# Patient Record
Sex: Male | Born: 1965
Health system: Southern US, Community
[De-identification: ages and names within clinical notes are randomized; demographics above are authoritative.]

## PROBLEM LIST (undated history)

## (undated) DIAGNOSIS — F32A Depression, unspecified: Secondary | ICD-10-CM

## (undated) DIAGNOSIS — M549 Dorsalgia, unspecified: Secondary | ICD-10-CM

## (undated) DIAGNOSIS — N401 Enlarged prostate with lower urinary tract symptoms: Secondary | ICD-10-CM

## (undated) DIAGNOSIS — M545 Low back pain, unspecified: Secondary | ICD-10-CM

## (undated) DIAGNOSIS — M1990 Unspecified osteoarthritis, unspecified site: Secondary | ICD-10-CM

## (undated) DIAGNOSIS — G8929 Other chronic pain: Secondary | ICD-10-CM

## (undated) DIAGNOSIS — K649 Unspecified hemorrhoids: Secondary | ICD-10-CM

## (undated) DIAGNOSIS — F1120 Opioid dependence, uncomplicated: Secondary | ICD-10-CM

## (undated) DIAGNOSIS — F329 Major depressive disorder, single episode, unspecified: Secondary | ICD-10-CM

## (undated) DIAGNOSIS — R7303 Prediabetes: Secondary | ICD-10-CM

## (undated) DIAGNOSIS — D369 Benign neoplasm, unspecified site: Secondary | ICD-10-CM

## (undated) DIAGNOSIS — F419 Anxiety disorder, unspecified: Secondary | ICD-10-CM

## (undated) DIAGNOSIS — I10 Essential (primary) hypertension: Secondary | ICD-10-CM

## (undated) DIAGNOSIS — G5601 Carpal tunnel syndrome, right upper limb: Secondary | ICD-10-CM

## (undated) DIAGNOSIS — K635 Polyp of colon: Secondary | ICD-10-CM

## (undated) HISTORY — DX: Essential (primary) hypertension: I10

## (undated) HISTORY — DX: Benign prostatic hyperplasia with lower urinary tract symptoms: N40.1

## (undated) HISTORY — DX: Low back pain, unspecified: M54.50

## (undated) HISTORY — DX: Major depressive disorder, single episode, unspecified: F32.9

## (undated) HISTORY — DX: Benign neoplasm, unspecified site: D36.9

## (undated) HISTORY — DX: Polyp of colon: K63.5

## (undated) HISTORY — PX: COLONOSCOPY: SHX174

## (undated) HISTORY — DX: Unspecified osteoarthritis, unspecified site: M19.90

## (undated) HISTORY — DX: Unspecified hemorrhoids: K64.9

## (undated) HISTORY — DX: Carpal tunnel syndrome, right upper limb: G56.01

---
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
## 1998-11-23 ENCOUNTER — Emergency Department (HOSPITAL_COMMUNITY): Admission: EM | Admit: 1998-11-23 | Discharge: 1998-11-23 | Payer: Self-pay | Admitting: Emergency Medicine

## 2002-07-14 ENCOUNTER — Emergency Department (HOSPITAL_COMMUNITY): Admission: EM | Admit: 2002-07-14 | Discharge: 2002-07-14 | Payer: Self-pay | Admitting: Emergency Medicine

## 2003-02-18 ENCOUNTER — Emergency Department (HOSPITAL_COMMUNITY): Admission: EM | Admit: 2003-02-18 | Discharge: 2003-02-18 | Payer: Self-pay | Admitting: Emergency Medicine

## 2003-07-11 ENCOUNTER — Emergency Department (HOSPITAL_COMMUNITY): Admission: EM | Admit: 2003-07-11 | Discharge: 2003-07-11 | Payer: Self-pay | Admitting: Emergency Medicine

## 2006-10-22 ENCOUNTER — Encounter
Admission: RE | Admit: 2006-10-22 | Discharge: 2006-10-22 | Payer: Self-pay | Admitting: Physical Medicine and Rehabilitation

## 2007-01-15 HISTORY — PX: CERVICAL SPINE SURGERY: SHX589

## 2007-11-09 ENCOUNTER — Encounter: Admission: RE | Admit: 2007-11-09 | Discharge: 2007-11-09 | Payer: Self-pay | Admitting: Neurological Surgery

## 2008-01-12 ENCOUNTER — Ambulatory Visit (HOSPITAL_COMMUNITY): Admission: RE | Admit: 2008-01-12 | Discharge: 2008-01-13 | Payer: Self-pay | Admitting: Neurological Surgery

## 2008-02-15 ENCOUNTER — Encounter: Admission: RE | Admit: 2008-02-15 | Discharge: 2008-02-15 | Payer: Self-pay | Admitting: Neurological Surgery

## 2008-05-28 ENCOUNTER — Emergency Department (HOSPITAL_COMMUNITY): Admission: EM | Admit: 2008-05-28 | Discharge: 2008-05-28 | Payer: Self-pay | Admitting: Emergency Medicine

## 2010-01-14 HISTORY — PX: FOOT SURGERY: SHX648

## 2010-02-04 ENCOUNTER — Emergency Department (HOSPITAL_COMMUNITY)
Admission: EM | Admit: 2010-02-04 | Discharge: 2010-02-04 | Payer: Self-pay | Source: Home / Self Care | Admitting: Emergency Medicine

## 2010-04-24 LAB — URINALYSIS, ROUTINE W REFLEX MICROSCOPIC
Nitrite: NEGATIVE
Protein, ur: NEGATIVE mg/dL
Urobilinogen, UA: 0.2 mg/dL (ref 0.0–1.0)

## 2010-04-24 LAB — URINE MICROSCOPIC-ADD ON

## 2010-05-29 NOTE — Op Note (Signed)
NAME:  Benjamin Solomon, HOGLUND NO.:  [id number]   MEDICAL RECORD NO.:  [id number]          PATIENT TYPE:  OIB   LOCATION:  3534                         FACILITY:  [facility]   PHYSICIAN:  Tia Alert, MD     DATE OF BIRTH:  May 30, 1965   DATE OF PROCEDURE:  01/12/2008  DATE OF DISCHARGE:                               OPERATIVE REPORT   PREOPERATIVE DIAGNOSIS:  Cervical degenerative disk disease C4-5 with  spondylosis and neural foraminal stenosis with neck and left arm pain.   POSTOPERATIVE DIAGNOSIS:  Cervical degenerative disk disease C4-5 with  spondylosis and neural foraminal stenosis with neck and left arm pain.   PROCEDURES:  1. Decompressive anterior cervical diskectomy C4-5.  2. Anterior cervical arthrodesis C4-5 utilizing an 8-mm PEEK interbody      cage packed with local autograft and Trinity stem cell product.  3. Anterior cervical plating C4-5 utilizing a 24-mm helix plate.   SURGEON:  Tia Alert, MD   ASSISTANT:  Reinaldo Meeker, MD   ANESTHESIA:  General endotracheal.   COMPLICATIONS:  None apparent.   INDICATIONS FOR THE PROCEDURE:  [name] is a 45 year old  gentleman who has a long history of neck pain with radiation into the  left shoulder.  He had MRIs, which showed significant degenerative disk  disease with Modic endplate changes at C4-5 with spondylosis and neural  foraminal stenosis.  He tried medical management for a quite sometime  without significant relief.  I recommended an anterior cervical  diskectomy, fusion, and plating at C4-5.  He understood the risks,  benefits, and expected outcome and wished to proceed.   DESCRIPTION OF THE PROCEDURE:  The patient was taken to the operating  room, and after induction of adequate generalized endotracheal  anesthesia, he was placed in supine position on the operating room  table.  His right anterior cervical region was prepped with DuraPrep and  then draped in usual sterile fashion.  A  5 mL of local anesthesia was  injected, and a transverse incision was made to the right of midline and  carried down to the platysma.  The platysma was opened and undermined  with Metzenbaum scissors.  I then dissected a plane medial to the  sternocleidomastoid muscle and internal carotid artery and lateral to  the trachea and esophagus to expose C4-5.  Intraoperative fluoroscopy  confirmed my level.  Then, I took down the longus colli muscles and  placed the Shadow-Line retractors under this.  The annulus was incised.  The anterior osteophytes were removed, and then the disk space was  prepared with Epstein curettes and then high-speed drill, drilling the  endplates, and widening the disk space from about 2 mm to about 8 mm.  I  drilled down to the level of the posterior osteophytes and posterior  longitudinal ligament.  I was able to undercut the osteophytes and  opened the posterior longitudinal ligament with a nerve hook and then  removed it in a circumferential fashion while undercutting the bodies of  C4 and C5.  There was a large osteophyte coming off of the inferior  endplate of C4 more appeared central to the left.  This was drilled to  thin this out to an eggshell and then removed while undercutting the  body of C4 until the dura was fully decompressed and that we could see  the cord pulsatile through the dura.  We then performed bilateral  foraminotomies.  We then irrigated with saline solution.  We palpated  with a nerve hook into the foramina and into the midline  circumferentially to assure adequate decompression both visually and  with tactile feel with a nerve hook.  We then felt what like I had a  good decompression.  We used an 8-mm PEEK interbody cage, packed with  local autograft and Trinity stem cell product and tapped this into  position at C4-5.  We then used a 24-mm helix plate and placed two 13-mm  variable angle screws in the bodies of C4 and C5.  These locked in  the  plate by locking mechanism within the plate.  We then irrigated with  saline solution, dried all bleeding points with bipolar cautery.  Once  meticulous hemostasis was achieved, closed the platysma with 3-0 Vicryl,  closed the subcuticular tissue with 3-0 Vicryl, and closed the skin with  benzoin and Steri-Strips.  The drapes were removed.  A sterile dressing  was applied.  The patient was awakened from general anesthesia and  transferred to the recovery room in stable condition.  At the end of the  procedure, all sponge, needle, and instrument counts were correct.      Tia Alert, MD  Electronically Signed     DSJ/MEDQ  D:  01/12/2008  T:  01/12/2008  Job:  [id number]

## 2010-08-16 ENCOUNTER — Encounter: Payer: Self-pay | Admitting: *Deleted

## 2010-08-16 ENCOUNTER — Emergency Department (HOSPITAL_COMMUNITY)
Admission: EM | Admit: 2010-08-16 | Discharge: 2010-08-16 | Disposition: A | Discharge status: Home / Self Care | Payer: BC Managed Care – PPO | Attending: Emergency Medicine | Admitting: Emergency Medicine

## 2010-08-16 DIAGNOSIS — I10 Essential (primary) hypertension: Secondary | ICD-10-CM | POA: Insufficient documentation

## 2010-08-16 DIAGNOSIS — M545 Low back pain, unspecified: Secondary | ICD-10-CM | POA: Insufficient documentation

## 2010-08-16 DIAGNOSIS — F17200 Nicotine dependence, unspecified, uncomplicated: Secondary | ICD-10-CM | POA: Insufficient documentation

## 2010-08-16 HISTORY — DX: Essential (primary) hypertension: I10

## 2010-08-16 LAB — URINALYSIS, ROUTINE W REFLEX MICROSCOPIC
Bilirubin Urine: NEGATIVE
Glucose, UA: NEGATIVE mg/dL
Protein, ur: NEGATIVE mg/dL
pH: 6 (ref 5.0–8.0)

## 2010-08-16 LAB — URINE MICROSCOPIC-ADD ON

## 2010-08-16 MED ORDER — PREDNISONE 20 MG PO TABS
60.0000 mg | ORAL_TABLET | Freq: Every day | ORAL | Status: DC
  Administered 2010-08-16: 60 mg via ORAL
  Filled 2010-08-16: qty 3

## 2010-08-16 MED ORDER — MORPHINE SULFATE 10 MG/ML IJ SOLN
4.0000 mg | Freq: Once | INTRAMUSCULAR | Status: AC
  Administered 2010-08-16: 4 mg via INTRAMUSCULAR
  Filled 2010-08-16: qty 1

## 2010-08-16 MED ORDER — CYCLOBENZAPRINE HCL 10 MG PO TABS: 10.0000 mg | ORAL_TABLET | Freq: Two times a day (BID) | ORAL | Status: AC | PRN

## 2010-08-16 MED ORDER — OXYCODONE-ACETAMINOPHEN 5-325 MG PO TABS: 2.0000 | ORAL_TABLET | ORAL | Status: DC | PRN

## 2010-08-16 MED ORDER — PREDNISONE 10 MG PO TABS: 20.0000 mg | ORAL_TABLET | Freq: Every day | ORAL | Status: AC

## 2010-08-16 MED ORDER — HYDROCODONE-ACETAMINOPHEN 5-500 MG PO TABS: 2.0000 | ORAL_TABLET | Freq: Four times a day (QID) | ORAL | Status: AC | PRN

## 2010-08-16 MED ORDER — CYCLOBENZAPRINE HCL 10 MG PO TABS
10.0000 mg | ORAL_TABLET | Freq: Once | ORAL | Status: AC
  Administered 2010-08-16: 10 mg via ORAL
  Filled 2010-08-16: qty 1

## 2010-08-16 NOTE — ED Provider Notes (Signed)
History   Chart scribed for Hilario Quarry, MD by Enos Fling; the patient was seen in room APA19/APA19; this patient's care was started at 11:15 AM.    CSN: [id number] Arrival date & time: 08/16/2010 10:34 AM  Chief Complaint  Patient presents with  . Back Pain   HPI Pt c/o back pain since last night, describes pain as sharp in the middle of his low back, onset while at rest. Denies any injury. Wife states pt c/o pain on his left low back last night but states it is in the middle and left low back today. Pain is constant and non-radiating. No numbness, tingling, weakness, bowel or bladder incontinence, or difficulty walking. Pain is better with bending over. Pt has not tried any pain meds. No dysuria or urinary frequency. H/o back pain several years ago d/t injury, but this is more severe.   Past Medical History  Diagnosis Date  . Hypertension     Past Surgical History  Procedure Date  . Cervical spine surgery     Family History  Problem Relation Age of Onset  . Hypertension Mother   . Hypertension Father     History  Substance Use Topics  . Smoking status: Current Everyday Smoker -- 1.0 packs/day    Types: Cigarettes  . Smokeless tobacco: Not on file  . Alcohol Use: Yes     occasional beer, liquor and wine      Review of Systems 10 Systems reviewed and are negative for acute change except as noted in the HPI.  Physical Exam  BP 151/83  Pulse 85  Temp(Src) 98.4 F (36.9 C) (Oral)  Resp 18  Ht 5\' 8"  (1.727 m)  Wt 155 lb (70.308 kg)  BMI 23.57 kg/m2  SpO2 100%  Physical Exam  Nursing note and vitals reviewed. Constitutional: He is oriented to person, place, and time. He appears well-developed and well-nourished.  HENT:  Head: Normocephalic and atraumatic.  Right Ear: External ear normal.  Left Ear: External ear normal.  Nose: Nose normal.  Mouth/Throat: Oropharynx is clear and moist.  Eyes: Conjunctivae and EOM are normal. Pupils are equal, round, and  reactive to light.  Neck: Normal range of motion. Neck supple.  Cardiovascular: Normal rate, regular rhythm, normal heart sounds and intact distal pulses.   Pulmonary/Chest: Effort normal and breath sounds normal.  Abdominal: Soft. Bowel sounds are normal.  Musculoskeletal: Normal range of motion.       Left lower lumbar tenderness with spasm  Neurological: He is alert and oriented to person, place, and time. He has normal reflexes.       Normal strength and sensation throughout extremities bilaterally; no pronator drift  Skin: Skin is warm and dry.  Psychiatric: He has a normal mood and affect. His behavior is normal. Thought content normal.    ED Course  Procedures  MDM musculoskeletal back pain, no injury; d/c with flexeril, prednisone, and percocet and outpatient f/u  I personally performed the services described in this documentation, which was scribed in my presence. The recorded information has been reviewed and considered. Hilario Quarry, MD      Hilario Quarry, MD 08/16/10 [phone]

## 2010-08-16 NOTE — ED Notes (Signed)
Pt c/o low back pain since last night. States that the pain was in his left side last night. Denies nausea, vomiting, diarrhea or urinary symptoms. Denies injury.

## 2010-10-19 LAB — BASIC METABOLIC PANEL
Glucose, Bld: 117 mg/dL — ABNORMAL HIGH (ref 70–99)
Potassium: 4 mEq/L (ref 3.5–5.1)

## 2010-10-19 LAB — DIFFERENTIAL
Eosinophils Absolute: 0.2 10*3/uL (ref 0.0–0.7)
Eosinophils Relative: 2 % (ref 0–5)
Monocytes Absolute: 0.8 10*3/uL (ref 0.1–1.0)
Monocytes Relative: 7 % (ref 3–12)

## 2010-10-19 LAB — CBC
Hemoglobin: 13.3 g/dL (ref 13.0–17.0)
MCV: 89.1 fL (ref 78.0–100.0)
Platelets: 187 10*3/uL (ref 150–400)
RBC: 4.47 MIL/uL (ref 4.22–5.81)

## 2010-10-19 LAB — PROTIME-INR
INR: 1.1 (ref 0.00–1.49)
Prothrombin Time: 14.5 seconds (ref 11.6–15.2)

## 2011-03-14 ENCOUNTER — Encounter (HOSPITAL_BASED_OUTPATIENT_CLINIC_OR_DEPARTMENT_OTHER): Payer: Self-pay

## 2011-03-14 ENCOUNTER — Emergency Department (INDEPENDENT_AMBULATORY_CARE_PROVIDER_SITE_OTHER): Payer: BC Managed Care – PPO

## 2011-03-14 ENCOUNTER — Emergency Department (HOSPITAL_BASED_OUTPATIENT_CLINIC_OR_DEPARTMENT_OTHER)
Admission: EM | Admit: 2011-03-14 | Discharge: 2011-03-14 | Disposition: A | Discharge status: Home / Self Care | Payer: BC Managed Care – PPO | Attending: Emergency Medicine | Admitting: Emergency Medicine

## 2011-03-14 ENCOUNTER — Other Ambulatory Visit: Payer: Self-pay

## 2011-03-14 DIAGNOSIS — R059 Cough, unspecified: Secondary | ICD-10-CM | POA: Insufficient documentation

## 2011-03-14 DIAGNOSIS — I10 Essential (primary) hypertension: Secondary | ICD-10-CM | POA: Insufficient documentation

## 2011-03-14 DIAGNOSIS — R079 Chest pain, unspecified: Secondary | ICD-10-CM

## 2011-03-14 DIAGNOSIS — R05 Cough: Secondary | ICD-10-CM | POA: Insufficient documentation

## 2011-03-14 DIAGNOSIS — F17200 Nicotine dependence, unspecified, uncomplicated: Secondary | ICD-10-CM

## 2011-03-14 LAB — CBC
HCT: 41.3 % (ref 39.0–52.0)
Hemoglobin: 14.8 g/dL (ref 13.0–17.0)
MCH: 31.4 pg (ref 26.0–34.0)
MCHC: 35.8 g/dL (ref 30.0–36.0)
RBC: 4.72 MIL/uL (ref 4.22–5.81)

## 2011-03-14 LAB — DIFFERENTIAL
Basophils Relative: 0 % (ref 0–1)
Lymphs Abs: 2.7 10*3/uL (ref 0.7–4.0)
Monocytes Absolute: 0.9 10*3/uL (ref 0.1–1.0)
Monocytes Relative: 9 % (ref 3–12)
Neutro Abs: 5.5 10*3/uL (ref 1.7–7.7)

## 2011-03-14 LAB — BASIC METABOLIC PANEL
BUN: 17 mg/dL (ref 6–23)
CO2: 27 mEq/L (ref 19–32)
Chloride: 103 mEq/L (ref 96–112)
Creatinine, Ser: 1 mg/dL (ref 0.50–1.35)
GFR calc Af Amer: >90 mL/min (ref >90)
Glucose, Bld: 98 mg/dL (ref 70–99)

## 2011-03-14 LAB — CARDIAC PANEL(CRET KIN+CKTOT+MB+TROPI): Relative Index: 0.7 (ref 0.0–2.5)

## 2011-03-14 NOTE — ED Provider Notes (Signed)
Medical screening examination/treatment/procedure(s) were performed by non-physician practitioner and as supervising physician I was immediately available for consultation/collaboration.   Kaytlin Burklow A Keyonna Comunale, MD 03/14/11 2358 

## 2011-03-14 NOTE — ED Provider Notes (Signed)
History     CSN: [id number]  Arrival date & time 03/14/11  1527   First MD Initiated Contact with Patient 03/14/11 1556      Chief Complaint  Patient presents with  . Hypertension    (Consider location/radiation/quality/duration/timing/severity/associated sxs/prior treatment) Patient is a 46 y.o. male presenting with hypertension. The history is provided by the patient. No language interpreter was used.  Hypertension This is a chronic problem. The current episode started more than 1 year ago. The problem occurs constantly. The problem has been gradually worsening. Associated symptoms include chest pain and coughing. Pertinent negatives include no diaphoresis. The symptoms are aggravated by nothing. He has tried nothing for the symptoms. The treatment provided no relief.   patient reports he went to [facility] today and was seen for pain in his chest patient reports his EKG was normal however the doctor there told him to come here for further back evaluation of his blood pressure. The notes from [facility] state patient was sent here for high blood pressure and further evaluation of chest pain patient reports he began having pain in his chest on Sunday 224 the pain is constant. He reports there is pain in the left side of his chest when he touches and when he pushes on the left side of his chest. Patient reports the pain is actually improved since it began on on Sunday. He attributes relief to ibuprofen. Patient is concerned about his blood pressure. He denies radiation of pain denies sweating denies nausea area and  Past Medical History  Diagnosis Date  . Hypertension     Past Surgical History  Procedure Date  . Cervical spine surgery     Family History  Problem Relation Age of Onset  . Hypertension Mother   . Hypertension Father     History  Substance Use Topics  . Smoking status: Current Everyday Smoker -- 1.0 packs/day    Types: Cigarettes  . Smokeless tobacco: Not on file    . Alcohol Use: Yes     occasional beer, liquor and wine      Review of Systems  Constitutional: Negative for diaphoresis.  Respiratory: Positive for cough.   Cardiovascular: Positive for chest pain.  All other systems reviewed and are negative.    Allergies  Review of patient's allergies indicates no known allergies.  Home Medications   Current Outpatient Rx  Name Route Sig Dispense Refill  . ASPIRIN 325 MG PO TABS Oral Take 325 mg by mouth daily. Patient used this medication for chest pain.    . IBUPROFEN 200 MG PO TABS Oral Take 400 mg by mouth every 6 (six) hours as needed. Patient used this medication for a headache.    Marland Kitchen LISINOPRIL-HYDROCHLOROTHIAZIDE 10-12.5 MG PO TABS Oral Take 1 tablet by mouth daily.     Marland Kitchen SILDENAFIL CITRATE 100 MG PO TABS Oral Take 100 mg by mouth daily.        BP 150/96  Pulse 112  Temp(Src) 98.1 F (36.7 C) (Oral)  Resp 18  Ht 5\' 8"  (1.727 m)  Wt 160 lb (72.576 kg)  BMI 24.33 kg/m2  SpO2 99%  Physical Exam  Nursing note and vitals reviewed. Constitutional: He is oriented to person, place, and time. He appears well-developed and well-nourished.  HENT:  Head: Normocephalic and atraumatic.  Right Ear: External ear normal.  Left Ear: External ear normal.  Nose: Nose normal.  Mouth/Throat: Oropharynx is clear and moist.  Eyes: Conjunctivae and EOM are normal. Pupils  are equal, round, and reactive to light.  Neck: Normal range of motion. Neck supple.  Cardiovascular: Normal rate and normal heart sounds.   Pulmonary/Chest: Effort normal. He exhibits tenderness.       Left chest wall is tender to palpation  Abdominal: Soft.  Musculoskeletal: Normal range of motion.  Neurological: He is alert and oriented to person, place, and time.  Skin: Skin is warm.  Psychiatric: He has a normal mood and affect.    ED Course  Procedures (including critical care time)  Labs Reviewed - No data to display No results found.   No diagnosis  found.    MDM   Date: 03/14/2011  Rate: 100  Rhythm: normal sinus rhythm  QRS Axis: normal  Intervals: normal  ST/T Wave abnormalities: normal  Conduction Disutrbances:none  Narrative Interpretation:   Old EKG Reviewed: unchanged EKG from [facility] of reviewed shows normal sinus normal EKG, chest x-ray as read by radiologist as normal I discussed the patient with Dr. Golda Acre I obtained  troponin which is less than 0.30. CK is 491 CK-MB is 3.2 patient had decrease in his blood pressure at rest. Counseled patient on findings and followup I think his pain is musculoskeletal. There does not seem to be a cardiac history or evidence of cardiac etiology to patient's current pain he is to followup with his physician for recheck of blood pressure       Langston Masker, [place] 03/14/11 2047  Langston Masker, [place] 03/14/11 2048  Langston Masker, [place] 03/14/11 2048

## 2011-03-14 NOTE — ED Notes (Signed)
Pt c/o Elevated BP.  Was seen at [facility] and sent to ED for BP.  Pt initially seen at [facility] due to CP and nausea.  Those SX no longer present.

## 2011-03-14 NOTE — ED Notes (Signed)
Pt. Walked to restroom and is in no distress with no shortness of breath noted.

## 2011-03-14 NOTE — ED Notes (Signed)
Pt. Has been to [facility] this day with normal EKG done.  Pt. Was told to follow up with a hospital visit due to his B/P being high.

## 2011-03-14 NOTE — Discharge Instructions (Signed)
Hypertension Information As your heart beats, it forces blood through your arteries. This force is your blood pressure. If the pressure is too high, it is called hypertension (HTN) or high blood pressure. HTN is dangerous because you may have it and not know it. High blood pressure may mean that your heart has to work harder to pump blood. Your arteries may be narrow or stiff. The extra work puts you at risk for heart disease, stroke, and other problems.  Blood pressure consists of two numbers, a higher number over a lower, 110/72, for example. It is stated as "110 over 72." The ideal is below 120 for the top number (systolic) and under 80 for the bottom (diastolic).  You should pay close attention to your blood pressure if you have certain conditions such as:  Heart failure.   Prior heart attack.   Diabetes   Chronic kidney disease.   Prior stroke.   Multiple risk factors for heart disease.  To see if you have HTN, your blood pressure should be measured while you are seated with your arm held at the level of the heart. It should be measured at least twice. A one-time elevated blood pressure reading (especially in the Emergency Department) does not mean that you need treatment. There may be conditions in which the blood pressure is different between your right and left arms. It is important to see your caregiver soon for a recheck. Most people have essential hypertension which means that there is not a specific cause. This type of high blood pressure may be lowered by changing lifestyle factors such as:  Stress.   Smoking.   Lack of exercise.   Excessive weight.   Drug/tobacco/alcohol use.   Eating less salt.  Most people do not have symptoms from high blood pressure until it has caused damage to the body. Effective treatment can often prevent, delay or reduce that damage. TREATMENT  Treatment for high blood pressure, when a cause has been identified, is directed at the cause. There  are a large number of medications to treat HTN. These fall into several categories, and your caregiver will help you select the medicines that are best for you. Medications may have side effects. You should review side effects with your caregiver. If your blood pressure stays high after you have made lifestyle changes or started on medicines,   Your medication(s) may need to be changed.   Other problems may need to be addressed.   Be certain you understand your prescriptions, and know how and when to take your medicine.   Be sure to follow up with your caregiver within the time frame advised (usually within two weeks) to have your blood pressure rechecked and to review your medications.   If you are taking more than one medicine to lower your blood pressure, make sure you know how and at what times they should be taken. Taking two medicines at the same time can result in blood pressure that is too low.  Document Released: 03/05/2005 Document Revised: 09/12/2010 Document Reviewed: 03/12/2007 [employer] Patient Information 2012 [employer]. 

## 2011-12-10 ENCOUNTER — Encounter (INDEPENDENT_AMBULATORY_CARE_PROVIDER_SITE_OTHER): Payer: Self-pay | Admitting: Surgery

## 2011-12-16 ENCOUNTER — Ambulatory Visit (INDEPENDENT_AMBULATORY_CARE_PROVIDER_SITE_OTHER): Payer: Self-pay | Admitting: Surgery

## 2011-12-17 ENCOUNTER — Encounter (INDEPENDENT_AMBULATORY_CARE_PROVIDER_SITE_OTHER): Payer: Self-pay | Admitting: Surgery

## 2011-12-17 ENCOUNTER — Ambulatory Visit (INDEPENDENT_AMBULATORY_CARE_PROVIDER_SITE_OTHER): Payer: BC Managed Care – PPO | Admitting: Surgery

## 2011-12-17 VITALS — BP 162/90 | HR 84 | Temp 98.2°F | Resp 16 | Ht 68.0 in | Wt 155.2 lb

## 2011-12-17 DIAGNOSIS — K644 Residual hemorrhoidal skin tags: Secondary | ICD-10-CM

## 2011-12-17 NOTE — Progress Notes (Signed)
Patient ID: Benjamin Solomon, male   DOB: Mar 22, 1965, 46 y.o.   MRN: [id number]  Chief Complaint  Patient presents with  . New Evaluation    eval hems    HPI Benjamin Solomon is a 46 y.o. male.   HPI He is referred by Dr. Elnoria Howard but has actually been seen in our office before for anal problems. He has had a previous Perianal abscess drained. He had problems with external hemorrhoids at that time. He has been having worsening bleeding soaking through this close from an external hemorrhoid. This has improved on its on. The steroid creams did not work. He has pain with bowel movements. He denies incontinence Past Medical History  Diagnosis Date  . Hypertension     Past Surgical History  Procedure Date  . Cervical spine surgery   . Colonoscopy     Family History  Problem Relation Age of Onset  . Hypertension Mother   . Hypertension Father     Social History History  Substance Use Topics  . Smoking status: Current Every Day Smoker -- 1.0 packs/day    Types: Cigarettes  . Smokeless tobacco: Never Used  . Alcohol Use: Yes     Comment: occasional beer, liquor and wine    No Known Allergies  Current Outpatient Prescriptions  Medication Sig Dispense Refill  . lisinopril-hydrochlorothiazide (PRINZIDE,ZESTORETIC) 10-12.5 MG per tablet Take 1 tablet by mouth daily.         Review of Systems Review of Systems  All other systems reviewed and are negative.    Blood pressure 162/90, pulse 84, temperature 98.2 F (36.8 C), temperature source Temporal, resp. rate 16, height 5\' 8"  (1.727 m), weight 155 lb 3.2 oz (70.398 kg).  Physical Exam Physical Exam  Constitutional: He is oriented to person, place, and time. He appears well-developed and well-nourished. No distress.  HENT:  Mouth/Throat: Oropharynx is clear and moist.  Neck: Normal range of motion. Neck supple. No tracheal deviation present. No thyromegaly present.  Cardiovascular: Normal rate, regular rhythm and normal  heart sounds.   No murmur heard. Pulmonary/Chest: Effort normal and breath sounds normal. No respiratory distress. He has no wheezes.  Genitourinary:       There is a large excoriated external hemorrhoid at the anterior midline. He also has an area of significant tenderness on the left lateral area which may represent a developing fistula  Neurological: He is alert and oriented to person, place, and time.  Psychiatric: His behavior is normal. Judgment normal.    Data Reviewed I have reviewed the notes from Dr. Elnoria Howard  Assessment    Bleeding external hemorrhoid    Plan    Exam under anesthesia and external hemorrhoidectomy is recommended. He may also need a fistulotomy depending on the operative findings. I discussed this with him in detail. I discussed the risks of surgery which includes but is not limited to bleeding, infection, recurrence, incontinence, et Karie Soda. He understands and wishes to proceed. Surgery will be scheduled       Morrison Mcbryar A 12/17/2011, 11:02 AM

## 2011-12-19 ENCOUNTER — Ambulatory Visit (HOSPITAL_BASED_OUTPATIENT_CLINIC_OR_DEPARTMENT_OTHER)
Admission: RE | Admit: 2011-12-19 | Discharge: 2011-12-19 | Disposition: A | Discharge status: Home / Self Care | Payer: BC Managed Care – PPO | Source: Ambulatory Visit | Attending: Surgery | Admitting: Surgery

## 2011-12-19 LAB — BASIC METABOLIC PANEL
BUN: 14 mg/dL (ref 6–23)
CO2: 30 mEq/L (ref 19–32)
Calcium: 9.5 mg/dL (ref 8.4–10.5)
Chloride: 100 mEq/L (ref 96–112)
Creatinine, Ser: 1.32 mg/dL (ref 0.50–1.35)
Glucose, Bld: 104 mg/dL — ABNORMAL HIGH (ref 70–99)

## 2011-12-19 NOTE — Progress Notes (Signed)
To come in for bmet-ekg done 3/13

## 2011-12-19 NOTE — H&P (Signed)
  Patient ID: Benjamin Solomon, male DOB: 1965/08/22, 46 y.o. MRN: [id number]  Chief Complaint   Patient presents with   .  New Evaluation     eval hems    HPI  Benjamin Solomon is a 46 y.o. male.  HPI  He is referred by Dr. Elnoria Howard but has actually been seen in our office before for anal problems. He has had a previous Perianal abscess drained. He had problems with external hemorrhoids at that time. He has been having worsening bleeding soaking through this close from an external hemorrhoid. This has improved on its on. The steroid creams did not work. He has pain with bowel movements. He denies incontinence  Past Medical History   Diagnosis  Date   .  Hypertension     Past Surgical History   Procedure  Date   .  Cervical spine surgery    .  Colonoscopy     Family History   Problem  Relation  Age of Onset   .  Hypertension  Mother    .  Hypertension  Father     Social History  History   Substance Use Topics   .  Smoking status:  Current Every Day Smoker -- 1.0 packs/day     Types:  Cigarettes   .  Smokeless tobacco:  Never Used   .  Alcohol Use:  Yes      Comment: occasional beer, liquor and wine    No Known Allergies  Current Outpatient Prescriptions   Medication  Sig  Dispense  Refill   .  lisinopril-hydrochlorothiazide (PRINZIDE,ZESTORETIC) 10-12.5 MG per tablet  Take 1 tablet by mouth daily.      Review of Systems  Review of Systems  All other systems reviewed and are negative.   Blood pressure 162/90, pulse 84, temperature 98.2 F (36.8 C), temperature source Temporal, resp. rate 16, height 5\' 8"  (1.727 m), weight 155 lb 3.2 oz (70.398 kg).  Physical Exam  Physical Exam  Constitutional: He is oriented to person, place, and time. He appears well-developed and well-nourished. No distress.  HENT:  Mouth/Throat: Oropharynx is clear and moist.  Neck: Normal range of motion. Neck supple. No tracheal deviation present. No thyromegaly present.  Cardiovascular: Normal  rate, regular rhythm and normal heart sounds.  No murmur heard.  Pulmonary/Chest: Effort normal and breath sounds normal. No respiratory distress. He has no wheezes.  Genitourinary:  There is a large excoriated external hemorrhoid at the anterior midline. He also has an area of significant tenderness on the left lateral area which may represent a developing fistula  Neurological: He is alert and oriented to person, place, and time.  Psychiatric: His behavior is normal. Judgment normal.   Data Reviewed  I have reviewed the notes from Dr. Elnoria Howard  Assessment   Bleeding external hemorrhoid   Plan   Exam under anesthesia and external hemorrhoidectomy is recommended. He may also need a fistulotomy depending on the operative findings. I discussed this with him in detail. I discussed the risks of surgery which includes but is not limited to bleeding, infection, recurrence, incontinence, et Karie Soda. He understands and wishes to proceed. Surgery will be scheduled

## 2011-12-20 ENCOUNTER — Encounter (HOSPITAL_BASED_OUTPATIENT_CLINIC_OR_DEPARTMENT_OTHER): Payer: Self-pay | Admitting: Anesthesiology

## 2011-12-20 ENCOUNTER — Encounter (HOSPITAL_BASED_OUTPATIENT_CLINIC_OR_DEPARTMENT_OTHER)
Admission: RE | Disposition: A | Discharge status: Home / Self Care | Payer: Self-pay | Source: Ambulatory Visit | Attending: Surgery

## 2011-12-20 ENCOUNTER — Ambulatory Visit (HOSPITAL_BASED_OUTPATIENT_CLINIC_OR_DEPARTMENT_OTHER)
Admission: RE | Admit: 2011-12-20 | Discharge: 2011-12-20 | Disposition: A | Discharge status: Home / Self Care | Payer: BC Managed Care – PPO | Source: Ambulatory Visit | Attending: Surgery | Admitting: Surgery

## 2011-12-20 ENCOUNTER — Encounter (HOSPITAL_BASED_OUTPATIENT_CLINIC_OR_DEPARTMENT_OTHER): Payer: Self-pay | Admitting: *Deleted

## 2011-12-20 ENCOUNTER — Ambulatory Visit (HOSPITAL_BASED_OUTPATIENT_CLINIC_OR_DEPARTMENT_OTHER): Payer: BC Managed Care – PPO | Admitting: Anesthesiology

## 2011-12-20 DIAGNOSIS — F17200 Nicotine dependence, unspecified, uncomplicated: Secondary | ICD-10-CM | POA: Insufficient documentation

## 2011-12-20 DIAGNOSIS — I10 Essential (primary) hypertension: Secondary | ICD-10-CM | POA: Insufficient documentation

## 2011-12-20 DIAGNOSIS — K644 Residual hemorrhoidal skin tags: Secondary | ICD-10-CM

## 2011-12-20 HISTORY — PX: EVALUATION UNDER ANESTHESIA WITH HEMORRHOIDECTOMY: SHX5624

## 2011-12-20 SURGERY — EXAM UNDER ANESTHESIA WITH HEMORRHOIDECTOMY
Anesthesia: General | Site: Rectum | Wound class: Clean Contaminated

## 2011-12-20 MED ORDER — LIDOCAINE 5 % EX OINT: TOPICAL_OINTMENT | CUTANEOUS | Status: DC | PRN

## 2011-12-20 MED ORDER — BUPIVACAINE LIPOSOME 1.3 % IJ SUSP
INTRAMUSCULAR | Status: DC | PRN
  Administered 2011-12-20: 20 mL

## 2011-12-20 MED ORDER — ONDANSETRON HCL 4 MG/2ML IJ SOLN: 4.0000 mg | Freq: Four times a day (QID) | INTRAMUSCULAR | Status: DC | PRN

## 2011-12-20 MED ORDER — KETOROLAC TROMETHAMINE 30 MG/ML IJ SOLN
INTRAMUSCULAR | Status: DC | PRN
  Administered 2011-12-20: 30 mg via INTRAVENOUS

## 2011-12-20 MED ORDER — FENTANYL CITRATE 0.05 MG/ML IJ SOLN: 50.0000 ug | INTRAMUSCULAR | Status: DC | PRN

## 2011-12-20 MED ORDER — MORPHINE SULFATE 4 MG/ML IJ SOLN: 4.0000 mg | INTRAMUSCULAR | Status: DC | PRN

## 2011-12-20 MED ORDER — SODIUM CHLORIDE 0.9 % IV SOLN: 250.0000 mL | INTRAVENOUS | Status: DC | PRN

## 2011-12-20 MED ORDER — SODIUM CHLORIDE 0.9 % IJ SOLN: 3.0000 mL | Freq: Two times a day (BID) | INTRAMUSCULAR | Status: DC

## 2011-12-20 MED ORDER — LIDOCAINE HCL (CARDIAC) 20 MG/ML IV SOLN
INTRAVENOUS | Status: DC | PRN
  Administered 2011-12-20: 50 mg via INTRAVENOUS

## 2011-12-20 MED ORDER — FENTANYL CITRATE 0.05 MG/ML IJ SOLN
25.0000 ug | INTRAMUSCULAR | Status: DC | PRN
  Administered 2011-12-20: 50 ug via INTRAVENOUS
  Administered 2011-12-20: 25 ug via INTRAVENOUS
  Administered 2011-12-20: 50 ug via INTRAVENOUS

## 2011-12-20 MED ORDER — ACETAMINOPHEN 325 MG PO TABS: 650.0000 mg | ORAL_TABLET | ORAL | Status: DC | PRN

## 2011-12-20 MED ORDER — OXYCODONE HCL 5 MG PO TABS: 5.0000 mg | ORAL_TABLET | ORAL | Status: DC | PRN

## 2011-12-20 MED ORDER — ONDANSETRON HCL 4 MG/2ML IJ SOLN
INTRAMUSCULAR | Status: DC | PRN
  Administered 2011-12-20: 4 mg via INTRAVENOUS

## 2011-12-20 MED ORDER — PROPOFOL 10 MG/ML IV BOLUS
INTRAVENOUS | Status: DC | PRN
  Administered 2011-12-20: 275 mg via INTRAVENOUS

## 2011-12-20 MED ORDER — LACTATED RINGERS IV SOLN
INTRAVENOUS | Status: DC
  Administered 2011-12-20 (×2): via INTRAVENOUS

## 2011-12-20 MED ORDER — HYDROCODONE-ACETAMINOPHEN 5-325 MG PO TABS: 1.0000 | ORAL_TABLET | ORAL | Status: AC | PRN

## 2011-12-20 MED ORDER — OXYCODONE HCL 5 MG/5ML PO SOLN: 5.0000 mg | Freq: Once | ORAL | Status: AC | PRN

## 2011-12-20 MED ORDER — CEFAZOLIN SODIUM-DEXTROSE 2-3 GM-% IV SOLR
2.0000 g | INTRAVENOUS | Status: AC
  Administered 2011-12-20: 2 g via INTRAVENOUS

## 2011-12-20 MED ORDER — SODIUM CHLORIDE 0.9 % IJ SOLN: 3.0000 mL | INTRAMUSCULAR | Status: DC | PRN

## 2011-12-20 MED ORDER — FENTANYL CITRATE 0.05 MG/ML IJ SOLN
INTRAMUSCULAR | Status: DC | PRN
  Administered 2011-12-20: 100 ug via INTRAVENOUS

## 2011-12-20 MED ORDER — DEXAMETHASONE SODIUM PHOSPHATE 4 MG/ML IJ SOLN
INTRAMUSCULAR | Status: DC | PRN
  Administered 2011-12-20: 10 mg via INTRAVENOUS

## 2011-12-20 MED ORDER — MIDAZOLAM HCL 5 MG/5ML IJ SOLN
INTRAMUSCULAR | Status: DC | PRN
  Administered 2011-12-20: 2 mg via INTRAVENOUS

## 2011-12-20 MED ORDER — MIDAZOLAM HCL 2 MG/2ML IJ SOLN: 1.0000 mg | INTRAMUSCULAR | Status: DC | PRN

## 2011-12-20 MED ORDER — ACETAMINOPHEN 650 MG RE SUPP: 650.0000 mg | RECTAL | Status: DC | PRN

## 2011-12-20 MED ORDER — OXYCODONE HCL 5 MG PO TABS
5.0000 mg | ORAL_TABLET | Freq: Once | ORAL | Status: AC | PRN
Start: 2011-12-20 — End: 2011-12-20
  Administered 2011-12-20: 5 mg via ORAL

## 2011-12-20 SURGICAL SUPPLY — 41 items
BLADE SURG 15 STRL LF DISP TIS (BLADE) ×1 IMPLANT
BLADE SURG 15 STRL SS (BLADE) ×2
CANISTER SUCTION 1200CC (MISCELLANEOUS) ×2 IMPLANT
CLOTH BEACON ORANGE TIMEOUT ST (SAFETY) ×2 IMPLANT
COVER MAYO STAND STRL (DRAPES) IMPLANT
DECANTER SPIKE VIAL GLASS SM (MISCELLANEOUS) ×1 IMPLANT
DRAPE UTILITY XL STRL (DRAPES) ×2 IMPLANT
DRSG PAD ABDOMINAL 8X10 ST (GAUZE/BANDAGES/DRESSINGS) ×2 IMPLANT
ELECT REM PT RETURN 9FT ADLT (ELECTROSURGICAL) ×2
ELECTRODE REM PT RTRN 9FT ADLT (ELECTROSURGICAL) ×1 IMPLANT
GAUZE SPONGE 4X4 12PLY STRL LF (GAUZE/BANDAGES/DRESSINGS) ×2 IMPLANT
GLOVE SKINSENSE NS SZ7.0 (GLOVE) ×1
GLOVE SKINSENSE STRL SZ7.0 (GLOVE) IMPLANT
GLOVE SURG SIGNA 7.5 PF LTX (GLOVE) ×2 IMPLANT
GOWN PREVENTION PLUS XLARGE (GOWN DISPOSABLE) ×3 IMPLANT
NDL HYPO 25X1 1.5 SAFETY (NEEDLE) ×1 IMPLANT
NDL SAFETY ECLIPSE 18X1.5 (NEEDLE) IMPLANT
NEEDLE HYPO 18GX1.5 SHARP (NEEDLE) ×2
NEEDLE HYPO 22GX1.5 SAFETY (NEEDLE) ×1 IMPLANT
NEEDLE HYPO 25X1 1.5 SAFETY (NEEDLE) ×2 IMPLANT
PACK BASIN DAY SURGERY FS (CUSTOM PROCEDURE TRAY) ×2 IMPLANT
PACK LITHOTOMY IV (CUSTOM PROCEDURE TRAY) ×2 IMPLANT
PENCIL BUTTON HOLSTER BLD 10FT (ELECTRODE) ×1 IMPLANT
SHEARS HARMONIC 9CM CVD (BLADE) ×2 IMPLANT
SHEET MEDIUM DRAPE 40X70 STRL (DRAPES) ×2 IMPLANT
SPONGE GAUZE 4X4 12PLY (GAUZE/BANDAGES/DRESSINGS) ×1 IMPLANT
SPONGE SURGIFOAM ABS GEL 100 (HEMOSTASIS) IMPLANT
SPONGE SURGIFOAM ABS GEL 12-7 (HEMOSTASIS) IMPLANT
SURGILUBE 2OZ TUBE FLIPTOP (MISCELLANEOUS) ×2 IMPLANT
SUT CHROMIC 3 0 SH 27 (SUTURE) ×5 IMPLANT
SYR 20CC LL (SYRINGE) ×1 IMPLANT
SYR CONTROL 10ML LL (SYRINGE) ×2 IMPLANT
TAPE CLOTH SURG 4X10 WHT LF (GAUZE/BANDAGES/DRESSINGS) ×1 IMPLANT
TOWEL OR 17X24 6PK STRL BLUE (TOWEL DISPOSABLE) ×3 IMPLANT
TOWEL OR NON WOVEN STRL DISP B (DISPOSABLE) ×2 IMPLANT
TRAY DSU PREP LF (CUSTOM PROCEDURE TRAY) ×2 IMPLANT
TRAY PROCTOSCOPIC FIBER OPTIC (SET/KITS/TRAYS/PACK) IMPLANT
TUBE CONNECTING 20X1/4 (TUBING) ×2 IMPLANT
UNDERPAD 30X30 INCONTINENT (UNDERPADS AND DIAPERS) ×2 IMPLANT
WATER STERILE IRR 1000ML POUR (IV SOLUTION) ×1 IMPLANT
YANKAUER SUCT BULB TIP NO VENT (SUCTIONS) ×2 IMPLANT

## 2011-12-20 NOTE — Transfer of Care (Signed)
Immediate Anesthesia Transfer of Care Note  Patient: Benjamin Solomon  Procedure(s) Performed: Procedure(s) (LRB) with comments: EXAM UNDER ANESTHESIA WITH HEMORRHOIDECTOMY (N/A) - exam under anesthesia with hemorrhoidectomy  Patient Location: PACU  Anesthesia Type:General  Level of Consciousness: awake  Airway & Oxygen Therapy: Patient Spontanous Breathing and Patient connected to face mask oxygen  Post-op Assessment: Report given to PACU RN and Post -op Vital signs reviewed and stable  Post vital signs: Reviewed and stable  Complications: No apparent anesthesia complications

## 2011-12-20 NOTE — Anesthesia Postprocedure Evaluation (Signed)
Anesthesia Post Note  Patient: Benjamin Solomon  Procedure(s) Performed: Procedure(s) (LRB): EXAM UNDER ANESTHESIA WITH HEMORRHOIDECTOMY (N/A)  Anesthesia type: General  Patient location: PACU  Post pain: Pain level controlled and Adequate analgesia  Post assessment: Post-op Vital signs reviewed, Patient's Cardiovascular Status Stable, Respiratory Function Stable, Patent Airway and Pain level controlled  Last Vitals:  Filed Vitals:   12/20/11 1300  BP: 141/96  Pulse: 81  Temp:   Resp: 15    Post vital signs: Reviewed and stable  Level of consciousness: awake, alert  and oriented  Complications: No apparent anesthesia complications

## 2011-12-20 NOTE — Interval H&P Note (Signed)
History and Physical Interval Note: no change in H and P  12/20/2011 9:09 AM  Benjamin Solomon  has presented today for surgery, with the diagnosis of bleeding external hemorrhoid  The various methods of treatment have been discussed with the patient and family. After consideration of risks, benefits and other options for treatment, the patient has consented to  Procedure(s) (LRB) with comments: EXAM UNDER ANESTHESIA WITH HEMORRHOIDECTOMY (N/A) - exam under anesthesia with hemorrhoidectomy as a surgical intervention .  The patient's history has been reviewed, patient examined, no change in status, stable for surgery.  I have reviewed the patient's chart and labs.  Questions were answered to the patient's satisfaction.     Sibyl Mikula A

## 2011-12-20 NOTE — Op Note (Signed)
EXAM UNDER ANESTHESIA WITH HEMORRHOIDECTOMY  Procedure Note  ORLYN ODONOGHUE 12/20/2011   Pre-op Diagnosis: bleeding external hemorrhoid     Post-op Diagnosis: same  Procedure(s): EXAM UNDER ANESTHESIA WITH SINGLE COLUMN EXTERNAL HEMORRHOIDECTOMY  Surgeon(s): Shelly Rubenstein, MD  Anesthesia: General  Staff:  Aleen Sells, RN - Circulator Dianne Despina Hidden, RN - Scrub Person  Estimated Blood Loss: Minimal               Specimens: hemorrhoid         The patient was brought to the operating room and identified as the correct patient. He was placed on the operating table and general anesthesia was induced. He was then placed in lithotomy position. His perianal area was then prepped and draped in the usual sterile fashion. I anesthetized the perianal area of bupivacaine. I grasped the large external hemorrhoid with an Allis clamp and then completely excised using the Harmonic Scalpel. I closed the small mucosal defect with a 2-0 chromic suture. No other pathology was identified. The hemorrhoid was sent to pathology for evaluation. I injected more bupivacaine around the incision. Gauze and tape were applied. The patient tolerated the procedure well. All counts were correct at the end of the procedure. The patient was then extubated the operating room and taken in stable condition to the recovery period [facility]   Date: 12/20/2011  Time: 11:25 AM

## 2011-12-20 NOTE — Anesthesia Preprocedure Evaluation (Signed)
Anesthesia Evaluation  Patient identified by MRN, date of birth, ID band Patient awake    Reviewed: Allergy & Precautions, H&P , NPO status , Patient's Chart, lab work & pertinent test results  Airway Mallampati: II  Neck ROM: full    Dental   Pulmonary Current Smoker,          Cardiovascular hypertension, + Peripheral Vascular Disease     Neuro/Psych    GI/Hepatic   Endo/Other    Renal/GU      Musculoskeletal   Abdominal   Peds  Hematology   Anesthesia Other Findings   Reproductive/Obstetrics                           Anesthesia Physical Anesthesia Plan  ASA: II  Anesthesia Plan: General   Post-op Pain Management:    Induction: Intravenous  Airway Management Planned: LMA  Additional Equipment:   Intra-op Plan:   Post-operative Plan:   Informed Consent: I have reviewed the patients History and Physical, chart, labs and discussed the procedure including the risks, benefits and alternatives for the proposed anesthesia with the patient or authorized representative who has indicated his/her understanding and acceptance.     Plan Discussed with: CRNA and Surgeon  Anesthesia Plan Comments:         Anesthesia Quick Evaluation

## 2011-12-23 ENCOUNTER — Encounter (HOSPITAL_BASED_OUTPATIENT_CLINIC_OR_DEPARTMENT_OTHER): Payer: Self-pay | Admitting: Surgery

## 2012-01-06 ENCOUNTER — Encounter (INDEPENDENT_AMBULATORY_CARE_PROVIDER_SITE_OTHER): Payer: Self-pay | Admitting: Surgery

## 2012-01-06 ENCOUNTER — Ambulatory Visit (INDEPENDENT_AMBULATORY_CARE_PROVIDER_SITE_OTHER): Payer: BC Managed Care – PPO | Admitting: Surgery

## 2012-01-06 VITALS — BP 142/96 | HR 92 | Temp 99.4°F | Resp 16 | Ht 68.0 in | Wt 154.2 lb

## 2012-01-06 DIAGNOSIS — Z09 Encounter for follow-up examination after completed treatment for conditions other than malignant neoplasm: Secondary | ICD-10-CM

## 2012-01-06 MED ORDER — HYDROCODONE-ACETAMINOPHEN 5-325 MG PO TABS: 1.0000 | ORAL_TABLET | Freq: Four times a day (QID) | ORAL | Status: DC | PRN

## 2012-01-06 NOTE — Progress Notes (Signed)
Subjective:     Patient ID: Benjamin Solomon, male   DOB: [birth year], 46 y.o.   MRN: [id number]  HPI He is here for his first postoperative visit status post hemorrhoidectomy for large bleeding external hemorrhoids.  He still has moderate discomfort with bowel movements. He has had no bleeding.  Review of Systems     Objective:   Physical Exam On exam, the wounds are healing well   The pathology demonstrated only polyploid hemorrhoids with no evidence of malignancy Assessment:     Patient stable postop    Plan:     He will continue his current care. I reviewed his hydrocodone. These should heal up well. I told him to come back and see me in a month if he was not healing

## 2012-02-25 ENCOUNTER — Encounter (INDEPENDENT_AMBULATORY_CARE_PROVIDER_SITE_OTHER): Payer: Self-pay

## 2012-05-03 ENCOUNTER — Encounter (HOSPITAL_COMMUNITY): Payer: Self-pay | Admitting: *Deleted

## 2012-05-03 ENCOUNTER — Emergency Department (HOSPITAL_COMMUNITY)
Admission: EM | Admit: 2012-05-03 | Discharge: 2012-05-03 | Disposition: A | Discharge status: Home / Self Care | Payer: BC Managed Care – PPO | Attending: Emergency Medicine | Admitting: Emergency Medicine

## 2012-05-03 ENCOUNTER — Emergency Department (HOSPITAL_COMMUNITY): Payer: BC Managed Care – PPO

## 2012-05-03 DIAGNOSIS — F17200 Nicotine dependence, unspecified, uncomplicated: Secondary | ICD-10-CM | POA: Insufficient documentation

## 2012-05-03 DIAGNOSIS — R112 Nausea with vomiting, unspecified: Secondary | ICD-10-CM | POA: Insufficient documentation

## 2012-05-03 DIAGNOSIS — Z79899 Other long term (current) drug therapy: Secondary | ICD-10-CM | POA: Insufficient documentation

## 2012-05-03 DIAGNOSIS — M549 Dorsalgia, unspecified: Secondary | ICD-10-CM | POA: Insufficient documentation

## 2012-05-03 DIAGNOSIS — I10 Essential (primary) hypertension: Secondary | ICD-10-CM | POA: Insufficient documentation

## 2012-05-03 LAB — CBC WITH DIFFERENTIAL/PLATELET
Basophils Absolute: 0 10*3/uL (ref 0.0–0.1)
Basophils Relative: 0 % (ref 0–1)
Hemoglobin: 15 g/dL (ref 13.0–17.0)
MCHC: 35.5 g/dL (ref 30.0–36.0)
Neutro Abs: 8.4 10*3/uL — ABNORMAL HIGH (ref 1.7–7.7)
Neutrophils Relative %: 75 % (ref 43–77)
RDW: 14.4 % (ref 11.5–15.5)
WBC: 11.2 10*3/uL — ABNORMAL HIGH (ref 4.0–10.5)

## 2012-05-03 LAB — BASIC METABOLIC PANEL
Chloride: 100 mEq/L (ref 96–112)
GFR calc Af Amer: >90 mL/min (ref >90)
Potassium: 3.6 mEq/L (ref 3.5–5.1)

## 2012-05-03 LAB — URINALYSIS, ROUTINE W REFLEX MICROSCOPIC
Ketones, ur: NEGATIVE mg/dL
Leukocytes, UA: NEGATIVE
Nitrite: NEGATIVE
Protein, ur: NEGATIVE mg/dL
pH: 7 (ref 5.0–8.0)

## 2012-05-03 LAB — URINE MICROSCOPIC-ADD ON

## 2012-05-03 MED ORDER — SODIUM CHLORIDE 0.9 % IV BOLUS (SEPSIS)
1000.0000 mL | Freq: Once | INTRAVENOUS | Status: AC
  Administered 2012-05-03: 1000 mL via INTRAVENOUS

## 2012-05-03 MED ORDER — IBUPROFEN 800 MG PO TABS: 800.0000 mg | ORAL_TABLET | Freq: Three times a day (TID) | ORAL | Status: DC | PRN

## 2012-05-03 MED ORDER — HYDROMORPHONE HCL PF 1 MG/ML IJ SOLN
1.0000 mg | Freq: Once | INTRAMUSCULAR | Status: AC
  Administered 2012-05-03: 1 mg via INTRAVENOUS

## 2012-05-03 MED ORDER — HYDROMORPHONE HCL PF 1 MG/ML IJ SOLN
INTRAMUSCULAR | Status: AC
  Filled 2012-05-03: qty 1

## 2012-05-03 MED ORDER — ONDANSETRON HCL 4 MG/2ML IJ SOLN
4.0000 mg | Freq: Once | INTRAMUSCULAR | Status: AC
  Administered 2012-05-03: 4 mg via INTRAVENOUS
  Filled 2012-05-03: qty 2

## 2012-05-03 MED ORDER — HYDROMORPHONE HCL PF 1 MG/ML IJ SOLN
1.0000 mg | Freq: Once | INTRAMUSCULAR | Status: AC
  Administered 2012-05-03: 1 mg via INTRAVENOUS
  Filled 2012-05-03: qty 1

## 2012-05-03 MED ORDER — OXYCODONE-ACETAMINOPHEN 5-325 MG PO TABS: 1.0000 | ORAL_TABLET | Freq: Four times a day (QID) | ORAL | Status: DC | PRN

## 2012-05-03 NOTE — ED Notes (Signed)
Pt c/o right flank pain that started this am with n/v,

## 2012-05-03 NOTE — ED Notes (Signed)
Pt called out stating Pain continues. EDP notified.

## 2012-05-03 NOTE — ED Provider Notes (Signed)
History     CSN: [id number]  Arrival date & time 05/03/12  1540   First MD Initiated Contact with Patient 05/03/12 1604      Chief Complaint  Patient presents with  . Flank Pain    (Consider location/radiation/quality/duration/timing/severity/associated sxs/prior treatment) Patient is a 47 y.o. male presenting with flank pain.  Flank Pain   Pt reports onset of moderate to severe aching R flank pain several hours ago, associated with nausea and vomiting, pain is non-radiating. Not improved with rest, no hematuria or dysuria. No prior history of same. No fever, falls or injury.  Past Medical History  Diagnosis Date  . Hypertension     Past Surgical History  Procedure Laterality Date  . Cervical spine surgery    . Colonoscopy    . Evaluation under anesthesia with hemorrhoidectomy  12/20/2011    Procedure: EXAM UNDER ANESTHESIA WITH HEMORRHOIDECTOMY;  Surgeon: Shelly Rubenstein, MD;  Location: [facility];  Service: General;  Laterality: N/A;  exam under anesthesia with hemorrhoidectomy    Family History  Problem Relation Age of Onset  . Hypertension Mother   . Hypertension Father     History  Substance Use Topics  . Smoking status: Current Every Day Smoker -- 1.00 packs/day    Types: Cigarettes  . Smokeless tobacco: Never Used  . Alcohol Use: Yes     Comment: occasional beer, liquor and wine      Review of Systems  Genitourinary: Positive for flank pain.   All other systems reviewed and are negative except as noted in HPI.   Allergies  Review of patient's allergies indicates no known allergies.  Home Medications   Current Outpatient Rx  Name  Route  Sig  Dispense  Refill  . lisinopril-hydrochlorothiazide (PRINZIDE,ZESTORETIC) 10-12.5 MG per tablet   Oral   Take 1 tablet by mouth daily.          Marland Kitchen tetrahydrozoline (EYE DROPS) 0.05 % ophthalmic solution   Both Eyes   Place 1 drop into both eyes daily as needed (for irritation).            BP 161/101  Pulse 77  Temp(Src) 97.2 F (36.2 C) (Oral)  Resp 20  Ht 5\' 8"  (1.727 m)  Wt 150 lb (68.04 kg)  BMI 22.81 kg/m2  SpO2 100%  Physical Exam  Nursing note and vitals reviewed. Constitutional: He is oriented to person, place, and time. He appears well-developed and well-nourished.  Uncomfortable appearing  HENT:  Head: Normocephalic and atraumatic.  Eyes: EOM are normal. Pupils are equal, round, and reactive to light.  Neck: Normal range of motion. Neck supple.  Cardiovascular: Normal rate, normal heart sounds and intact distal pulses.   Pulmonary/Chest: Effort normal and breath sounds normal.  Abdominal: Bowel sounds are normal. He exhibits no distension. There is no tenderness.  Genitourinary:  No CVA tenderness  Musculoskeletal: Normal range of motion. He exhibits no edema and no tenderness.  Neurological: He is alert and oriented to person, place, and time. He has normal strength. No cranial nerve deficit or sensory deficit.  Skin: Skin is warm and dry. No rash noted.  Psychiatric: He has a normal mood and affect.    ED Course  Procedures (including critical care time)  Labs Reviewed  URINALYSIS, ROUTINE W REFLEX MICROSCOPIC - Abnormal; Notable for the following:    Hgb urine dipstick SMALL (*)    All other components within normal limits  CBC WITH DIFFERENTIAL - Abnormal; Notable for the  following:    WBC 11.2 (*)    Neutro Abs 8.4 (*)    All other components within normal limits  BASIC METABOLIC PANEL - Abnormal; Notable for the following:    Glucose, Bld 119 (*)    GFR calc non Af Amer 80 (*)    All other components within normal limits  URINE MICROSCOPIC-ADD ON   Ct Abdomen Pelvis Wo Contrast  05/03/2012  *RADIOLOGY REPORT*  Clinical Data: Right flank pain.  The  CT ABDOMEN AND PELVIS WITHOUT CONTRAST  Technique:  Multidetector CT imaging of the abdomen and pelvis was performed following the standard protocol without intravenous contrast.   Comparison: The 05/28/2008  Findings: Lung bases are clear.  No effusions.  Heart is normal size.  Liver, gallbladder, spleen, pancreas, adrenals and kidneys have an unremarkable unenhanced appearance.  No renal or ureteral stones. No hydronephrosis.  Urinary bladder is decompressed, grossly unremarkable.  Appendix is visualized and is normal. Bowel grossly unremarkable. No free fluid, free air, or adenopathy.  No acute bony abnormality.  Moderate atherosclerotic calcifications in the aorta and iliac vessels without aneurysm.  Small scattered inguinal lymph nodes bilaterally, stable since 2010.  IMPRESSION: No renal or ureteral stones.  No hydronephrosis.  No acute findings.   Original Report Authenticated By: Charlett Nose, M.D.      1. Back pain       MDM  Symptoms concerning for renal stone. Will check labs, send for CT.   5:56 PM Pain improved, labs unremarkable and CT neg for stone or other acute process. Pain of unclear etiology may be musculoskeletal in etiology. Will d/c with pain meds, PCP followup.       Hayleigh Bawa B. Bernette Mayers, MD 05/03/12 1757

## 2012-06-24 ENCOUNTER — Encounter (HOSPITAL_COMMUNITY): Payer: Self-pay | Admitting: *Deleted

## 2012-06-24 ENCOUNTER — Emergency Department (HOSPITAL_COMMUNITY)
Admission: EM | Admit: 2012-06-24 | Discharge: 2012-06-25 | Disposition: A | Discharge status: Other Acute Inpatient Hospital | Payer: BC Managed Care – PPO | Attending: Emergency Medicine | Admitting: Emergency Medicine

## 2012-06-24 DIAGNOSIS — F1010 Alcohol abuse, uncomplicated: Secondary | ICD-10-CM | POA: Insufficient documentation

## 2012-06-24 DIAGNOSIS — I10 Essential (primary) hypertension: Secondary | ICD-10-CM | POA: Insufficient documentation

## 2012-06-24 DIAGNOSIS — F17200 Nicotine dependence, unspecified, uncomplicated: Secondary | ICD-10-CM | POA: Insufficient documentation

## 2012-06-24 DIAGNOSIS — F1410 Cocaine abuse, uncomplicated: Secondary | ICD-10-CM | POA: Insufficient documentation

## 2012-06-24 DIAGNOSIS — Z79899 Other long term (current) drug therapy: Secondary | ICD-10-CM | POA: Insufficient documentation

## 2012-06-24 LAB — COMPREHENSIVE METABOLIC PANEL
ALT: 10 U/L (ref 0–53)
AST: 18 U/L (ref 0–37)
CO2: 27 mEq/L (ref 19–32)
Calcium: 9.5 mg/dL (ref 8.4–10.5)
Chloride: 102 mEq/L (ref 96–112)
GFR calc non Af Amer: 78 mL/min — ABNORMAL LOW (ref >90)
Sodium: 139 mEq/L (ref 135–145)

## 2012-06-24 LAB — RAPID URINE DRUG SCREEN, HOSP PERFORMED
Barbiturates: NOT DETECTED
Cocaine: POSITIVE — AB
Tetrahydrocannabinol: NOT DETECTED

## 2012-06-24 LAB — CBC
Platelets: 221 10*3/uL (ref 150–400)
RBC: 4.68 MIL/uL (ref 4.22–5.81)
WBC: 10.6 10*3/uL — ABNORMAL HIGH (ref 4.0–10.5)

## 2012-06-24 MED ORDER — HYDROCHLOROTHIAZIDE 12.5 MG PO CAPS
12.5000 mg | ORAL_CAPSULE | Freq: Every day | ORAL | Status: DC
  Administered 2012-06-24 – 2012-06-25 (×2): 12.5 mg via ORAL
  Filled 2012-06-24 (×2): qty 1

## 2012-06-24 MED ORDER — POTASSIUM CHLORIDE CRYS ER 20 MEQ PO TBCR
40.0000 meq | EXTENDED_RELEASE_TABLET | Freq: Once | ORAL | Status: AC
  Administered 2012-06-24: 40 meq via ORAL
  Filled 2012-06-24: qty 2

## 2012-06-24 MED ORDER — ONDANSETRON HCL 4 MG PO TABS: 4.0000 mg | ORAL_TABLET | Freq: Three times a day (TID) | ORAL | Status: DC | PRN

## 2012-06-24 MED ORDER — IBUPROFEN 200 MG PO TABS: 600.0000 mg | ORAL_TABLET | Freq: Three times a day (TID) | ORAL | Status: DC | PRN

## 2012-06-24 MED ORDER — ALUM & MAG HYDROXIDE-SIMETH 200-200-20 MG/5ML PO SUSP: 30.0000 mL | ORAL | Status: DC | PRN

## 2012-06-24 MED ORDER — LORAZEPAM 1 MG PO TABS
1.0000 mg | ORAL_TABLET | Freq: Three times a day (TID) | ORAL | Status: DC | PRN
  Administered 2012-06-24 – 2012-06-25 (×2): 1 mg via ORAL
  Filled 2012-06-24 (×2): qty 1

## 2012-06-24 MED ORDER — NICOTINE 21 MG/24HR TD PT24
21.0000 mg | MEDICATED_PATCH | Freq: Every day | TRANSDERMAL | Status: DC
  Administered 2012-06-24: 21 mg via TRANSDERMAL
  Filled 2012-06-24 (×2): qty 1

## 2012-06-24 MED ORDER — LISINOPRIL 10 MG PO TABS
10.0000 mg | ORAL_TABLET | Freq: Every day | ORAL | Status: DC
  Administered 2012-06-24 – 2012-06-25 (×2): 10 mg via ORAL
  Filled 2012-06-24 (×2): qty 1

## 2012-06-24 MED ORDER — ZOLPIDEM TARTRATE 5 MG PO TABS: 5.0000 mg | ORAL_TABLET | Freq: Every evening | ORAL | Status: DC | PRN

## 2012-06-24 MED ORDER — LISINOPRIL-HYDROCHLOROTHIAZIDE 10-12.5 MG PO TABS: 1.0000 | ORAL_TABLET | Freq: Every day | ORAL | Status: DC

## 2012-06-24 MED ORDER — POTASSIUM CHLORIDE CRYS ER 20 MEQ PO TBCR
20.0000 meq | EXTENDED_RELEASE_TABLET | Freq: Two times a day (BID) | ORAL | Status: DC
  Administered 2012-06-25: 20 meq via ORAL
  Filled 2012-06-24: qty 1

## 2012-06-24 NOTE — ED Notes (Signed)
ACT paged by Secretary. Security paged to wand pt and family member

## 2012-06-24 NOTE — ED Notes (Signed)
Pt, pt belongings, pt's wife, pt's wife's belongings wanded by security.

## 2012-06-24 NOTE — BH Assessment (Signed)
Assessment Note   Benjamin Solomon is an 47 y.o. male. Pt requests treatment for alcohol and crack cocaine use.  Pt reports he came for help today because he had talked to his wife about it and he is ready to stop using/drinking. Pt drinks 3x week, 4 beers per use.  Crack cocaine use 6x week, $60-70 per time.  Pt does report getting the sweats when he stops drinking but no other withdrawals.  Pt vitals are elevated, both pulse and blood pressure, although pt reports he does have hypertension and has been off his meds for the past two weeks.  Pt denies SI/HI/AV.  Pt does endorse depression and says that he lost a job one year ago, which led to him starting to drink/use again.  Now the substance abuse has led to financial problems and conflict in relationships.    Axis I: alcohol dependence, cocaine dependence Axis II: Deferred Axis III:  Past Medical History  Diagnosis Date  . Hypertension    Axis IV: economic problems, occupational problems and problems with primary support group Axis V: 41-50 serious symptoms  Past Medical History:  Past Medical History  Diagnosis Date  . Hypertension     Past Surgical History  Procedure Laterality Date  . Cervical spine surgery    . Colonoscopy    . Evaluation under anesthesia with hemorrhoidectomy  12/20/2011    Procedure: EXAM UNDER ANESTHESIA WITH HEMORRHOIDECTOMY;  Surgeon: Shelly Rubenstein, MD;  [facility];  Service: General;  Laterality: N/A;  exam under anesthesia with hemorrhoidectomy    Family History:  Family History  Problem Relation Age of Onset  . Hypertension Mother   . Hypertension Father     Social History:  reports that he has been smoking Cigarettes.  He has been smoking about 1.00 pack per day. He has never used smokeless tobacco. He reports that  drinks alcohol. He reports that he uses illicit drugs (Cocaine).  Additional Social History:  Alcohol / Drug Use Pain Medications: Pt  denies Prescriptions: Pt denies Over the Counter: Pt denies History of alcohol / drug use?: Yes Longest period of sobriety (when/how long): none recent Negative Consequences of Use: Financial;Legal;Personal relationships;Work / Mining engineer #1 Name of Substance 1: beer/liquor 1 - Age of First Use: 15 1 - Amount (size/oz): 4 beers 1 - Frequency: 3x week 1 - Duration: 1 year 1 - Last Use / Amount: 6/11, 2 beers Substance #2 Name of Substance 2: crack cocaine 2 - Age of First Use: 33 2 - Amount (size/oz): $60-70 2 - Frequency: 6x week 2 - Duration: 1 year 2 - Last Use / Amount: 6/11, $20  CIWA: CIWA-Ar BP: 162/113 mmHg Pulse Rate: 96 Nausea and Vomiting: no nausea and no vomiting Tactile Disturbances: none Tremor: no tremor Auditory Disturbances: not present Paroxysmal Sweats: no sweat visible Visual Disturbances: not present Anxiety: two Headache, Fullness in Head: none present Agitation: normal activity Orientation and Clouding of Sensorium: oriented and can do serial additions CIWA-Ar Total: 2 COWS:    Allergies: No Known Allergies  Home Medications:  (Not in a hospital admission)  OB/GYN Status:  No LMP for male patient.  General Assessment Data Location of Assessment: [facility] ED ACT Assessment: Yes Living Arrangements: Spouse/significant other;Children Can pt return to current living arrangement?: Yes Admission Status: Voluntary Is patient capable of signing voluntary admission?: Yes Transfer from: Home Referral Source: Self/Family/Friend     Risk to self Suicidal Ideation: No Suicidal Intent: No Is  patient at risk for suicide?: No Suicidal Plan?: No Access to Means: No What has been your use of drugs/alcohol within the last 12 months?: current significant use Previous Attempts/Gestures: No Intentional Self Injurious Behavior: None Family Suicide History: No Recent stressful life event(s): Job Loss;Financial Problems (substance use) Persecutory  voices/beliefs?: No Depression: Yes Depression Symptoms: Insomnia;Fatigue;Guilt;Loss of interest in usual pleasures;Feeling worthless/self pity Substance abuse history and/or treatment for substance abuse?: Yes Suicide prevention information given to non-admitted patients: Not applicable  Risk to Others Homicidal Ideation: No Thoughts of Harm to Others: No Current Homicidal Intent: No Current Homicidal Plan: No Access to Homicidal Means: No History of harm to others?: No Assessment of Violence: None Noted Does patient have access to weapons?: No Criminal Charges Pending?: Yes Describe Pending Criminal Charges: seat belt charge Does patient have a court date: Yes Court Date:  (late July)  Psychosis Hallucinations: None noted Delusions: None noted  Mental Status Report Appear/Hygiene: Other (Comment) (casual) Eye Contact: Good Motor Activity: Unremarkable Speech: Logical/coherent Level of Consciousness: Alert Mood: Depressed Affect: Appropriate to circumstance Anxiety Level: Minimal Thought Processes: Coherent;Relevant Judgement: Unimpaired Orientation: Person;Place;Time;Situation Obsessive Compulsive Thoughts/Behaviors: None  Cognitive Functioning Concentration: Normal Memory: Recent Intact;Remote Intact IQ: Average Insight: Good Impulse Control: Poor Appetite: Poor Weight Loss: 25 (over past year) Weight Gain: 0 Sleep: Decreased Total Hours of Sleep: 4 Vegetative Symptoms: None  ADLScreening [facility] Assessment Services) Patient's cognitive ability adequate to safely complete daily activities?: Yes Patient able to express need for assistance with ADLs?: Yes Independently performs ADLs?: Yes (appropriate for developmental age)  Abuse/Neglect [facility]) Physical Abuse: Denies Verbal Abuse: Denies Sexual Abuse: Denies  Prior Inpatient Therapy Prior Inpatient Therapy: No  Prior Outpatient Therapy Prior Outpatient Therapy: Yes Prior Therapy Dates: 1990s Prior  Therapy Facilty/Provider(s): court ordered classes after DWI twice Reason for Treatment: DWI/alcohol  ADL Screening (condition at time of admission) Patient's cognitive ability adequate to safely complete daily activities?: Yes Patient able to express need for assistance with ADLs?: Yes Independently performs ADLs?: Yes (appropriate for developmental age) Weakness of Legs: None Weakness of Arms/Hands: None  Home Assistive Devices/Equipment Home Assistive Devices/Equipment: None    Abuse/Neglect Assessment (Assessment to be complete while patient is alone) Physical Abuse: Denies Verbal Abuse: Denies Sexual Abuse: Denies Exploitation of patient/patient's resources: Denies Self-Neglect: Denies     Merchant navy officer (For Healthcare) Advance Directive: Patient does not have advance directive;Patient would like information Patient requests advance directive information: Advance directive packet given    Additional Information 1:1 In Past 12 Months?: No CIRT Risk: No Elopement Risk: No Does patient have medical clearance?: Yes     Disposition:  Disposition Initial Assessment Completed for this Encounter: Yes Disposition of Patient: Inpatient treatment program Type of inpatient treatment program: Adult  On Site Evaluation by:   Reviewed with Physician:     Lorri Frederick 06/24/2012 10:51 PM

## 2012-06-24 NOTE — ED Provider Notes (Signed)
  Medical screening examination/treatment/procedure(s) were performed by non-physician practitioner and as supervising physician I was immediately available for consultation/collaboration.    Gerhard Munch, MD 06/24/12 2356

## 2012-06-24 NOTE — ED Notes (Signed)
Act Team at bedside.  

## 2012-06-24 NOTE — ED Provider Notes (Signed)
History     CSN: [id number]  Arrival date & time 06/24/12  2012   First MD Initiated Contact with Patient 06/24/12 2101      Chief Complaint  Patient presents with  . detox drugs and alcohol     (Consider location/radiation/quality/duration/timing/severity/associated sxs/prior treatment) HPI Comments: 47 year old male with a past medical history of hypertension and substance abuse presents to the emergency department with his wife requesting detox from alcohol and cocaine. Patient states he's been using crack cocaine consistently for the past year along with alcohol. Last use was about one hour prior to arrival. About 10 years ago he detox from cocaine on his own, however states he takes it was not as severe and does not feel as if he is on his own at this time. Wife states this is interfering with his daily life and needs help. Also has been off of his blood pressure medications for the past 2 weeks because he has not been back to his primary care physician. He normally takes lisinopril and hydrochlorothiazide. Denies chest pain, shortness of breath, lightheadedness, dizziness, visual disturbance. No other complaints at this time. Denies depression or anxiety. No suicidal or homicidal ideations.  The history is provided by the patient and the spouse.    Past Medical History  Diagnosis Date  . Hypertension     Past Surgical History  Procedure Laterality Date  . Cervical spine surgery    . Colonoscopy    . Evaluation under anesthesia with hemorrhoidectomy  12/20/2011    Procedure: EXAM UNDER ANESTHESIA WITH HEMORRHOIDECTOMY;  Surgeon: Shelly Rubenstein, MD;  Location: [facility];  Service: General;  Laterality: N/A;  exam under anesthesia with hemorrhoidectomy    Family History  Problem Relation Age of Onset  . Hypertension Mother   . Hypertension Father     History  Substance Use Topics  . Smoking status: Current Every Day Smoker -- 1.00 packs/day    Types:  Cigarettes  . Smokeless tobacco: Never Used  . Alcohol Use: Yes     Comment: occasional beer, liquor and wine      Review of Systems  Respiratory: Negative for shortness of breath.   Cardiovascular: Negative for chest pain.  Neurological: Negative for headaches.  Psychiatric/Behavioral: Positive for decreased concentration. Negative for suicidal ideas. The patient is not nervous/anxious.   All other systems reviewed and are negative.    Allergies  Review of patient's allergies indicates no known allergies.  Home Medications   Current Outpatient Rx  Name  Route  Sig  Dispense  Refill  . lisinopril-hydrochlorothiazide (PRINZIDE,ZESTORETIC) 10-12.5 MG per tablet   Oral   Take 1 tablet by mouth daily.            BP 162/113  Pulse 96  Temp(Src) 98.1 F (36.7 C)  Resp 18  SpO2 98%  Physical Exam  Nursing note and vitals reviewed. Constitutional: He is oriented to person, place, and time. He appears well-developed and well-nourished. No distress.  HENT:  Head: Normocephalic and atraumatic.  Mouth/Throat: Oropharynx is clear and moist.  Eyes: Conjunctivae and EOM are normal. Pupils are equal, round, and reactive to light.  Neck: Normal range of motion. Neck supple.  Cardiovascular: Normal rate, regular rhythm and normal heart sounds.   Pulmonary/Chest: Effort normal and breath sounds normal.  Abdominal: Soft. Bowel sounds are normal. There is no tenderness.  Musculoskeletal: Normal range of motion. He exhibits no edema.  Neurological: He is alert and oriented to person, place,  and time.  Skin: Skin is warm and dry. He is not diaphoretic.  Psychiatric: He has a normal mood and affect. His speech is normal and behavior is normal. Thought content normal. He expresses no homicidal and no suicidal ideation.    ED Course  Procedures (including critical care time)  Labs Reviewed  CBC - Abnormal; Notable for the following:    WBC 10.6 (*)    MCHC 37.0 (*)    All other  components within normal limits  COMPREHENSIVE METABOLIC PANEL - Abnormal; Notable for the following:    Potassium 3.1 (*)    Glucose, Bld 107 (*)    GFR calc non Af Amer 78 (*)    All other components within normal limits  ETHANOL - Abnormal; Notable for the following:    Alcohol, Ethyl (B) 26 (*)    All other components within normal limits  URINE RAPID DRUG SCREEN (HOSP PERFORMED) - Abnormal; Notable for the following:    Cocaine POSITIVE (*)    All other components within normal limits   No results found.   1. Cocaine abuse       MDM  47 year old male requesting detox from alcohol and cocaine. Psych labs ordered in triage prior to patient being seen. Awaiting asked to consult. Will be moved to Pod C. K 3.1, potassium given. 9:39 PM I spoke with Tammy Sours from ACT team who will evaluate patient. Medically cleared. 11:55 PM Inpatient treatment recommended by ACT team.  Trevor Mace, PA-C 06/24/12 2355

## 2012-06-24 NOTE — ED Notes (Signed)
The pt wants to be detoxed from alcohol and crack  Cocaine.  His last use of both was one hour ago

## 2012-06-24 NOTE — ED Notes (Signed)
The pt is asleep with the male with him in the bed with him.

## 2012-06-24 NOTE — ED Notes (Signed)
Wife at the bedside.  Patient is calm and cooperative.

## 2012-06-24 NOTE — ED Notes (Signed)
The pt has not had his bp meds for one month

## 2012-06-24 NOTE — ED Notes (Signed)
Pt placed in paper scrubs.

## 2012-06-25 ENCOUNTER — Inpatient Hospital Stay (HOSPITAL_COMMUNITY)
Admission: AD | Admit: 2012-06-25 | Discharge: 2012-06-29 | DRG: 751 | Disposition: A | Discharge status: Home / Self Care | Payer: BC Managed Care – PPO | Source: Intra-hospital | Attending: Psychiatry | Admitting: Psychiatry

## 2012-06-25 ENCOUNTER — Encounter (HOSPITAL_COMMUNITY): Payer: Self-pay

## 2012-06-25 DIAGNOSIS — Z79899 Other long term (current) drug therapy: Secondary | ICD-10-CM

## 2012-06-25 DIAGNOSIS — F3289 Other specified depressive episodes: Secondary | ICD-10-CM | POA: Diagnosis present

## 2012-06-25 DIAGNOSIS — F1420 Cocaine dependence, uncomplicated: Secondary | ICD-10-CM | POA: Diagnosis present

## 2012-06-25 DIAGNOSIS — I10 Essential (primary) hypertension: Secondary | ICD-10-CM | POA: Diagnosis present

## 2012-06-25 DIAGNOSIS — F1020 Alcohol dependence, uncomplicated: Principal | ICD-10-CM | POA: Diagnosis present

## 2012-06-25 DIAGNOSIS — F329 Major depressive disorder, single episode, unspecified: Secondary | ICD-10-CM

## 2012-06-25 LAB — COMPREHENSIVE METABOLIC PANEL
ALT: 9 U/L (ref 0–53)
Albumin: 3.4 g/dL — ABNORMAL LOW (ref 3.5–5.2)
Alkaline Phosphatase: 78 U/L (ref 39–117)
Chloride: 101 mEq/L (ref 96–112)
Glucose, Bld: 104 mg/dL — ABNORMAL HIGH (ref 70–99)
Potassium: 3.7 mEq/L (ref 3.5–5.1)
Sodium: 139 mEq/L (ref 135–145)
Total Protein: 6.9 g/dL (ref 6.0–8.3)

## 2012-06-25 MED ORDER — LOPERAMIDE HCL 2 MG PO CAPS: 2.0000 mg | ORAL_CAPSULE | ORAL | Status: AC | PRN

## 2012-06-25 MED ORDER — PNEUMOCOCCAL VAC POLYVALENT 25 MCG/0.5ML IJ INJ: 0.5000 mL | INJECTION | INTRAMUSCULAR | Status: AC

## 2012-06-25 MED ORDER — CHLORDIAZEPOXIDE HCL 25 MG PO CAPS
25.0000 mg | ORAL_CAPSULE | Freq: Three times a day (TID) | ORAL | Status: AC
  Administered 2012-06-26 (×3): 25 mg via ORAL
  Filled 2012-06-25 (×3): qty 1

## 2012-06-25 MED ORDER — HYDROCHLOROTHIAZIDE 12.5 MG PO CAPS
12.5000 mg | ORAL_CAPSULE | Freq: Every day | ORAL | Status: DC
  Administered 2012-06-26 – 2012-06-29 (×4): 12.5 mg via ORAL
  Filled 2012-06-25 (×7): qty 1

## 2012-06-25 MED ORDER — CHLORDIAZEPOXIDE HCL 25 MG PO CAPS
25.0000 mg | ORAL_CAPSULE | Freq: Every day | ORAL | Status: AC
  Administered 2012-06-28: 25 mg via ORAL
  Filled 2012-06-25: qty 1

## 2012-06-25 MED ORDER — ONDANSETRON 4 MG PO TBDP: 4.0000 mg | ORAL_TABLET | Freq: Four times a day (QID) | ORAL | Status: AC | PRN

## 2012-06-25 MED ORDER — NICOTINE 21 MG/24HR TD PT24
21.0000 mg | MEDICATED_PATCH | Freq: Every day | TRANSDERMAL | Status: DC
  Administered 2012-06-26 – 2012-06-29 (×4): 21 mg via TRANSDERMAL
  Filled 2012-06-25 (×9): qty 1

## 2012-06-25 MED ORDER — THIAMINE HCL 100 MG/ML IJ SOLN
100.0000 mg | Freq: Once | INTRAMUSCULAR | Status: AC
  Administered 2012-06-25: 100 mg via INTRAMUSCULAR

## 2012-06-25 MED ORDER — LISINOPRIL-HYDROCHLOROTHIAZIDE 10-12.5 MG PO TABS: 2.0000 | ORAL_TABLET | Freq: Every day | ORAL | Status: DC

## 2012-06-25 MED ORDER — CHLORDIAZEPOXIDE HCL 25 MG PO CAPS
25.0000 mg | ORAL_CAPSULE | Freq: Four times a day (QID) | ORAL | Status: AC | PRN
  Administered 2012-06-26: 25 mg via ORAL
  Filled 2012-06-25: qty 1

## 2012-06-25 MED ORDER — HYDROXYZINE HCL 25 MG PO TABS
25.0000 mg | ORAL_TABLET | Freq: Four times a day (QID) | ORAL | Status: AC | PRN
  Administered 2012-06-25 – 2012-06-28 (×2): 25 mg via ORAL

## 2012-06-25 MED ORDER — CHLORDIAZEPOXIDE HCL 25 MG PO CAPS
ORAL_CAPSULE | ORAL | Status: AC
  Filled 2012-06-25: qty 1

## 2012-06-25 MED ORDER — TRAZODONE HCL 50 MG PO TABS
50.0000 mg | ORAL_TABLET | Freq: Every evening | ORAL | Status: DC | PRN
  Administered 2012-06-25 – 2012-06-28 (×3): 50 mg via ORAL
  Filled 2012-06-25 (×2): qty 1
  Filled 2012-06-25: qty 28
  Filled 2012-06-25: qty 1
  Filled 2012-06-25: qty 28

## 2012-06-25 MED ORDER — ACETAMINOPHEN 325 MG PO TABS
650.0000 mg | ORAL_TABLET | Freq: Four times a day (QID) | ORAL | Status: DC | PRN
  Administered 2012-06-26 – 2012-06-27 (×2): 650 mg via ORAL

## 2012-06-25 MED ORDER — CHLORDIAZEPOXIDE HCL 25 MG PO CAPS
25.0000 mg | ORAL_CAPSULE | Freq: Four times a day (QID) | ORAL | Status: AC
  Administered 2012-06-25 (×3): 25 mg via ORAL
  Filled 2012-06-25 (×2): qty 1

## 2012-06-25 MED ORDER — CHLORDIAZEPOXIDE HCL 25 MG PO CAPS: 25.0000 mg | ORAL_CAPSULE | Freq: Once | ORAL | Status: DC

## 2012-06-25 MED ORDER — CHLORDIAZEPOXIDE HCL 25 MG PO CAPS
25.0000 mg | ORAL_CAPSULE | ORAL | Status: AC
  Administered 2012-06-27 (×2): 25 mg via ORAL
  Filled 2012-06-25 (×2): qty 1

## 2012-06-25 MED ORDER — ADULT MULTIVITAMIN W/MINERALS CH
1.0000 | ORAL_TABLET | Freq: Every day | ORAL | Status: DC
  Administered 2012-06-25 – 2012-06-29 (×5): 1 via ORAL
  Filled 2012-06-25 (×7): qty 1

## 2012-06-25 MED ORDER — LISINOPRIL 10 MG PO TABS
10.0000 mg | ORAL_TABLET | Freq: Every day | ORAL | Status: DC
  Administered 2012-06-26 – 2012-06-29 (×4): 10 mg via ORAL
  Filled 2012-06-25 (×6): qty 1

## 2012-06-25 MED ORDER — MAGNESIUM HYDROXIDE 400 MG/5ML PO SUSP: 30.0000 mL | Freq: Every day | ORAL | Status: DC | PRN

## 2012-06-25 MED ORDER — ALUM & MAG HYDROXIDE-SIMETH 200-200-20 MG/5ML PO SUSP: 30.0000 mL | ORAL | Status: DC | PRN

## 2012-06-25 MED ORDER — VITAMIN B-1 100 MG PO TABS
100.0000 mg | ORAL_TABLET | Freq: Every day | ORAL | Status: DC
  Administered 2012-06-26 – 2012-06-29 (×4): 100 mg via ORAL
  Filled 2012-06-25 (×6): qty 1

## 2012-06-25 NOTE — Progress Notes (Signed)
47 year old male pt admitted on voluntary basis. Pt presents requesting detox from alcohol and crack cocaine use. Pt reports that it was a joint decision between himself and his wife to come in and to get detoxed. Pt does endorse feelings of depression but denies SI and able to contract for safety on the unit. Pt reports that the substance abuse is leading to financial and relationship problems. Pt does report being married for 22 years and says his wife is his support system. Pt spoke about speaking to the doctor in the morning and deciding whether or not he should go home or seek out a treatment facility. Pt reports that he has never been through detox in the past. Pt was oriented to the unit and safety maintained.

## 2012-06-25 NOTE — ED Notes (Signed)
Breakfast order given to [place] to fax to nutrition.

## 2012-06-25 NOTE — ED Notes (Signed)
Breakfast tray received by patient.

## 2012-06-25 NOTE — ED Notes (Signed)
Pt up to the br.  No complaints

## 2012-06-25 NOTE — Progress Notes (Signed)
Recreation Therapy Notes  Date: 06.12.2014   Time: 3:00pm Location: 300 Hall Dayroom     Group Topic/Focus: Self Esteem  Participation Level: Active  Participation Quality: Appropriate  Affect: Euthymic  Cognitive: Appropriate  Additional Comments: Activity: Grateful Wheel; Explanation: Patients were given a worksheet with "I am Grateful For" written in the center. Patients were asked to identify positive words associated with the following categories: Ashby Dawes, Knowledge/Education, Honesty/Comapssion, This Moment, Family/Friends, Memories, Plants/Animals, Food/Water, Health, Work/Rest/Play, Art/Music, Happiness, Mind/Body/Spirit.  Patient participated in group activity. Patient listened to group discussion about the impact of identifying things we are grateful for to sobriety.   Marykay Lex Neya Creegan, LRT/CTRS  Jearl Klinefelter 06/25/2012 4:57 PM

## 2012-06-25 NOTE — ED Notes (Signed)
Act team unable to wake pt.  He will not get a bed until tomorrow

## 2012-06-25 NOTE — ED Notes (Signed)
The pt remains asleep °

## 2012-06-25 NOTE — ED Notes (Signed)
Per Sue Lush, RN,  [facility], she advised okay for patient to come at 1250.   They are getting in an admission now.

## 2012-06-25 NOTE — ED Provider Notes (Signed)
Patient awaiting placement for detox from alcohol and crack cocaine. Patient sleeping this morning, no overnight complaints or problems.  Filed Vitals:   06/25/12 0609  BP: 131/83  Pulse: 71  Temp: 98.1 F (36.7 C)  Resp: 16     Gilda Crease, MD 06/25/12 [phone]

## 2012-06-25 NOTE — ED Notes (Signed)
Continue to wait for placement in detox

## 2012-06-25 NOTE — BH Assessment (Signed)
Assessment Note   Update:  Received call from Thurman Coyer, [facility], stating pt accepted to [facility] by Dr. Lucianne Muss to Dr. Dub Mikes to bed 304-2 and that pt could be transported to [facility].  Completed support paperwork.  Updated EDP Pollina and ED staff.  ED staff to arrange transport to [facility] as pt is voluntary.    Disposition:  Disposition Initial Assessment Completed for this Encounter: Yes Disposition of Patient: Inpatient treatment program Type of inpatient treatment program: Adult (Pt accepted [facility])  On Site Evaluation by:   Reviewed with Physician:     Caryl Comes 06/25/2012 11:41 AM

## 2012-06-25 NOTE — Tx Team (Signed)
Initial Interdisciplinary Treatment Plan  PATIENT STRENGTHS: (choose at least two) Ability for insight Average or above average intelligence Capable of independent living Supportive family/friends  PATIENT STRESSORS: Substance abuse   PROBLEM LIST: Problem List/Patient Goals Date to be addressed Date deferred Reason deferred Estimated date of resolution  Substance Abuse 06/25/12     Depression 06/25/12                                                DISCHARGE CRITERIA:  Ability to meet basic life and health needs Improved stabilization in mood, thinking, and/or behavior Withdrawal symptoms are absent or subacute and managed without 24-hour nursing intervention  PRELIMINARY DISCHARGE PLAN: Attend aftercare/continuing care group  PATIENT/FAMIILY INVOLVEMENT: This treatment plan has been presented to and reviewed with the patient, Benjamin Solomon, and/or family member, .  The patient and family have been given the opportunity to ask questions and make suggestions.  Benjamin Solomon, [name] 06/25/2012, 6:38 PM

## 2012-06-25 NOTE — ED Notes (Signed)
The tps wife has been asked to leave by security

## 2012-06-25 NOTE — ED Notes (Signed)
The pt is sleeping 

## 2012-06-25 NOTE — ED Notes (Signed)
Patient states "I feel a little bit anxious".  Patient does not appear anxious, he just woke up.  Patient ambulated to restroom and tolerated well.

## 2012-06-25 NOTE — BHH Group Notes (Signed)
[facility] LCSW Group Therapy  06/25/2012 3:30 PM  Type of Therapy:  Group Therapy  Participation Level:  Did Not Attend  Solomon, Benjamin Kortz 06/25/2012, 3:30 PM

## 2012-06-25 NOTE — ED Notes (Signed)
Pt sleeping act team here to see

## 2012-06-26 ENCOUNTER — Encounter (HOSPITAL_COMMUNITY): Payer: Self-pay | Admitting: Psychiatry

## 2012-06-26 DIAGNOSIS — F329 Major depressive disorder, single episode, unspecified: Secondary | ICD-10-CM | POA: Diagnosis present

## 2012-06-26 DIAGNOSIS — F1020 Alcohol dependence, uncomplicated: Secondary | ICD-10-CM | POA: Diagnosis present

## 2012-06-26 DIAGNOSIS — F1420 Cocaine dependence, uncomplicated: Secondary | ICD-10-CM | POA: Diagnosis present

## 2012-06-26 NOTE — Tx Team (Signed)
Interdisciplinary Treatment Plan Update (Adult)  Date: 06/26/2012   Time Reviewed: 11:43 AM  Progress in Treatment:  Attending groups: Yes  Participating in groups:  Yes Taking medication as prescribed: Yes  Tolerating medication: Yes  Family/Significant othe contact made: No  Patient understands diagnosis: Yes, AEB seeking treatment for substance abuse/detox. Discussing patient identified problems/goals with staff: Yes  Medical problems stabilized or resolved: Yes  Denies suicidal/homicidal ideation: Yes  Patient has not harmed self or Others: Yes  New problem(s) identified: n/a   Discharge Plan or Barriers: Pt would like to follow for SA o/p at [facility] resident. Additional comments: Benjamin Solomon is an 47 y.o. male. Pt requests treatment for alcohol and crack cocaine use. Pt reports he came for help today because he had talked to his wife about it and he is ready to stop using/drinking. Pt drinks 3x week, 4 beers per use. Crack cocaine use 6x week, $60-70 per time. Pt does report getting the sweats when he stops drinking but no other withdrawals. Pt vitals are elevated, both pulse and blood pressure, although pt reports he does have hypertension and has been off his meds for the past two weeks. Pt denies SI/HI/AV. Pt does endorse depression and says that he lost a job one year ago, which led to him starting to drink/use again. Now the substance abuse has led to financial problems and conflict in relationships.  Reason for Continuation of Hospitalization: Detox Substance abuse Estimated length of stay: 3-5 days For review of initial/current patient goals, please see plan of care.  Attendees:  Patient:    Family:    Physician: Geoffery Lyons  06/26/2012 11:46 AM   Nursing: Idelia Salm RN 06/26/2012 11:46 AM   Clinical Social Worker Damarion Mendizabal Smart, LCSWA  06/26/2012 11:46 AM   Other: Philippa Chester RN 06/26/2012 11:46 AM   Other:    Other: Massie Kluver, Community Care Coordinator   06/26/2012 11:46 AM   Other:    Scribe for Treatment Team:  Trula Slade LCSWA  06/26/2012 11:46 AM

## 2012-06-26 NOTE — [facility] Group Notes (Signed)
[facility] LCSW Group Therapy  06/26/2012 4:34 PM  Type of Therapy:  Group Therapy  Participation Level:  Minimal  Participation Quality:  Drowsy  Affect:  Blunted  Cognitive:  Appropriate  Insight:  Limited  Engagement in Therapy:  Limited  Modes of Intervention:  Discussion, Education, Exploration, Socialization and Support  Summary of Progress/Problems: Feelings around Relapse. Group members discussed the meaning of relapse and shared personal stories of relapse, how it affected them and others, and how they perceived themselves during this time. Group members were encouraged to identify triggers, warning signs and coping skills used when facing the possibility of relapse. Social supports were discussed and explored in detail. Post Acute Withdrawal Syndrome (handout provided) was introduced and examined. Pt's were encouraged to ask questions, talk about key points associated with PAWS, and process this information in terms of relapse prevention. Delane shared his personal story of relapse with the group and stated that the consequences of addiction were losing friends, family and money. Onalee Hua processed how PAWS negatively impacts his ability to stay clean and identified triggers that he must avoid in the future---negative friends, having access to large sums of money.    Smart, Hazle Ogburn 06/26/2012, 4:34 PM

## 2012-06-26 NOTE — [facility] Group Notes (Signed)
[facility] LCSW Aftercare Discharge Planning Group Note   06/26/2012 10:30 AM  Participation Quality:  Appropriate   Mood/Affect:  Appropriate  Depression Rating:  5-6  Anxiety Rating:  7  Thoughts of Suicide:  No Will you contract for safety?   NA  Current AVH:  No  Plan for Discharge/Comments:  Patient lives in [place] and is interested in IOP at [facility]. Will also follow up at [facility] for Med management. Pt lives with his wife.   Transportation Means: wife  Supports: wife  Counselling psychologist, [place]

## 2012-06-26 NOTE — Progress Notes (Signed)
Patient did attend the evening karaoke group.  

## 2012-06-26 NOTE — H&P (Signed)
Psychiatric Admission Assessment Adult  Patient Identification:  Benjamin Solomon Date of Evaluation:  06/26/2012 Chief Complaint:  ETOH AND COCAINE DEPENDENCE History of Present Illness:: He has been drinking 12-16 ounces beer 4 to 6 a day. Uses cocaine daily. Has been using crack for a year. Alcohol on and off since 16. Has  gotten out of control for a year. Lost a good job. He got another job but los it due to his addiction. Endorses that he craves the cocaine, the alcohol. 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 Has not been able to quit on his own, came for help Elements:  Location:  in patient. Quality:  unable to function. Severity:  severe. Timing:   every day. Duration:  building up last few months. Context:  alcohol, cocaine dependence, substance induced mood disorder. Associated Signs/Synptoms: Depression Symptoms:  depressed mood, anhedonia, fatigue, feelings of worthlessness/guilt, difficulty concentrating, loss of energy/fatigue, disturbed sleep, weight loss, decreased appetite, (Hypo) Manic Symptoms:  Not really Anxiety Symptoms:  Excessive Worry, Psychotic Symptoms:  Paranoia,when using PTSD Symptoms: Negative  Psychiatric Specialty Exam: Physical Exam  Review of Systems  Constitutional: Negative.   HENT: Negative.   Eyes: Negative.   Respiratory: Negative.   Cardiovascular: Negative.   Gastrointestinal: Negative.   Genitourinary: Negative.   Musculoskeletal: Negative.   Skin: Negative.   Neurological: Negative.   Endo/Heme/Allergies: Negative.   Psychiatric/Behavioral: Positive for depression and substance abuse. The patient is nervous/anxious and has insomnia.     Blood pressure 134/88, pulse 85, temperature 99 F (37.2 C), temperature source Oral, resp. rate 16, height 5\' 6"  (1.676 m),  weight 62.143 kg (137 lb), SpO2 97.00%.Body mass index is 22.12 kg/(m^2).  General Appearance: Fairly Groomed  Patent attorney::  Fair  Speech:  Clear and Coherent, Slow and not spontaneous  Volume:  Decreased  Mood:  Depressed  Affect:  Restricted  Thought Process:  Coherent and Goal Directed  Orientation:  Full (Time, Place, and Person)  Thought Content:  worries, concerns  Suicidal Thoughts:  No  Homicidal Thoughts:  No  Memory:  Immediate;   Fair Recent;   Fair Remote;   Fair  Judgement:  Fair  Insight:  Present  Psychomotor Activity:  Restlessness  Concentration:  Fair  Recall:  Fair  Akathisia:  No  Handed:  Right  AIMS (if indicated):     Assets:  Desire for Improvement Housing Social Support Transportation  Sleep:  Number of Hours: 5.25    Past Psychiatric History: Diagnosis: Alcohol Cocaine Dependence, Substance Induced Mood Disorder  Hospitalizations: Denies  Outpatient Care: Denies  Substance Abuse Care:Denies  Self-Mutilation:Denies  Suicidal Attempts:Denies  Violent Behaviors:Denies   Past Medical History:   Past Medical History  Diagnosis Date  . Hypertension    None. Allergies:  No Known Allergies PTA Medications: Prescriptions prior to admission  Medication Sig Dispense Refill  . lisinopril-hydrochlorothiazide (PRINZIDE,ZESTORETIC) 10-12.5 MG per tablet Take 1 tablet by mouth daily.         Previous Psychotropic Medications:  Medication/Dose  Denies               Substance Abuse History in the last 12 months:  yes  Consequences of Substance Abuse: Legal Consequences:  past DWI Blackouts:   Withdrawal Symptoms:   Diaphoresis Tremors  Social History:  reports that he has been  smoking Cigarettes.  He has been smoking about 1.00 pack per day. He has never used smokeless tobacco. He reports that  drinks alcohol. He reports that he uses illicit drugs (Cocaine). Additional Social History:                      Current Place of  Residence:  Lives with wife (rocky due to his using) Place of Birth:   Family Members: Marital Status:  Married Children:  Sons: 22 Nature conservation officer)  Daughters:20 (affecting her) Relationships: Education:  Management consultant Problems/Performance: Religious Beliefs/Practices: It was at one time History of Abuse (Emotional/Phsycial/Sexual) Denies Metallurgist History:  None. Legal History: DWI 3 (15 years ago)  Hobbies/Interests:  Family History:   Family History  Problem Relation Age of Onset  . Hypertension Mother   . Hypertension Father     Results for orders placed during the hospital encounter of 06/25/12 (from the past 72 hour(s))  COMPREHENSIVE METABOLIC PANEL     Status: Abnormal   Collection Time    06/25/12  7:59 PM      Result Value Range   Sodium 139  135 - 145 mEq/L   Potassium 3.7  3.5 - 5.1 mEq/L   Chloride 101  96 - 112 mEq/L   CO2 29  19 - 32 mEq/L   Glucose, Bld 104 (*) 70 - 99 mg/dL   BUN 16  6 - 23 mg/dL   Creatinine, Ser 1.61  0.50 - 1.35 mg/dL   Calcium 9.6  8.4 - 09.6 mg/dL   Total Protein 6.9  6.0 - 8.3 g/dL   Albumin 3.4 (*) 3.5 - 5.2 g/dL   AST 15  0 - 37 U/L   ALT 9  0 - 53 U/L   Alkaline Phosphatase 78  39 - 117 U/L   Total Bilirubin 0.3  0.3 - 1.2 mg/dL   GFR calc non Af Amer 66 (*) >90 mL/min   GFR calc Af Amer 76 (*) >90 mL/min   Comment:            The eGFR has been calculated     using the CKD EPI equation.     This calculation has not been     validated in all clinical     situations.     eGFR's persistently     <90 mL/min signify     possible Chronic Kidney Disease.   Psychological Evaluations:  Assessment:   AXIS I:  Alcohol Dependence, Cocaine Dependence, Substance Induced Mood Disorder AXIS II:  Deferred AXIS III:   Past Medical History  Diagnosis Date  . Hypertension    AXIS IV:  occupational problems and other psychosocial or environmental problems AXIS V:  41-50 serious  symptoms  Treatment Plan/Recommendations:  Supportive approach/coping skills/relapse prevention                                                                 Librium Detox  Reassess and address the co morbidities  Treatment Plan Summary: Daily contact with patient to assess and evaluate symptoms and progress in treatment Medication management Current Medications:  Current Facility-Administered Medications  Medication Dose Route Frequency Provider Last Rate Last Dose  . acetaminophen (TYLENOL) tablet 650 mg  650 mg Oral Q6H PRN Nelly Rout, MD      . alum & mag hydroxide-simeth (MAALOX/MYLANTA) 200-200-20 MG/5ML suspension 30 mL  30 mL Oral Q4H PRN Nelly Rout, MD      . chlordiazePOXIDE (LIBRIUM) capsule 25 mg  25 mg Oral Q6H PRN Nelly Rout, MD      . chlordiazePOXIDE (LIBRIUM) capsule 25 mg  25 mg Oral TID Nelly Rout, MD   25 mg at 06/26/12 4098   Followed by  . [START ON 06/27/2012] chlordiazePOXIDE (LIBRIUM) capsule 25 mg  25 mg Oral BH-qamhs Nelly Rout, MD       Followed by  . [START ON 06/28/2012] chlordiazePOXIDE (LIBRIUM) capsule 25 mg  25 mg Oral Daily Nelly Rout, MD      . hydrochlorothiazide (MICROZIDE) capsule 12.5 mg  12.5 mg Oral Daily Nelly Rout, MD   12.5 mg at 06/26/12 0820  . hydrOXYzine (ATARAX/VISTARIL) tablet 25 mg  25 mg Oral Q6H PRN Nelly Rout, MD   25 mg at 06/25/12 1441  . lisinopril (PRINIVIL,ZESTRIL) tablet 10 mg  10 mg Oral Daily Nelly Rout, MD   10 mg at 06/26/12 0819  . loperamide (IMODIUM) capsule 2-4 mg  2-4 mg Oral PRN Nelly Rout, MD      . magnesium hydroxide (MILK OF MAGNESIA) suspension 30 mL  30 mL Oral Daily PRN Nelly Rout, MD      . multivitamin with minerals tablet 1 tablet  1 tablet Oral Daily Nelly Rout, MD   1 tablet at 06/26/12 0821  . nicotine (NICODERM CQ - dosed in mg/24 hours) patch 21 mg  21 mg Transdermal Daily Rachael Fee, MD   21 mg at  06/26/12 1191  . ondansetron (ZOFRAN-ODT) disintegrating tablet 4 mg  4 mg Oral Q6H PRN Nelly Rout, MD      . pneumococcal 23 valent vaccine (PNU-IMMUNE) injection 0.5 mL  0.5 mL Intramuscular Tomorrow-1000 Rachael Fee, MD      . thiamine (VITAMIN B-1) tablet 100 mg  100 mg Oral Daily Nelly Rout, MD   100 mg at 06/26/12 0820  . traZODone (DESYREL) tablet 50 mg  50 mg Oral QHS PRN,MR X 1 Spencer E Simon, PA-C   50 mg at 06/25/12 2313    Observation Level/Precautions:  Detox 15 minute checks  Laboratory:  As per the ED  Psychotherapy:  Individual/group  Medications:  Librium Detox  Consultations:    Discharge Concerns:    Estimated LOS: 3-5 days  Other:     I certify that inpatient services furnished can reasonably be expected to improve the patient's condition.   Terell Kincy A 6/13/201410:14 AM

## 2012-06-26 NOTE — BHH Suicide Risk Assessment (Signed)
Suicide Risk Assessment  Admission Assessment     Nursing information obtained from:    Demographic factors:    Current Mental Status:    Loss Factors:    Historical Factors:    Risk Reduction Factors:     CLINICAL FACTORS:   Depression:   Comorbid alcohol abuse/dependence Alcohol/Substance Abuse/Dependencies  COGNITIVE FEATURES THAT CONTRIBUTE TO RISK:  Closed-mindedness Polarized thinking Thought constriction (tunnel vision)    SUICIDE RISK:   Moderate:  Frequent suicidal ideation with limited intensity, and duration, some specificity in terms of plans, no associated intent, good self-control, limited dysphoria/symptomatology, some risk factors present, and identifiable protective factors, including available and accessible social support.  PLAN OF CARE: Supportive approach/coping skills/relapse prevention                               Detox with Librium                                Reassess and address the co morbidites  I certify that inpatient services furnished can reasonably be expected to improve the patient's condition.  Stefano Trulson A 06/26/2012, 3:11 PM

## 2012-06-26 NOTE — Progress Notes (Signed)
D: Patient denies SI/HI and A/V hallucinations; patient reports that he requested medication and he is still feeling haeavy or drowsy from the medication; reports appetite is improving; reports energy level is low ; reports ability to pay attention is improving; rates depression as 5/10; rates hopelessness 4/10; rates anxiety as 6/10  A: Monitored q 15 minutes; patient encouraged to attend groups; patient educated about medications; patient given medications per physician orders; patient encouraged to express feelings and/or concerns  R: Patient is anxious but it is inferred but also depressed; patient is cooperative, calm, and pleasant; patient's interaction with staff and peers is appropriate; patient reports " I need help";  patient is taking medications as prescribed and tolerating medications; patient is attending all groups is engaging but forwards little information

## 2012-06-26 NOTE — Progress Notes (Signed)
Adult Psychoeducational Group Note  Date:  06/26/2012 Time:  1:45 PM  Group Topic/Focus:  Relapse Prevention Planning:   The focus of this group is to define relapse and discuss the need for planning to combat relapse.  Participation Level:  Minimal  Participation Quality:  Attentive  Affect:  Depressed  Cognitive:  Appropriate  Insight: Lacking  Engagement in Group:  Engaged  Modes of Intervention:  Discussion and Education  Additional Comments:  Pt attended group but had limited participation.  Reynolds Bowl 06/26/2012, 1:45 PM

## 2012-06-27 DIAGNOSIS — F1420 Cocaine dependence, uncomplicated: Secondary | ICD-10-CM

## 2012-06-27 DIAGNOSIS — F1994 Other psychoactive substance use, unspecified with psychoactive substance-induced mood disorder: Secondary | ICD-10-CM

## 2012-06-27 DIAGNOSIS — F1020 Alcohol dependence, uncomplicated: Principal | ICD-10-CM

## 2012-06-27 MED ORDER — IBUPROFEN 800 MG PO TABS
800.0000 mg | ORAL_TABLET | Freq: Three times a day (TID) | ORAL | Status: DC | PRN
  Administered 2012-06-27 – 2012-06-29 (×6): 800 mg via ORAL
  Filled 2012-06-27 (×6): qty 1

## 2012-06-27 NOTE — [facility] Group Notes (Signed)
[facility] LCSW Group Therapy  Stages of Change 06/27/2012  11:15 AM    Type of Therapy:  Group Therapy  Participation Level:  Minimal  Participation Quality:  Appropriate  Affect:  Appropriate  Cognitive:  Appropriate  Insight:  Developing  Engagement in Therapy:  Developing  Modes of Intervention:  Discussion, Education, Exploration, Problem-Solving, Rapport Building, Support  Summary of Progress/Problems: The topic for today was the stages of changes.  Patient were asked to identify changes they need to make and their motivation and commitment to making changes in their lives.   Patient did not participate in the discussion but listened attentively.    Wynn Banker 06/27/2012  11:15 AM

## 2012-06-27 NOTE — Progress Notes (Signed)
D.  Pt pleasant on approach, main complaint of chronic back pain.  Denies SI/HI/hallucinations at this time.  Positive for evening AA group, interacting appropriately within milieu.  A.  Support and encouragement offered, medication given as ordered for back pain.  R.  Pt remains safe on unit, will continue to monitor.

## 2012-06-27 NOTE — Progress Notes (Signed)
The patient attended the A.A. Meeting on the 300 hallway this evening.  

## 2012-06-27 NOTE — Progress Notes (Signed)
Patient ID: Benjamin Solomon, male   DOB: [birth year], 47 y.o.   MRN: [id number] D: Pt is awake and active on the unit this AM. Pt denies SI/HI and A/V hallucinations. Pt rates their depression at 5 and hopelessness at 5. Pt's most recent CIWA score was 3. Pt mood is depressed and his affect is flat/sad. Pt writes that he plans to "stay clean and healthy." Pt is somewhat isolative, and forwards little to staff. He is attending most groups.   A: Encouraged pt to discuss feelings with staff and administered medication per MD orders. Writer also encouraged pt to participate in groups.  R: Pt is attending groups and tolerating medications well. Writer will continue to monitor. 15 minute checks are ongoing for safety.

## 2012-06-27 NOTE — Progress Notes (Signed)
[facility] MD Progress Note  06/27/2012 1:55 PM Benjamin Solomon  MRN:  [id number] Subjective:  Benjamin Solomon reports that he is making good progress. He continues to experience some withdrawal symptoms including sweats and mild tremors. He endorses cravings for cocaine. He reports that he slept well last night with trazodone. He reports that his appetite is improving. He denies any suicidal or homicidal ideation. He denies any auditory or visual hallucinations. He rates his depression as a 5 on a scale of 1-10 where 10 is the worst, and rates his anxiety as a 7 on a scale of 1-10. He is complaining of back pain for 2 days that Tylenol is not relieving.  Diagnosis:   Axis I: Substance Induced Mood Disorder and Alcohol dependence and cocaine abuse Axis II: Deferred Axis III:  Past Medical History  Diagnosis Date  . Hypertension     ADL's:  Intact  Sleep: Good  Appetite:  Fair  Suicidal Ideation:  Patient denies any thought, plan, or intent Homicidal Ideation:  Patient denies any thought, plan, or intent AEB (as evidenced by):  Psychiatric Specialty Exam: Review of Systems  Constitutional: Negative.   HENT: Negative.   Eyes: Negative.   Respiratory: Negative.   Cardiovascular: Negative.   Gastrointestinal: Negative.   Genitourinary: Negative.   Musculoskeletal: Positive for back pain.  Skin: Negative.   Neurological: Positive for tremors.  Endo/Heme/Allergies: Negative.   Psychiatric/Behavioral: Positive for depression. Negative for suicidal ideas and hallucinations. The patient is nervous/anxious. The patient does not have insomnia.     Blood pressure 144/91, pulse 92, temperature 98.4 F (36.9 C), temperature source Oral, resp. rate 16, height 5\' 6"  (1.676 m), weight 62.143 kg (137 lb), SpO2 97.00%.Body mass index is 22.12 kg/(m^2).  General Appearance: Casual  Eye Contact::  Good  Speech:  Clear and Coherent  Volume:  Decreased  Mood:  Anxious and Depressed  Affect:  Congruent   Thought Process:  Goal Directed  Orientation:  Full (Time, Place, and Person)  Thought Content:  WDL  Suicidal Thoughts:  No  Homicidal Thoughts:  No  Memory:  Immediate;   Good Recent;   Good Remote;   Good  Judgement:  Fair  Insight:  Fair  Psychomotor Activity:  Psychomotor Retardation  Concentration:  Good  Recall:  Good  Akathisia:  No  Handed:  Right  AIMS (if indicated):     Assets:  Communication Skills Desire for Improvement  Sleep:  Number of Hours: 6.75   Current Medications: Current Facility-Administered Medications  Medication Dose Route Frequency Provider Last Rate Last Dose  . acetaminophen (TYLENOL) tablet 650 mg  650 mg Oral Q6H PRN Nelly Rout, MD   650 mg at 06/27/12 4098  . alum & mag hydroxide-simeth (MAALOX/MYLANTA) 200-200-20 MG/5ML suspension 30 mL  30 mL Oral Q4H PRN Nelly Rout, MD      . chlordiazePOXIDE (LIBRIUM) capsule 25 mg  25 mg Oral Q6H PRN Nelly Rout, MD   25 mg at 06/26/12 2203  . chlordiazePOXIDE (LIBRIUM) capsule 25 mg  25 mg Oral BH-qamhs Nelly Rout, MD   25 mg at 06/27/12 1191   Followed by  . [START ON 06/28/2012] chlordiazePOXIDE (LIBRIUM) capsule 25 mg  25 mg Oral Daily Nelly Rout, MD      . hydrochlorothiazide (MICROZIDE) capsule 12.5 mg  12.5 mg Oral Daily Nelly Rout, MD   12.5 mg at 06/27/12 4782  . hydrOXYzine (ATARAX/VISTARIL) tablet 25 mg  25 mg Oral Q6H PRN Nelly Rout, MD  25 mg at 06/25/12 1441  . ibuprofen (ADVIL,MOTRIN) tablet 800 mg  800 mg Oral TID WC PRN Jorje Guild, PA-C      . lisinopril (PRINIVIL,ZESTRIL) tablet 10 mg  10 mg Oral Daily Nelly Rout, MD   10 mg at 06/27/12 2440  . loperamide (IMODIUM) capsule 2-4 mg  2-4 mg Oral PRN Nelly Rout, MD      . magnesium hydroxide (MILK OF MAGNESIA) suspension 30 mL  30 mL Oral Daily PRN Nelly Rout, MD      . multivitamin with minerals tablet 1 tablet  1 tablet Oral Daily Nelly Rout, MD   1 tablet at 06/27/12 [phone]  . nicotine (NICODERM CQ - dosed in  mg/24 hours) patch 21 mg  21 mg Transdermal Daily Rachael Fee, MD   21 mg at 06/27/12 0819  . ondansetron (ZOFRAN-ODT) disintegrating tablet 4 mg  4 mg Oral Q6H PRN Nelly Rout, MD      . thiamine (VITAMIN B-1) tablet 100 mg  100 mg Oral Daily Nelly Rout, MD   100 mg at 06/27/12 0820  . traZODone (DESYREL) tablet 50 mg  50 mg Oral QHS PRN,MR X 1 Kerry Hough, PA-C   50 mg at 06/26/12 2203    Lab Results:  Results for orders placed during the hospital encounter of 06/25/12 (from the past 48 hour(s))  COMPREHENSIVE METABOLIC PANEL     Status: Abnormal   Collection Time    06/25/12  7:59 PM      Result Value Range   Sodium 139  135 - 145 mEq/L   Potassium 3.7  3.5 - 5.1 mEq/L   Chloride 101  96 - 112 mEq/L   CO2 29  19 - 32 mEq/L   Glucose, Bld 104 (*) 70 - 99 mg/dL   BUN 16  6 - 23 mg/dL   Creatinine, Ser 2.53  0.50 - 1.35 mg/dL   Calcium 9.6  8.4 - 66.4 mg/dL   Total Protein 6.9  6.0 - 8.3 g/dL   Albumin 3.4 (*) 3.5 - 5.2 g/dL   AST 15  0 - 37 U/L   ALT 9  0 - 53 U/L   Alkaline Phosphatase 78  39 - 117 U/L   Total Bilirubin 0.3  0.3 - 1.2 mg/dL   GFR calc non Af Amer 66 (*) >90 mL/min   GFR calc Af Amer 76 (*) >90 mL/min   Comment:            The eGFR has been calculated     using the CKD EPI equation.     This calculation has not been     validated in all clinical     situations.     eGFR's persistently     <90 mL/min signify     possible Chronic Kidney Disease.    Physical Findings: AIMS: Facial and Oral Movements Muscles of Facial Expression: None, normal Lips and Perioral Area: None, normal Jaw: None, normal Tongue: None, normal,Extremity Movements Upper (arms, wrists, hands, fingers): None, normal Lower (legs, knees, ankles, toes): None, normal, Trunk Movements Neck, shoulders, hips: None, normal, Overall Severity Severity of abnormal movements (highest score from questions above): None, normal Incapacitation due to abnormal movements: None,  normal Patient's awareness of abnormal movements (rate only patient's report): No Awareness, Dental Status Current problems with teeth and/or dentures?: No Does patient usually wear dentures?: No  CIWA:  CIWA-Ar Total: 3 COWS:     Treatment Plan Summary: Daily contact with  patient to assess and evaluate symptoms and progress in treatment Medication management  Plan: We will order her some ibuprofen 800 mg 3 times a day when necessary for back pain.  Medical Decision Making Problem Points:  Established problem, stable/improving (1) and Review of psycho-social stressors (1) Data Points:  Review or order clinical lab tests (1) Review of medication regiment & side effects (2) Review of new medications or change in dosage (2)  I certify that inpatient services furnished can reasonably be expected to improve the patient's condition.   WATT,ALAN 06/27/2012, 1:55 PM  Reviewed the note and agree with the above plan.  Jacqulyn Cane, M.D.  06/27/2012 10:05 PM

## 2012-06-27 NOTE — Progress Notes (Signed)
Adult Psychoeducational Group Note  Date:  06/27/2012 Time:  0900  Group Topic/Focus:  Health Coping Skills  Participation Level:  Did Not Attend  Pt decided not to attend group this AM.  Barbette Merino, Angelina Neece Shari Prows 06/27/2012, 11:27 AM

## 2012-06-27 NOTE — Progress Notes (Signed)
Psychoeducational Group Note  Date:  06/27/2012 Time:  0945 am  Group Topic/Focus:  Identifying Needs:   The focus of this group is to help patients identify their personal needs that have been historically problematic and identify healthy behaviors to address their needs.  Participation Level:  Minimal  Participation Quality:  Attentive  Affect:  Appropriate  Cognitive:  Alert  Insight:  Limited  Engagement in Group:  Limited  Additional Comments:    Andrena Mews 06/27/2012,2:39 PM

## 2012-06-28 MED ORDER — SERTRALINE HCL 25 MG PO TABS
25.0000 mg | ORAL_TABLET | Freq: Every day | ORAL | Status: DC
  Administered 2012-06-28 – 2012-06-29 (×2): 25 mg via ORAL
  Filled 2012-06-28 (×2): qty 1
  Filled 2012-06-28 (×2): qty 14
  Filled 2012-06-28: qty 1

## 2012-06-28 NOTE — Progress Notes (Signed)
Patient ID: Benjamin Solomon, male   DOB: 1965-09-10, 47 y.o.   MRN: [id number] D)  Was in bed when shift started but was awake, stated was tired wasn't sure he could make it to group, didn't attend this evening.  Did come to med window later for hs meds, remains tired, somewhat achey, affect flat, sad.  Requsested motrin and heat packs at hs for his back. A)  Will continue to monitor for safety, continue POC R)  Safety maintained.

## 2012-06-28 NOTE — Progress Notes (Signed)
Psychoeducational Group Note  Date:  06/28/2012 Time:  0945 am  Group Topic/Focus:  Making Healthy Choices:   The focus of this group is to help patients identify negative/unhealthy choices they were using prior to admission and identify positive/healthier coping strategies to replace them upon discharge.  Participation Level:  Active  Participation Quality:  Appropriate  Affect:  Appropriate  Cognitive:  Alert  Insight:  Improving  Engagement in Group:  Improving  Additional Comments:    Benjamin Solomon 06/28/2012, 10:29 AM

## 2012-06-28 NOTE — Progress Notes (Signed)
Date: 06/27/2012  Time:15:15pm  Group Topic/Focus:  Healthy Communication: The focus of this group is to discuss communication, barriers to communication, as well as healthy ways to communicate with others.  Participation Level: Active  Participation Quality: Appropriate, Sharing and Supportive  Affect: Appropriate  Cognitive: Appropriate  Insight: Appropriate  Engagement in Group: Engaged and Supportive  Modes of Intervention: Discussion, Education and Support  Additional Comments: Pt was very involved, participated and was very engaged with the group.  Taahir Grisby M  06/27/2012, 15:15pm  

## 2012-06-28 NOTE — Progress Notes (Signed)
[facility] MD Progress Note  06/28/2012 1:58 PM Benjamin Solomon  MRN:  [id number] Subjective:  Benjamin Solomon reports that he is doing okay, but he continues to express some depression and anxiety, and rates both as a 5 on a scale of 1-10 where 10 is the worst. He also reports that his back pain is still present, although somewhat relieved by the ibuprofen ordered yesterday. He reports that he is sleeping well and his appetite is good. He continues to have cravings for crack cocaine and is experiencing some tremors. He denies any suicidal or homicidal ideation. He denies any auditory or visual hallucinations. He is interested in attending the chemical dependency intensive outpatient group at [facility] after discharge.  Diagnosis:   Axis I: Substance Induced Mood Disorder and Alcohol dependence and cocaine abuse, rule out depressive disorder Axis II: Deferred Axis III:  Past Medical History  Diagnosis Date  . Hypertension     ADL's:  Intact  Sleep: Good  Appetite:  Good  Suicidal Ideation:  Patient denies any thought, plan, or intent Homicidal Ideation:  Patient denies any thought, plan, or intent AEB (as evidenced by):  Psychiatric Specialty Exam: Review of Systems  Constitutional: Negative.   HENT: Negative.   Eyes: Negative.   Respiratory: Negative.   Cardiovascular: Negative.   Gastrointestinal: Negative.   Genitourinary: Negative.   Musculoskeletal: Positive for back pain.  Skin: Negative.   Neurological: Negative.   Endo/Heme/Allergies: Negative.   Psychiatric/Behavioral: Positive for depression. Negative for suicidal ideas and hallucinations. The patient is nervous/anxious. The patient does not have insomnia.     Blood pressure 145/91, pulse 90, temperature 98.5 F (36.9 C), temperature source Oral, resp. rate 18, height 5\' 6"  (1.676 m), weight 62.143 kg (137 lb), SpO2 97.00%.Body mass index is 22.12 kg/(m^2).  General Appearance: Casual  Eye Contact::  Good   Speech:  Clear and Coherent  Volume:  Decreased  Mood:  Depressed  Affect:  Blunt  Thought Process:  Linear  Orientation:  Full (Time, Place, and Person)  Thought Content:  WDL  Suicidal Thoughts:  No  Homicidal Thoughts:  No  Memory:  Immediate;   Good Recent;   Good Remote;   Good  Judgement:  Fair  Insight:  Fair  Psychomotor Activity:  Decreased  Concentration:  Good  Recall:  Good  Akathisia:  No  Handed:  Right  AIMS (if indicated):     Assets:  Communication Skills Desire for Improvement Social Support  Sleep:  Number of Hours: 5.75   Current Medications: Current Facility-Administered Medications  Medication Dose Route Frequency Provider Last Rate Last Dose  . acetaminophen (TYLENOL) tablet 650 mg  650 mg Oral Q6H PRN Nelly Rout, MD   650 mg at 06/27/12 4098  . alum & mag hydroxide-simeth (MAALOX/MYLANTA) 200-200-20 MG/5ML suspension 30 mL  30 mL Oral Q4H PRN Nelly Rout, MD      . chlordiazePOXIDE (LIBRIUM) capsule 25 mg  25 mg Oral Q6H PRN Nelly Rout, MD   25 mg at 06/26/12 2203  . hydrochlorothiazide (MICROZIDE) capsule 12.5 mg  12.5 mg Oral Daily Nelly Rout, MD   12.5 mg at 06/28/12 0840  . hydrOXYzine (ATARAX/VISTARIL) tablet 25 mg  25 mg Oral Q6H PRN Nelly Rout, MD   25 mg at 06/28/12 0639  . ibuprofen (ADVIL,MOTRIN) tablet 800 mg  800 mg Oral TID WC PRN Jorje Guild, PA-C   800 mg at 06/28/12 0636  . lisinopril (PRINIVIL,ZESTRIL) tablet 10 mg  10 mg Oral  Daily Nelly Rout, MD   10 mg at 06/28/12 0840  . loperamide (IMODIUM) capsule 2-4 mg  2-4 mg Oral PRN Nelly Rout, MD      . magnesium hydroxide (MILK OF MAGNESIA) suspension 30 mL  30 mL Oral Daily PRN Nelly Rout, MD      . multivitamin with minerals tablet 1 tablet  1 tablet Oral Daily Nelly Rout, MD   1 tablet at 06/28/12 0840  . nicotine (NICODERM CQ - dosed in mg/24 hours) patch 21 mg  21 mg Transdermal Daily Rachael Fee, MD   21 mg at 06/28/12 4098  . ondansetron (ZOFRAN-ODT)  disintegrating tablet 4 mg  4 mg Oral Q6H PRN Nelly Rout, MD      . sertraline (ZOLOFT) tablet 25 mg  25 mg Oral Daily Jorje Guild, PA-C      . thiamine (VITAMIN B-1) tablet 100 mg  100 mg Oral Daily Nelly Rout, MD   100 mg at 06/28/12 0841  . traZODone (DESYREL) tablet 50 mg  50 mg Oral QHS PRN,MR X 1 Kerry Hough, PA-C   50 mg at 06/26/12 2203    Lab Results: No results found for this or any previous visit (from the past 48 hour(s)).  Physical Findings: AIMS: Facial and Oral Movements Muscles of Facial Expression: None, normal Lips and Perioral Area: None, normal Jaw: None, normal Tongue: None, normal,Extremity Movements Upper (arms, wrists, hands, fingers): None, normal Lower (legs, knees, ankles, toes): None, normal, Trunk Movements Neck, shoulders, hips: None, normal, Overall Severity Severity of abnormal movements (highest score from questions above): None, normal Incapacitation due to abnormal movements: None, normal Patient's awareness of abnormal movements (rate only patient's report): No Awareness, Dental Status Current problems with teeth and/or dentures?: No Does patient usually wear dentures?: No  CIWA:  CIWA-Ar Total: 2 COWS:     Treatment Plan Summary: Daily contact with patient to assess and evaluate symptoms and progress in treatment Medication management  Plan: We will start him on Zoloft 25 mg daily. We will talk to and Evans and see if she can visit the patient to discuss the IOP program before he is discharged from inpatient.  Medical Decision Making Problem Points:  Established problem, stable/improving (1) and Review of last therapy session (1) Data Points:  Review or order clinical lab tests (1) Review of new medications or change in dosage (2)  I certify that inpatient services furnished can reasonably be expected to improve the patient's condition.   WATT,ALAN 06/28/2012, 1:58 PM  Reviewed note, agree with findings and plan. Jacqulyn Cane,  M.D.  06/28/2012 10:33 PM

## 2012-06-28 NOTE — Progress Notes (Signed)
D    Pt has been quiet and calm   He has attended groups and is treatment compliant   He denies suicidal and homicidal ideation  He reports fair appetite  A    Verbal support given   Medications administered and effectiveness monitored   Q 15 min checks R   Pt safe at present
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
## 2012-06-28 NOTE — Progress Notes (Signed)
Patient did attend the evening speaker AA meeting.  

## 2012-06-28 NOTE — Progress Notes (Signed)
Psychoeducational Group Note  Date:  06/27/2012 Time: 2100 Group Topic/Focus:  wrap up group  Participation Level: Did Not Attend  Participation Quality:  Not Applicable  Affect:  Not Applicable  Cognitive:  Not Applicable  Insight:  Not Applicable  Engagement in Group: Not Applicable  Additional Comments:  Pt remained in bed sleeping.  Benjamin Solomon 06/28/2012, 2:00 AM

## 2012-06-28 NOTE — [facility] Group Notes (Signed)
[facility] LCSW Group Therapy  06/28/2012  2:00  Type of Therapy:  Group Therapy  Participation Level:  Limited  Participation Quality:  Attentive  Affect:  Appropriate  Cognitive:  Oriented  Insight:  Limited  Engagement in Therapy:  Engaged  Modes of Intervention:  Discussion, Exploration and Socialization  Summary of Progress/Problems:   The main focus of today's process group was to identify the patient's current support system and decide on other supports that can be put in place. Four definitions/levels of support were discussed and an exercise was utilized to show how much stronger we become with additional supports. An emphasis was placed on using counselor, doctor, therapy groups, 12-step groups, and problem-specific support groups to expand supports, as well as doing something different than has been done before. Earlene Plater is a reluctant group member, with little to say, even when addressed directly.  He admits he was not using his supports prior to admission, and plans to expand his support base by following up with an IOP program.  Richelle Ito, LCSW 06/28/2012 2:43 PM

## 2012-06-29 MED ORDER — LISINOPRIL-HYDROCHLOROTHIAZIDE 10-12.5 MG PO TABS: 1.0000 | ORAL_TABLET | Freq: Every day | ORAL | Status: DC

## 2012-06-29 MED ORDER — TRAZODONE HCL 50 MG PO TABS: 50.0000 mg | ORAL_TABLET | Freq: Every evening | ORAL | Status: DC | PRN

## 2012-06-29 MED ORDER — SERTRALINE HCL 25 MG PO TABS: 25.0000 mg | ORAL_TABLET | Freq: Every day | ORAL | Status: DC

## 2012-06-29 NOTE — Discharge Summary (Signed)
Physician Discharge Summary Note  Patient:  Benjamin Solomon is an 47 y.o., male MRN:  [id number] DOB:  25-Jul-1965 Patient phone:  [phone] (home)  Patient address:   [address] [place] [address],   Date of Admission:  06/25/2012 Date of Discharge: 06/29/12  Reason for Admission:  Alcohol detox  Discharge Diagnoses: Active Problems:   Alcohol dependence   Cocaine dependence   Depressive disorder, not elsewhere classified  Review of Systems  Constitutional: Negative.   HENT: Negative.   Eyes: Negative.   Respiratory: Negative.   Cardiovascular: Negative.   Gastrointestinal: Negative.   Genitourinary: Negative.   Skin: Negative.   Neurological: Negative.   Endo/Heme/Allergies: Negative.   Psychiatric/Behavioral: Positive for depression (Stabilized with medication prior to discharge) and substance abuse (Hx alcoholism). Negative for suicidal ideas, hallucinations and memory loss. The patient has insomnia (Stabilized with medication prior to discharge). The patient is not nervous/anxious.    Axis Diagnosis:   AXIS I:  Alcohol dependence, Cocaine dependence AXIS II:  Deferred AXIS III:   Past Medical History  Diagnosis Date  . Hypertension    AXIS IV:  Alcoholism AXIS V:  64  Level of Care:  OP  Hospital Course:  He has been drinking 12-16 ounces beer 4 to 6 a day. Uses cocaine daily. Has been using crack for a year. Alcohol on and off since 16. Has gotten out of control for a year. Lost a good job. He got another job but los it due to his addiction. Endorses that he craves the cocaine, the alcohol. 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 Has not been able to quit on his own, came for help.  Upon admission into this hospital, and after admission assessment/evaluation coupled with UDS/Toxicology reports,  it was determined that Benjamin Solomon will need detoxification treatment protocol to stabilize his system of /alcohol/drug intoxication and to combat the withdrawal symptoms of these substances as well.  He was then started on Librium treatment protocol. He was also enrolled in group counseling sessions and activities where he counseled, taught and learned coping skills that should help him after discharge to cope better and manage his substance abuse issues to sustain a much longer sobriety. He also attended AA/NA meetings being offered and held on this unit. He has some previously existing and or identifiable medical conditions that required treatment and or monitoring. He received medication management for all those health issues as well. He was monitored closely for any potential problems that may arise as a result of and or during detoxification treatment. Patient tolerated her treatment regimen and detoxification treatment protocol without any significant adverse effects and or reactions presented.  Patient attended treatment team meeting this am and met with the treatment team members. His reason for admission, present symptoms, substance abuse issues, response to treatment and discharge plans discussed. Patient endorsed that he is doing well and stable for discharge to pursue the next phase of his substance abuse treatment. It was then agreed upon that he will follow-up care at the the [facility] outpatient clinic in [place], [place] on 07/01/12 with peggy at 09:45 am for counseling sessions and with Jorje Guild at the [facility] outpatient clinic in [place], on 08/04/12 at 04:00 pm. The addresses, dates, times and contact information for these clinics provided for patient in writing.  Besides the treatments received here and scheduled outpatient psychiatric services , patient was also ordered and received Sertraline 25 mg daily for depression and Trazodone 50 mg for sleep. He was also  encouraged  to join/attend AA/NA meetings offered and held within his community. He was instructed to get a trusted sponsor from the advise of others or from whomever within the AA meetings seems to make sense, and has a proven track record, and will hold him responsible for hhis sobriety,   Upon discharge, patient adamantly denies suicidal, homicidal ideations, auditory, visual hallucinations, delusional thougts and or withdrawal symptoms. Patient left [facility] with all personal belongings in no apparent distress. He received 2 weeks worth samples of his [facility] discharge medications provided by [facility] pharmacy. Transportation per family.   Consults:  psychiatry  Significant Diagnostic Studies:  labs: CBC withh diff, CMP, UDS, Toxicology tests, U/A  Discharge Vitals:   Blood pressure 139/88, pulse 101, temperature 98.1 F (36.7 C), temperature source Oral, resp. rate 18, height 5\' 6"  (1.676 m), weight 62.143 kg (137 lb), SpO2 97.00%. Body mass index is 22.12 kg/(m^2). Lab Results:   No results found for this or any previous visit (from the past 72 hour(s)).  Physical Findings: AIMS: Facial and Oral Movements Muscles of Facial Expression: None, normal Lips and Perioral Area: None, normal Jaw: None, normal Tongue: None, normal,Extremity Movements Upper (arms, wrists, hands, fingers): None, normal Lower (legs, knees, ankles, toes): None, normal, Trunk Movements Neck, shoulders, hips: None, normal, Overall Severity Severity of abnormal movements (highest score from questions above): None, normal Incapacitation due to abnormal movements: None, normal Patient's awareness of abnormal movements (rate only patient's report): No Awareness, Dental Status Current problems with teeth and/or dentures?: No Does patient usually wear dentures?: No  CIWA:  CIWA-Ar Total: 0 COWS:     Psychiatric Specialty Exam: See Psychiatric Specialty Exam and Suicide Risk Assessment completed by Attending Physician prior to  discharge.  Discharge destination:  Home  Is patient on multiple antipsychotic therapies at discharge:  No   Has Patient had three or more failed trials of antipsychotic monotherapy by history:  No  Recommended Plan for Multiple Antipsychotic Therapies: NA     Medication List    TAKE these medications     Indication   lisinopril-hydrochlorothiazide 10-12.5 MG per tablet  Commonly known as:  PRINZIDE,ZESTORETIC  Take 1 tablet by mouth daily. For high blood pressure control   Indication:  High Blood Pressure     sertraline 25 MG tablet  Commonly known as:  ZOLOFT  Take 1 tablet (25 mg total) by mouth daily. For depression   Indication:  Major Depressive Disorder     traZODone 50 MG tablet  Commonly known as:  DESYREL  Take 1 tablet (50 mg total) by mouth at bedtime as needed and may repeat dose one time if needed for sleep. For sleep   Indication:  Trouble Sleeping       Follow-up Information   Follow up with [facility] On 07/01/2012. (Appt. with Peggy at 9:45AM for therapy services.)    Contact information:   [address] [place], [place] [address] phone: [phone]       Follow up with [facility]  On 08/04/2012. (Appt. at 4:00 with Jorje Guild for medication managment.  You will need to come for medication management to this location until psychiatrist available at [facility] in [place]. )    Contact information:   [address]  [place], [place] [address] phone: [phone]      Follow-up recommendations:  Activity:  As tolerated Diet: As recommended by your primary care doctor. Keep all scheduled follow-up appointments as recommended.  Comments:  Take all your medications as prescribed by your mental healthcare provider. Report any adverse effects and or reactions from your medicines to your outpatient provider promptly. Patient is instructed and cautioned to not engage in alcohol and or  illegal drug use while on prescription medicines. In the event of worsening symptoms, patient is instructed to call the crisis hotline, 911 and or go to the nearest ED for appropriate evaluation and treatment of symptoms. Follow-up with your primary care provider for your other medical issues, concerns and or health care needs.   Total Discharge Time:  Greater than 30 minutes.  SignedSanjuana Kava, PMHNP-BC 06/29/2012, 3:34 PM

## 2012-06-29 NOTE — Progress Notes (Signed)
[facility] Adult Case Management Discharge Plan :  Will you be returning to the same living situation after discharge: Yes,  home in [place] At discharge, do you have transportation home?:Yes,  family Do you have the ability to pay for your medications:Yes,  BCBS  Release of information consent forms completed and in the chart;  Patient's signature needed at discharge.  Patient to Follow up at: Follow-up Information   Follow up with [facility] On 07/01/2012. (Appt. with Peggy at 9:45AM for therapy services.)    Contact information:   [address] [place], [place] [address] phone: [phone]       Follow up with [facility]  On 08/04/2012. (Appt. at 4:00 with Jorje Guild for medication managment.  You will need to come for medication management to this location until psychiatrist available at [facility] in [place]. )    Contact information:   [address]  [place], [place] [address] phone: [phone]       Patient denies SI/HI:   Yes,  in group    Safety Planning and Suicide Prevention discussed:  Yes,  attempts made to contact pt's wife. SPE gone over with pt and pt given SPI pamplet to share with his family  Smart, Herbert Seta 06/29/2012, 11:33 AM

## 2012-06-29 NOTE — [facility] Suicide Risk Assessment (Deleted)
[facility] INPATIENT: Family/Significant Other Suicide Prevention Education  Suicide Prevention Education:  Education Completed; No one has been identified by the patient as the family member/significant other with whom the patient will be residing, and identified as the person(s) who will aid the patient in the event of a mental health crisis (suicidal ideations/suicide attempt). With written consent from the patient, the family member/significant other has been provided the following suicide prevention education, prior to the and/or following the discharge of the patient.  The suicide prevention education provided includes the following:  Suicide risk factors  Suicide prevention and interventions  National Suicide Hotline telephone number  [facility] assessment telephone number  [facility] Emergency Assistance 911  [facility] and/or Residential Mobile Crisis Unit telephone number Request made of family/significant other to:  Remove weapons (e.g., guns, rifles, knives), all items previously/currently identified as safety concern.  Remove drugs/medications (over-the-counter, prescriptions, illicit drugs), all items previously/currently identified as a safety concern. The family member/significant other verbalizes understanding of the suicide prevention education information provided. The family member/significant other agrees to remove the items of safety concern listed above. Pt did not c/o SI at admission, nor have they endorsed SI during their stay here. SPE not required.  [employer], LCSWA  06/29/2012 11:12 AM

## 2012-06-29 NOTE — Progress Notes (Signed)
D.  Pt pleasant on approach, still complaint of back pain treated with Ibuprofen. Pt also states that his right ankle is somewhat swollen and he is unsure of why.  Pt states that it does not hurt.  Positive for evening AA group, interacting appropriately within milieu.  Denies SI/HI/hallucinations at this time.  A.  Support and encouragement offered, made note for doctor/NP about Pt's ankle, advised Pt to elevate it tonight.  R.  Pt remains safe, will continue to monitor.

## 2012-06-29 NOTE — Tx Team (Signed)
Interdisciplinary Treatment Plan Update (Adult)  Date: 06/29/2012  Time Reviewed: 11:24 AM  Progress in Treatment:  Attending groups: Yes  Participating in groups: Yes  Taking medication as prescribed: Yes  Tolerating medication: Yes  Family/Significant othe contact made: No  Patient understands diagnosis: Yes, AEB seeking treatment for substance abuse/detox.  Discussing patient identified problems/goals with staff: Yes  Medical problems stabilized or resolved: Yes  Denies suicidal/homicidal ideation: Yes  Patient has not harmed self or Others: Yes  New problem(s) identified: n/a  Discharge Plan or Barriers: Pt has BCBS. Will follow up with Florencia Reasons for therapy at [facility]. No psychiatrist currently available at this location. Will come to [facility] o/p for med management until psychiatrist available at [place] location.  Additional comments: none  Reason for Continuation of Hospitalization: d/c today Estimated length of stay: d/c today  For review of initial/current patient goals, please see plan of care.  Attendees:  Patient:    Family:    Physician: Aggie 06/29/2012 11:28 AM   Nursing:    Clinical Social Worker [employer], LCSWA  06/29/2012 11:28 AM   Other:    Other:    Other:     Other:    Scribe for Treatment Team:  Trula Slade LCSWA 06/29/2012 11:28 AM

## 2012-06-29 NOTE — B[facility] Suicide Risk Assessment (Signed)
[facility] INPATIENT:  Family/Significant Other Suicide Prevention Education  Suicide Prevention Education:  Contact Attempts: Benjamin Solomon (pt's wife)has been identified by the patient as the family member/significant other with whom the patient will be residing, and identified as the person(s) who will aid the patient in the event of a mental health crisis.  With written consent from the patient, two attempts were made to provide suicide prevention education, prior to and/or following the patient's discharge.  We were unsuccessful in providing suicide prevention education.  A suicide education pamphlet was given to the patient to share with family/significant other.  Date and time of first attempt 6/16 9:45AM Date and time of second attempt:6/16 11:23AM VM left.   Smart, Doniqua Saxby 06/29/2012, 11:22 AM

## 2012-06-29 NOTE — BHH Group Notes (Signed)
[facility] LCSW Aftercare Discharge Planning Group Note   06/29/2012 11:14 AM  Participation Quality:  Appropriate  Mood/Affect:  Appropriate  Depression Rating:  4  Anxiety Rating:  5  Thoughts of Suicide:  No Will you contract for safety?   NA  Current AVH:  No  Plan for Discharge/Comments:  Patient [facility] [place] and plans to attend AA.   Transportation Means: family  Supports: family  Benjamin Solomon, Benjamin Solomon

## 2012-06-29 NOTE — Progress Notes (Signed)
Patient ID: Benjamin Solomon, male   DOB: [birth year], 47 y.o.   MRN: [id number] He was discharged home and was picked up by a friend. He voiced understanding of discharge instruction and of follow up plan. He denies thoughts of SI and Hi, All belongings taken home with him. Fall risk  education was done.

## 2012-06-29 NOTE — B[facility] Suicide Risk Assessment (Signed)
Suicide Risk Assessment  Discharge Assessment     Demographic Factors:  Male, Low socioeconomic status and Unemployed  Mental Status Per Nursing Assessment::   On Admission:     Current Mental Status by Physician: patient denies suicidal ideation, intent or plan  Loss Factors: Financial problems/change in socioeconomic status  Historical Factors: Family history of mental illness or substance abuse and Impulsivity  Risk Reduction Factors:   Sense of responsibility to family, Living with another person, especially a relative and Positive social support  Continued Clinical Symptoms:  Alcohol/Substance Abuse/Dependencies  Cognitive Features That Contribute To Risk:  Closed-mindedness    Suicide Risk:  Minimal: No identifiable suicidal ideation.  Patients presenting with no risk factors but with morbid ruminations; may be classified as minimal risk based on the severity of the depressive symptoms  Discharge Diagnoses:   AXIS I: Alcohol dependence. Cocaine abuse. Depressive disorder, NOS  AXIS II:  Deferred AXIS III:   Past Medical History  Diagnosis Date  . Hypertension    AXIS IV:  economic problems, other psychosocial or environmental problems and problems related to social environment AXIS V:  61-70 mild symptoms  Plan Of Care/Follow-up recommendations:  Activity:  AS TOLERATED Diet:  healthy Tests:  routine Other:  patient to keep his after care appointment  Is patient on multiple antipsychotic therapies at discharge:  No   Has Patient had three or more failed trials of antipsychotic monotherapy by history:  No  Recommended Plan for Multiple Antipsychotic Therapies: N/A  Janila Arrazola,MD 06/29/2012, 11:45 AM

## 2012-06-29 NOTE — Progress Notes (Signed)
Adult Psychoeducational Group Note  Date:  06/29/2012 Time:  11:00AM  Group Topic/Focus:  Self Care:   The focus of this group is to help patients understand the importance of self-care in order to improve or restore emotional, physical, spiritual, interpersonal, and financial health.  Participation Level:  Active  Participation Quality:  Appropriate  Affect:  Appropriate  Cognitive:  Appropriate  Insight: Appropriate  Engagement in Group:  Engaged  Modes of Intervention:  Discussion  Additional Comments:  Pt participated in group discussion and was active throughout group   Eara Burruel K 06/29/2012, 1:00 PM

## 2012-06-30 NOTE — Discharge Summary (Signed)
Seen and agreed. Levoy Geisen, MD 

## 2012-06-30 NOTE — Progress Notes (Signed)
Patient Discharge Instructions:  Next Level Care Provider Has Access to the EMR, 06/30/12 Records provided to [facility] via [facility]/Epic access  Jerelene Redden, 06/30/2012, 11:32 AM

## 2012-07-01 ENCOUNTER — Ambulatory Visit (HOSPITAL_COMMUNITY): Payer: Self-pay | Admitting: Psychiatry

## 2012-07-07 ENCOUNTER — Encounter (HOSPITAL_COMMUNITY): Payer: Self-pay | Admitting: Psychiatry

## 2012-07-07 ENCOUNTER — Ambulatory Visit (INDEPENDENT_AMBULATORY_CARE_PROVIDER_SITE_OTHER): Payer: BC Managed Care – PPO | Admitting: Psychiatry

## 2012-07-07 DIAGNOSIS — F329 Major depressive disorder, single episode, unspecified: Secondary | ICD-10-CM

## 2012-07-07 DIAGNOSIS — F1420 Cocaine dependence, uncomplicated: Secondary | ICD-10-CM

## 2012-07-07 DIAGNOSIS — F1020 Alcohol dependence, uncomplicated: Secondary | ICD-10-CM

## 2012-07-09 NOTE — Patient Instructions (Signed)
Discussed orally 

## 2012-07-09 NOTE — Progress Notes (Signed)
Patient:   Benjamin Solomon   DOB:   1965-05-18  MR Number:  [id number]  Location:  [address], [place], [place] [address]  Date of Service:   Tuesday 07/07/2012  Start Time:   2:00 PM End Time:   2:55 PM  Provider/Observer:  Florencia Reasons, MSW, LCSW   Billing Code/Service:  [phone]  Chief Complaint:     Chief Complaint  Patient presents with  . Alcohol Problem  . Other    Drug Use - crack cocaine  . Depression  . Anxiety    Reason for Service:  The patient was referred for followup services upon his discharge from the [facility] in [place] where he was treated from 06/26/18/2014 through 06/27/2012 for alcohol dependence, cocaine dependence, and depression. Patient reports using alcohol since age 23, experimenting  with drugs for the past 10 years, and using cocaine for the past 3-4 months. Patient states alcohol and drug use "got out of hand" when he lost his job of 13 years  in April 2013 after being accused of falsifying his time which he denies. He reports hiring an attorney who won his case and  thinking he was pushed out of the company due to the preference for younger employees.While using unemployment benefits,  patient reports having a lot of free time and becoming involved with the wrong people. His unemployment check was terminated on 06/19/2012. He also reports getting a ticket for a  seatbelt violation around [facility]. He reports financial stress due to difficulty paying bills on time and this added expense. He worries about the future and reports losing about 13-20 pounds. He also is experiencing decreased motivation and difficulty falling and staying asleep stating he sleeps about 5 hours per night. He also reports tension in his marriage but reports wife is supportive.  Current Status:  The patient reports anxiety, excessive worrying, decreased appetite, difficulty falling and staying asleep, loss of interest in activities, sadness, and low  energy  Reliability of Information: Fairly reliable  Behavioral Observation: MAHLIK LENN  presents as a 47 y.o.-year-old Righthanded  African American Male who appeared older than  his stated age. His dress was appropriate and he was casual in appearance.  His manners were appropriate to the situation.  There were not any physical disabilities noted.  He displayed an appropriate level of cooperation and motivation.    Interactions:    Active   Attention:   normal  Memory:   normal  Visuo-spatial:   normal  Speech (Volume):  normal  Speech:   normal pitch and normal volume  Thought Process:  Coherent and Relevant  Though Content:  WNL  Orientation:   person, place, time/date, situation, day of week, month of year and year  Judgment:   Good  Planning:   Fair  Affect:    Appropriate  Mood:    Anxious and Depressed  Insight:   Fair  Intelligence:   normal  Marital Status/Living: The patient was born and reared in [place], [place]. He is the oldest of 5 siblings. His parents were never married. Patient was reared by his maternal grandmother while his siblings resided with his mother. She reports seeing them daily during childhood. Patient states having a comfortable childhood.  He was not involved with his father until he was in his 85s. He reports now having a good relationship with both parents. His grandmother died 15 years ago. He has been married 22 years and  has a 14 year old  son and a 67 year old daughter. Patient, his wife, and their daughter reside in [place]. His son is in the [employer] and currently is stationed in [place].  Current Employment: Unemployed  Past Employment:  Patient was employed at [employer] for 13 years. Prior to then, he was employed at [employer] for 13 years.  Substance Use:  There is a documented history of alcohol and cocaine abuse confirmed by the patient.  Patient has used since discharge from hospital on  06/29/2012. Patient reports using $40 worth of cocaine on 07/02/2012 and drinking a beer on 07/05/2012  Education:   HS Graduate  Medical History:   Past Medical History  Diagnosis Date  . Hypertension     Sexual History:   History  Sexual Activity  . Sexually Active: Not on file    Abuse/Trauma History: Denies  Psychiatric History:  The patient was hospitalized at the [facility] in June 2014 for treatment of alcohol and cocaine dependence along with depression.  Family Med/Psych History:  Family History  Problem Relation Age of Onset  . Hypertension Mother   . Hypertension Father   . Alcohol abuse Brother   . Drug abuse Brother     Risk of Suicide/Violence: Patient denies past and current suicidal and homicidal ideations. He reports no history of aggression or violence  Impression/DX:  Patient presents with a history of alcohol and cocaine abuse and dependence and reports one psychiatric hospitalization.  He also reports anxiety, excessive worrying, decreased appetite, difficulty falling and staying asleep, loss of interest in activities, sadness, and low energy. Diagnoses: Alcohol dependence, cocaine dependence, and depressive disorder NOS    Disposition/Plan:  Patient attends the assessment appointment today. Confidentiality and limits are discussed. Therapist discusses possibility of patient attending the chemical dependency intensive outpatient program in [place]. However, patient does not want to pursue at this time due to the travel distance. He agrees to attend AA and NA. He also agrees to return for an appointment in one week for continuing assessment and treatment planning. He is scheduled to see PA Hessie Diener watt in [place] for medication management. The patient agrees to call this practice, call 911, or have someone take him to the emergency room should symptoms worsen  Diagnosis:    Axis I:  Alcohol dependence  Cocaine dependence  Depressive  disorder, not elsewhere classified      Axis II: Deferred       Axis III:  See medical history      Axis IV:  economic problems and occupational problems          Axis V:  51-60 moderate symptoms

## 2012-07-14 ENCOUNTER — Ambulatory Visit (HOSPITAL_COMMUNITY): Payer: BC Managed Care – PPO | Admitting: Psychiatry

## 2012-07-22 ENCOUNTER — Ambulatory Visit (HOSPITAL_COMMUNITY): Payer: BC Managed Care – PPO | Admitting: Psychiatry

## 2012-08-04 ENCOUNTER — Ambulatory Visit (HOSPITAL_COMMUNITY): Payer: Self-pay | Admitting: Physician Assistant

## 2012-09-22 ENCOUNTER — Ambulatory Visit (INDEPENDENT_AMBULATORY_CARE_PROVIDER_SITE_OTHER): Payer: BC Managed Care – PPO | Admitting: Physician Assistant

## 2012-09-22 VITALS — BP 132/78 | HR 84 | Ht 68.0 in | Wt 154.4 lb

## 2012-09-22 DIAGNOSIS — F411 Generalized anxiety disorder: Secondary | ICD-10-CM

## 2012-09-22 DIAGNOSIS — F1020 Alcohol dependence, uncomplicated: Secondary | ICD-10-CM

## 2012-09-22 DIAGNOSIS — F1910 Other psychoactive substance abuse, uncomplicated: Secondary | ICD-10-CM

## 2012-09-22 DIAGNOSIS — F329 Major depressive disorder, single episode, unspecified: Secondary | ICD-10-CM

## 2012-09-22 DIAGNOSIS — F3289 Other specified depressive episodes: Secondary | ICD-10-CM

## 2012-09-22 DIAGNOSIS — F1010 Alcohol abuse, uncomplicated: Secondary | ICD-10-CM

## 2012-09-22 DIAGNOSIS — F1420 Cocaine dependence, uncomplicated: Secondary | ICD-10-CM

## 2012-09-22 MED ORDER — FLUOXETINE HCL 20 MG PO TABS: 20.0000 mg | ORAL_TABLET | Freq: Every day | ORAL | Status: DC

## 2012-09-22 MED ORDER — MIRTAZAPINE 15 MG PO TABS: 15.0000 mg | ORAL_TABLET | Freq: Every day | ORAL | Status: DC

## 2012-10-04 ENCOUNTER — Encounter (HOSPITAL_COMMUNITY): Payer: Self-pay | Admitting: Physician Assistant

## 2012-10-04 DIAGNOSIS — F411 Generalized anxiety disorder: Secondary | ICD-10-CM | POA: Insufficient documentation

## 2012-10-04 NOTE — Progress Notes (Signed)
Psychiatric Assessment Adult  Patient Identification:  Benjamin Solomon Date of Evaluation:  09/22/2012 Chief Complaint: need to establish outpatient care after being discharged from inpatient treatment for alcohol and drug use History of Chief Complaint:   Chief Complaint  Patient presents with  . Anxiety  . Alcohol Problem  . Establish Care    HPI Benjamin Solomon is a 47 year old married, unemployed, African American male who was discharged from the adult inpatient unit at [facility] in June of 2014 after an admission for treatment of alcohol and cocaine use. He was initially scheduled to followup with this provider and July, and he was also scheduled to followup with Benjamin Reasons, LCSW for one-to-one therapy. He met with Benjamin Solomon once and June, and agreed to return after 1 week, but has not returned. Benjamin Solomon admits that he has drank twice since his discharge, and used cocaine once. He minimizes his substance use although he has a history of 3 DWI convictions. He also reports that his mood is "okay." He complains that the Zoloft that he was discharged from the inpatient unit on, causes him to feel slightly sedated. He denies that he is having any symptoms of depression, and states that he sleeps well with the trazodone as prescribed. He denies is having anxiety other than some excessive worry. He reports he is having marital problems because a former girlfriend has pressed charges against him for stealing from her. 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 He denies any current suicidal or homicidal ideation. He denies any auditory or visual hallucinations.  Review of Systems  Constitutional: Negative.   HENT: Negative.   Eyes: Negative.   Respiratory: Negative.   Cardiovascular: Negative.   Gastrointestinal: Negative.   Endocrine: Negative.   Genitourinary: Negative.   Musculoskeletal: Negative.   Skin: Negative.    Allergic/Immunologic: Negative.   Neurological: Negative.   Hematological: Negative.   Psychiatric/Behavioral: Positive for dysphoric mood. The patient is nervous/anxious.    Physical Exam  Constitutional: He is oriented to person, place, and time. He appears well-developed and well-nourished.  HENT:  Head: Normocephalic and atraumatic.  Eyes: Pupils are equal, round, and reactive to light.  Neck: Normal range of motion.  Musculoskeletal: Normal range of motion.  Neurological: He is alert and oriented to person, place, and time.    Depressive Symptoms: depressed mood, anhedonia, feelings of worthlessness/guilt, hopelessness, anxiety,  (Hypo) Manic Symptoms:   Elevated Mood:  No Irritable Mood:  No Grandiosity:  No Distractibility:  No Labiality of Mood:  No Delusions:  No Hallucinations:  No Impulsivity:  No Sexually Inappropriate Behavior:  No Financial Extravagance:  No Flight of Ideas:  No  Anxiety Symptoms: Excessive Worry:  Yes Panic Symptoms:  No Agoraphobia:  No Obsessive Compulsive: No  Symptoms: None, Specific Phobias:  No Social Anxiety:  No  Psychotic Symptoms:  Hallucinations: No None Delusions:  No Paranoia:  No   Ideas of Reference:  No  PTSD Symptoms: Ever had a traumatic exposure:  No Had a traumatic exposure in the last month:  No Re-experiencing: No None Hypervigilance:  No Hyperarousal: No None Avoidance: No None  Traumatic Brain Injury: No   Past Psychiatric History: Diagnosis: depressive disorder NOS, alcohol dependence, cocaine dependence  Hospitalizations: once at [facility]  Outpatient Care:  Benjamin Solomon  Substance Abuse Care: [facility] For detox  Self-Mutilation: denies  Suicidal Attempts: denies  Violent Behaviors: denies  Past Medical History:   Past Medical History  Diagnosis Date  . Hypertension    History of Loss of Consciousness:  No Seizure History:  No Cardiac History:  No Allergies:  No Known  Allergies Current Medications:  Current Outpatient Prescriptions  Medication Sig Dispense Refill  . FLUoxetine (PROZAC) 20 MG tablet Take 1 tablet (20 mg total) by mouth daily.  30 tablet  0  . lisinopril-hydrochlorothiazide (PRINZIDE,ZESTORETIC) 10-12.5 MG per tablet Take 1 tablet by mouth daily. For high blood pressure control      . mirtazapine (REMERON) 15 MG tablet Take 1 tablet (15 mg total) by mouth at bedtime.  30 tablet  0  . traZODone (DESYREL) 50 MG tablet Take 1 tablet (50 mg total) by mouth at bedtime as needed and may repeat dose one time if needed for sleep. For sleep  60 tablet  0   No current facility-administered medications for this visit.    Previous Psychotropic Medications:  Medication Dose   Zoloft  25 mg daily  trazodone 50 mg each bedtime                  Substance Abuse History in the last 12 months: The patient admits to a history of alcohol and cocaine abuse, and describes a pattern of continued abuse.  Medical Consequences of Substance Abuse: none known  Legal Consequences of Substance Abuse: DWI x3  Family Consequences of Substance Abuse: difficulties with his marriage  Blackouts:  Yes DT's:  No Withdrawal Symptoms:  Yes Tremors  Social History: Benjamin Solomon was born and grew up in [place], [place]. He has 2 brothers and 4 sisters. He is the oldest. He reports his childhood was "pretty good." He was raised by his maternal grandmother. He achieved a GED. He has been married for 22 years. He has one son and one daughter. He lives with his wife. He reports he has a court date pending for a larceny charge. 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 His hobbies include bowling. His social support system consists of his wife and his father.   Family History:   Family History  Problem Relation Age of Onset  . Hypertension Mother   . Hypertension Father   . Alcohol abuse Brother   . Drug abuse Brother     Mental Status Examination/Evaluation: Objective:   Appearance: Casual  Eye Contact::  Good  Speech:  Clear and Coherent  Volume:  Normal  Mood:  Mildly anxious  Affect:  Appropriate  Thought Process:  Linear  Orientation:  Full (Time, Place, and Person)  Thought Content:  WDL  Suicidal Thoughts:  No  Homicidal Thoughts:  No  Judgement:  Impaired  Insight:  Lacking  Psychomotor Activity:  Normal  Akathisia:  No  Handed:    AIMS (if indicated):    Assets:  Communication Skills    Laboratory/X-Ray Psychological Evaluation(s)        Assessment:    AXIS I Alcohol Abuse, Depressive Disorder NOS, Generalized Anxiety Disorder and Substance Abuse  AXIS II Deferred  AXIS III Past Medical History  Diagnosis Date  . Hypertension      AXIS IV economic problems, educational problems, occupational problems, problems related to legal system/crime, problems related to social environment and problems with primary support group  AXIS V 41-50 serious symptoms   Treatment Plan/Recommendations:  Plan of Care: we'll change his Zoloft to Prozac, and his trazodone to Remeron to improve his daytime sedation. Highly recommend AA meetings on a daily basis  Laboratory:    Psychotherapy: encouraged to continue with Benjamin Solomon  Medications: Prozac 20 mg daily, Remeron 15 mg at bedtime  Routine PRN Medications:  No  Consultations: none at this time  Safety Concerns:  Risk for relapse on alcohol and cocaine  Other:      Marriana Hibberd, PA-C 9/21/201412:39 PM

## 2012-10-15 ENCOUNTER — Ambulatory Visit (HOSPITAL_COMMUNITY): Payer: Self-pay | Admitting: Physician Assistant

## 2012-11-07 ENCOUNTER — Other Ambulatory Visit (HOSPITAL_COMMUNITY): Payer: Self-pay | Admitting: Physician Assistant

## 2012-11-19 ENCOUNTER — Other Ambulatory Visit: Payer: Self-pay

## 2012-11-24 ENCOUNTER — Ambulatory Visit (HOSPITAL_COMMUNITY): Payer: Self-pay | Admitting: Psychiatry

## 2012-12-03 ENCOUNTER — Ambulatory Visit (INDEPENDENT_AMBULATORY_CARE_PROVIDER_SITE_OTHER): Payer: BC Managed Care – PPO | Admitting: Psychiatry

## 2012-12-03 ENCOUNTER — Encounter (HOSPITAL_COMMUNITY): Payer: Self-pay | Admitting: Psychiatry

## 2012-12-03 VITALS — BP 160/100 | Ht 68.0 in | Wt 164.0 lb

## 2012-12-03 DIAGNOSIS — F329 Major depressive disorder, single episode, unspecified: Secondary | ICD-10-CM

## 2012-12-03 DIAGNOSIS — F1010 Alcohol abuse, uncomplicated: Secondary | ICD-10-CM

## 2012-12-03 DIAGNOSIS — F411 Generalized anxiety disorder: Secondary | ICD-10-CM

## 2012-12-03 DIAGNOSIS — F1910 Other psychoactive substance abuse, uncomplicated: Secondary | ICD-10-CM

## 2012-12-03 MED ORDER — MIRTAZAPINE 15 MG PO TABS: 15.0000 mg | ORAL_TABLET | Freq: Every day | ORAL | Status: DC

## 2012-12-03 MED ORDER — FLUOXETINE HCL 20 MG PO CAPS: 20.0000 mg | ORAL_CAPSULE | Freq: Every day | ORAL | Status: DC

## 2012-12-03 NOTE — Progress Notes (Signed)
Patient ID: Benjamin Solomon, male   DOB: 1965-03-20, 47 y.o.   MRN: [id number]  Psychiatric Assessment Adult  Patient Identification:  Benjamin Solomon Date of Evaluation:  09/22/2012 Chief Complaint: need to establish outpatient care after being discharged from inpatient treatment for alcohol and drug use History of Chief Complaint:   Chief Complaint  Patient presents with  . Alcohol Problem  . Drug Problem  . Depression  . Follow-up    Alcohol Problem  Drug Problem   Benjamin Solomon is a 47 year old married, employed, African American male who was discharged from the adult inpatient unit at [facility] in June of 2014 after an admission for treatment of alcohol and cocaine use. He was initially scheduled to followup with this provider and July, and he was also scheduled to followup with Florencia Reasons, LCSW for one-to-one therapy. He met with Gigi Gin once and June, and agreed to return after 1 week, but has not returned. Marshaun admits that he has drank twice since his discharge, and used cocaine once. He minimizes his substance use although he has a history of 3 DWI convictions. He also reports that his mood is "okay." He complains that the Zoloft that he was discharged from the inpatient unit on, causes him to feel slightly sedated. He denies that he is having any symptoms of depression, and states that he sleeps well with the trazodone as prescribed. He denies is having anxiety other than some excessive worry. He reports he is having marital problems because a former girlfriend has pressed charges against him for stealing from her. He is currently unemployed, but has a job offer from [employer] transferring to a sober the road. He denies any current suicidal or homicidal ideation. He denies any auditory or visual hallucinations.  The patient returns after 2 months. His previousl provider no longer works for [employer] and he would rather be seen here in [name] where he lives. 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 They do drug screening so is no longer using cocaine and only "sneaks a beer" about once a week. He does feel like Prozac has helped his mood and he is no longer drowsy. He sleeps well with the Remeron. He is slightly irritable but doesn't have any significant depression. He is working with a Engineer, civil (consulting) from [employer] and is planning to start going to [employer] but she suggested. He's not at all suicidal and has no psychotic symptoms. He's trying to get work closer to home so he can spend more time with his wife.  Review of Systems  Constitutional: Negative.   HENT: Negative.   Eyes: Negative.   Respiratory: Negative.   Cardiovascular: Negative.   Gastrointestinal: Negative.   Endocrine: Negative.   Genitourinary: Negative.   Musculoskeletal: Negative.   Skin: Negative.   Allergic/Immunologic: Negative.   Neurological: Negative.   Hematological: Negative.   Psychiatric/Behavioral: Positive for dysphoric mood. The patient is nervous/anxious.    Physical Exam  Constitutional: He is oriented to person, place, and time. He appears well-developed and well-nourished.  HENT:  Head: Normocephalic and atraumatic.  Eyes: Pupils are equal, round, and reactive to light.  Neck: Normal range of motion.  Musculoskeletal: Normal range of motion.  Neurological: He is alert and oriented to person, place, and time.    Depressive Symptoms: depressed mood, anhedonia, feelings of worthlessness/guilt, hopelessness, anxiety,  (Hypo) Manic Symptoms:   Elevated Mood:  No Irritable Mood:  No Grandiosity:  No Distractibility:  No Labiality of Mood:  No Delusions:  No Hallucinations:  No Impulsivity:  No Sexually Inappropriate Behavior:  No Financial Extravagance:  No Flight of Ideas:  No  Anxiety Symptoms: Excessive Worry:  Yes Panic Symptoms:  No Agoraphobia:  No Obsessive Compulsive: No  Symptoms:  None, Specific Phobias:  No Social Anxiety:  No  Psychotic Symptoms:  Hallucinations: No None Delusions:  No Paranoia:  No   Ideas of Reference:  No  PTSD Symptoms: Ever had a traumatic exposure:  No Had a traumatic exposure in the last month:  No Re-experiencing: No None Hypervigilance:  No Hyperarousal: No None Avoidance: No None  Traumatic Brain Injury: No   Past Psychiatric History: Diagnosis: depressive disorder NOS, alcohol dependence, cocaine dependence  Hospitalizations: once at [facility]  Outpatient Care:  [facility]  Substance Abuse Care: [facility] For detox  Self-Mutilation: denies  Suicidal Attempts: denies  Violent Behaviors: denies   Past Medical History:   Past Medical History  Diagnosis Date  . Hypertension    History of Loss of Consciousness:  No Seizure History:  No Cardiac History:  No Allergies:  No Known Allergies Current Medications:  Current Outpatient Prescriptions  Medication Sig Dispense Refill  . FLUoxetine (PROZAC) 20 MG capsule Take 1 capsule (20 mg total) by mouth daily.  90 capsule  2  . lisinopril-hydrochlorothiazide (PRINZIDE,ZESTORETIC) 10-12.5 MG per tablet Take 1 tablet by mouth daily. For high blood pressure control      . mirtazapine (REMERON) 15 MG tablet Take 1 tablet (15 mg total) by mouth at bedtime.  90 tablet  2   No current facility-administered medications for this visit.    Previous Psychotropic Medications:  Medication Dose   Zoloft  25 mg daily  trazodone 50 mg each bedtime                  Substance Abuse History in the last 12 months: The patient admits to a history of alcohol and cocaine abuse, and describes a pattern of continued abuse.  Medical Consequences of Substance Abuse: none known  Legal Consequences of Substance Abuse: DWI x3  Family Consequences of Substance Abuse: difficulties with his marriage  Blackouts:  Yes DT's:  No Withdrawal Symptoms:  Yes Tremors  Social  History: Benjamin Solomon was born and grew up in [place], [place]. He has 2 brothers and 4 sisters. He is the oldest. He reports his childhood was "pretty good." He was raised by his maternal grandmother. He achieved a GED. He has been married for 22 years. He has one son and one daughter. He lives with his wife. He reports he has a court date pending for a larceny charge. 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 His hobbies include bowling. His social support system consists of his wife and his father.   Family History:   Family History  Problem Relation Age of Onset  . Hypertension Mother   . Hypertension Father   . Alcohol abuse Brother   . Drug abuse Brother     Mental Status Examination/Evaluation: Objective:  Appearance: Casual  Eye Contact::  Good  Speech:  Clear and Coherent  Volume:  Normal  Mood:  Mildly anxious  Affect:  Appropriate  Thought Process:  Linear  Orientation:  Full (Time, Place, and Person)  Thought Content:  WDL  Suicidal Thoughts:  No  Homicidal Thoughts:  No  Judgement:  Impaired  Insight:  Lacking  Psychomotor Activity:  Normal  Akathisia:  No  Handed:    AIMS (if indicated):    Assets:  Communication Skills    Laboratory/X-Ray Psychological Evaluation(s)        Assessment:    AXIS I Alcohol Abuse, Depressive Disorder NOS, Generalized Anxiety Disorder and Substance Abuse  AXIS II Deferred  AXIS III Past Medical History  Diagnosis Date  . Hypertension      AXIS IV economic problems, educational problems, occupational problems, problems related to legal system/crime, problems related to social environment and problems with primary support group  AXIS V 41-50 serious symptoms   Treatment Plan/Recommendations:  Plan of Care: He'll continue Prozac and Remeron  Highly recommend AA meetings on a daily basis  Laboratory:    Psychotherapy: encouraged to continue with Florencia Reasons  Medications: Prozac 20 mg daily, Remeron 15 mg at bedtime  Routine PRN  Medications:  No  Consultations: none at this time  Safety Concerns:  Risk for relapse on alcohol and cocaine so far he is doing fairly well   Other:      Diannia Ruder, MD 11/20/20143:12 PM

## 2012-12-04 ENCOUNTER — Ambulatory Visit (HOSPITAL_COMMUNITY): Payer: Self-pay | Admitting: Psychiatry

## 2012-12-23 ENCOUNTER — Ambulatory Visit (HOSPITAL_COMMUNITY): Payer: Self-pay | Admitting: Psychiatry

## 2013-01-18 ENCOUNTER — Ambulatory Visit (HOSPITAL_COMMUNITY): Payer: Self-pay | Admitting: Psychiatry

## 2013-02-27 ENCOUNTER — Encounter (HOSPITAL_COMMUNITY): Payer: Self-pay | Admitting: Emergency Medicine

## 2013-02-27 ENCOUNTER — Emergency Department (HOSPITAL_COMMUNITY)
Admission: EM | Admit: 2013-02-27 | Discharge: 2013-02-27 | Disposition: A | Discharge status: Home / Self Care | Payer: BC Managed Care – PPO | Attending: Emergency Medicine | Admitting: Emergency Medicine

## 2013-02-27 DIAGNOSIS — F17200 Nicotine dependence, unspecified, uncomplicated: Secondary | ICD-10-CM | POA: Insufficient documentation

## 2013-02-27 DIAGNOSIS — Z79899 Other long term (current) drug therapy: Secondary | ICD-10-CM | POA: Insufficient documentation

## 2013-02-27 DIAGNOSIS — R51 Headache: Secondary | ICD-10-CM | POA: Insufficient documentation

## 2013-02-27 DIAGNOSIS — I10 Essential (primary) hypertension: Secondary | ICD-10-CM | POA: Insufficient documentation

## 2013-02-27 LAB — BASIC METABOLIC PANEL
BUN: 11 mg/dL (ref 6–23)
CHLORIDE: 101 meq/L (ref 96–112)
CO2: 27 meq/L (ref 19–32)
CREATININE: 1.02 mg/dL (ref 0.50–1.35)
Calcium: 9.2 mg/dL (ref 8.4–10.5)
GFR calc Af Amer: >90 mL/min (ref >90)
GFR calc non Af Amer: 85 mL/min — ABNORMAL LOW (ref >90)
GLUCOSE: 91 mg/dL (ref 70–99)
Potassium: 3.9 mEq/L (ref 3.7–5.3)
SODIUM: 138 meq/L (ref 137–147)

## 2013-02-27 LAB — CBC WITH DIFFERENTIAL/PLATELET
BASOS PCT: 0 % (ref 0–1)
Basophils Absolute: 0 10*3/uL (ref 0.0–0.1)
EOS PCT: 3 % (ref 0–5)
Eosinophils Absolute: 0.3 10*3/uL (ref 0.0–0.7)
HCT: 39.4 % (ref 39.0–52.0)
HEMOGLOBIN: 13.6 g/dL (ref 13.0–17.0)
LYMPHS ABS: 3.4 10*3/uL (ref 0.7–4.0)
Lymphocytes Relative: 39 % (ref 12–46)
MCH: 31.3 pg (ref 26.0–34.0)
MCHC: 34.5 g/dL (ref 30.0–36.0)
MCV: 90.6 fL (ref 78.0–100.0)
MONOS PCT: 7 % (ref 3–12)
Monocytes Absolute: 0.6 10*3/uL (ref 0.1–1.0)
Neutro Abs: 4.4 10*3/uL (ref 1.7–7.7)
Neutrophils Relative %: 51 % (ref 43–77)
PLATELETS: 223 10*3/uL (ref 150–400)
RBC: 4.35 MIL/uL (ref 4.22–5.81)
RDW: 15.2 % (ref 11.5–15.5)
WBC: 8.7 10*3/uL (ref 4.0–10.5)

## 2013-02-27 LAB — TROPONIN I

## 2013-02-27 MED ORDER — IBUPROFEN 800 MG PO TABS: 800.0000 mg | ORAL_TABLET | Freq: Three times a day (TID) | ORAL | Status: DC

## 2013-02-27 MED ORDER — LISINOPRIL-HYDROCHLOROTHIAZIDE 10-12.5 MG PO TABS: 1.0000 | ORAL_TABLET | Freq: Every day | ORAL | Status: DC

## 2013-02-27 MED ORDER — IBUPROFEN 800 MG PO TABS
800.0000 mg | ORAL_TABLET | Freq: Once | ORAL | Status: AC
  Administered 2013-02-27: 800 mg via ORAL
  Filled 2013-02-27: qty 1

## 2013-02-27 MED ORDER — CLONIDINE HCL 0.2 MG PO TABS
0.2000 mg | ORAL_TABLET | Freq: Once | ORAL | Status: AC
  Administered 2013-02-27: 0.2 mg via ORAL
  Filled 2013-02-27: qty 1

## 2013-02-27 NOTE — ED Notes (Signed)
Pt ambulatory to restroom, tolerated well,  

## 2013-02-27 NOTE — Discharge Instructions (Signed)
Restart  blood pressure medication this afternoon.   Tylenol or ibuprofen for pain. Followup your primary care Dr.

## 2013-02-27 NOTE — ED Provider Notes (Signed)
CSN: [id number]     Arrival date & time 02/27/13  1154 History  This chart was scribed for Nat Christen, MD by Zettie Pho, ED Scribe. This patient was seen in room APA02/APA02 and the patient's care was started at 12:24 PM.    Chief Complaint  Patient presents with  . Hypertension   The history is provided by the patient. No language interpreter was used.   HPI Comments: Benjamin Solomon is a 48 y.o. Male with a history of HTN who presents to the Emergency Department complaining of elevated blood pressure (179/119 measured at home this morning, 163/113 measured upon arrival to the ED). Patient reports that his blood pressure is usually well-controlled with Lisinopril 10-12.5 mg, but that he ran out about 2 weeks ago and has not been able to have it refilled. Patient is complaining of a diffuse, dull headache. He denies syncope. Patient has no other pertinent medical history.   Patient is also requesting a refill of hydrocodone that he was prescribed for an abscess to his testicle. Patient states that another abscess has since appeared on his thigh.   PCP- Urgent Care in [place]   Past Medical History  Diagnosis Date  . Hypertension    Past Surgical History  Procedure Laterality Date  . Cervical spine surgery  2009  . Colonoscopy    . Evaluation under anesthesia with hemorrhoidectomy  12/20/2011    Procedure: EXAM UNDER ANESTHESIA WITH HEMORRHOIDECTOMY;  Surgeon: Harl Bowie, MD;  Location: [facility];  Service: General;  Laterality: N/A;  exam under anesthesia with hemorrhoidectomy   Family History  Problem Relation Age of Onset  . Hypertension Mother   . Hypertension Father   . Alcohol abuse Brother   . Drug abuse Brother    History  Substance Use Topics  . Smoking status: Current Every Day Smoker -- 1.00 packs/day    Types: Cigarettes  . Smokeless tobacco: Never Used  . Alcohol Use: Yes     Comment: occasional beer, liquor and wine    Review of  Systems  A complete 10 system review of systems was obtained and all systems are negative except as noted in the HPI and PMH.    Allergies  Review of patient's allergies indicates no known allergies.  Home Medications   Current Outpatient Rx  Name  Route  Sig  Dispense  Refill  . FLUoxetine (PROZAC) 20 MG capsule   Oral   Take 1 capsule (20 mg total) by mouth daily.   90 capsule   2   . lisinopril-hydrochlorothiazide (PRINZIDE,ZESTORETIC) 10-12.5 MG per tablet   Oral   Take 1 tablet by mouth daily. For high blood pressure control         . mirtazapine (REMERON) 15 MG tablet   Oral   Take 1 tablet (15 mg total) by mouth at bedtime.   90 tablet   2    Triage Vitals: BP 153/102  Pulse 85  Temp(Src) 98 F (36.7 C) (Oral)  Resp 20  Ht 5\' 8"  (1.727 m)  Wt 160 lb (72.576 kg)  BMI 24.33 kg/m2  SpO2 98%  Physical Exam  Nursing note and vitals reviewed. Constitutional: He is oriented to person, place, and time. He appears well-developed and well-nourished.  HENT:  Head: Normocephalic and atraumatic.  Eyes: Conjunctivae and EOM are normal. Pupils are equal, round, and reactive to light.  Neck: Normal range of motion. Neck supple.  Cardiovascular: Normal rate, regular rhythm and normal heart  sounds.   Pulmonary/Chest: Effort normal and breath sounds normal.  Abdominal: Soft. Bowel sounds are normal.  Musculoskeletal: Normal range of motion.  Neurological: He is alert and oriented to person, place, and time.  Skin: Skin is warm and dry.  Psychiatric: He has a normal mood and affect. His behavior is normal.    ED Course  Procedures (including critical care time)  DIAGNOSTIC STUDIES: Oxygen Saturation is 98% on room air, normal by my interpretation.    COORDINATION OF CARE: 12:27 PM- Will order blood labs. Will order Catapres to manage blood pressure. Will discharge patient with a 3 month refill of his Lisinopril. Advised patient to soak the abscessed areas in warm  water. Discussed treatment plan with patient at bedside and patient verbalized agreement.   Results for orders placed during the hospital encounter of 02/27/13  CBC WITH DIFFERENTIAL      Result Value Ref Range   WBC 8.7  4.0 - 10.5 K/uL   RBC 4.35  4.22 - 5.81 MIL/uL   Hemoglobin 13.6  13.0 - 17.0 g/dL   HCT 39.4  39.0 - 52.0 %   MCV 90.6  78.0 - 100.0 fL   MCH 31.3  26.0 - 34.0 pg   MCHC 34.5  30.0 - 36.0 g/dL   RDW 15.2  11.5 - 15.5 %   Platelets 223  150 - 400 K/uL   Neutrophils Relative % PENDING  43 - 77 %   Neutro Abs PENDING  1.7 - 7.7 K/uL   Band Neutrophils PENDING  0 - 10 %   Lymphocytes Relative PENDING  12 - 46 %   Lymphs Abs PENDING  0.7 - 4.0 K/uL   Monocytes Relative PENDING  3 - 12 %   Monocytes Absolute PENDING  0.1 - 1.0 K/uL   Eosinophils Relative PENDING  0 - 5 %   Eosinophils Absolute PENDING  0.0 - 0.7 K/uL   Basophils Relative PENDING  0 - 1 %   Basophils Absolute PENDING  0.0 - 0.1 K/uL   WBC Morphology PENDING     RBC Morphology PENDING     Smear Review PENDING     nRBC PENDING  0 /100 WBC   Metamyelocytes Relative PENDING     Myelocytes PENDING     Promyelocytes Absolute PENDING     Blasts PENDING    BASIC METABOLIC PANEL      Result Value Ref Range   Sodium 138  137 - 147 mEq/L   Potassium 3.9  3.7 - 5.3 mEq/L   Chloride 101  96 - 112 mEq/L   CO2 27  19 - 32 mEq/L   Glucose, Bld 91  70 - 99 mg/dL   BUN 11  6 - 23 mg/dL   Creatinine, Ser 1.02  0.50 - 1.35 mg/dL   Calcium 9.2  8.4 - 10.5 mg/dL   GFR calc non Af Amer 85 (*) >90 mL/min   GFR calc Af Amer >90  >90 mL/min  TROPONIN I      Result Value Ref Range   Troponin I <0.30  <0.30 ng/mL    EKG Interpretation    Date/Time:  Saturday February 27 2013 11:57:07 EST Ventricular Rate:  85 PR Interval:  154 QRS Duration: 86 QT Interval:  390 QTC Calculation: 464 R Axis:   61 Text Interpretation:  Normal sinus rhythm Possible Left atrial enlargement Left ventricular hypertrophy Abnormal  ECG When compared with ECG of 14-Mar-2011 16:50, No significant change was found Confirmed  by Lacinda Axon  MD, [facility] [phone]) on 02/27/2013 1:04:20 PM            MDM   Final diagnoses:  None    No neurological deficits. Will refill blood pressure medication. Patient has primary care followup.  I personally performed the services described in this documentation, which was scribed in my presence. The recorded information has been reviewed and is accurate.     Nat Christen, MD 02/27/13 1344

## 2013-02-27 NOTE — ED Notes (Signed)
Dr Cook at bedside,  

## 2013-02-27 NOTE — ED Notes (Signed)
Pt requesting something for pain to groin area. States "I have two boils to my groin" Dr. Lacinda Axon notified, additional orders given

## 2013-02-27 NOTE — ED Notes (Addendum)
Pt states he ran out of blood pressure medication 2 weeks ago. Attempted to have it called in but it was not refilled. Pt states he had chest discomfort last night which caused him to check his blood pressure. Pt denies discomfort to chest chest today, although states pain to left arm today. Also states boils to left upper thigh and left testicle which are causing a lot of pain.

## 2013-02-27 NOTE — ED Notes (Addendum)
Pt c/o left side chest pain described as "sharp" that occurred last night, pain did radiate to left arm area, has been having problems with elevated blood pressure, states that he ran out of one of his blood pressure medications and an additional medication was added a month ago, pt also complains of dull headache,

## 2013-05-26 ENCOUNTER — Emergency Department (HOSPITAL_COMMUNITY)
Admission: EM | Admit: 2013-05-26 | Discharge: 2013-05-26 | Disposition: A | Discharge status: Home / Self Care | Payer: BC Managed Care – PPO | Attending: Emergency Medicine | Admitting: Emergency Medicine

## 2013-05-26 ENCOUNTER — Emergency Department (HOSPITAL_COMMUNITY): Payer: BC Managed Care – PPO

## 2013-05-26 ENCOUNTER — Encounter (HOSPITAL_COMMUNITY): Payer: Self-pay | Admitting: Emergency Medicine

## 2013-05-26 DIAGNOSIS — R599 Enlarged lymph nodes, unspecified: Secondary | ICD-10-CM | POA: Insufficient documentation

## 2013-05-26 DIAGNOSIS — I10 Essential (primary) hypertension: Secondary | ICD-10-CM | POA: Insufficient documentation

## 2013-05-26 DIAGNOSIS — Z9889 Other specified postprocedural states: Secondary | ICD-10-CM | POA: Insufficient documentation

## 2013-05-26 DIAGNOSIS — Z792 Long term (current) use of antibiotics: Secondary | ICD-10-CM | POA: Insufficient documentation

## 2013-05-26 DIAGNOSIS — N509 Disorder of male genital organs, unspecified: Secondary | ICD-10-CM | POA: Insufficient documentation

## 2013-05-26 DIAGNOSIS — R590 Localized enlarged lymph nodes: Secondary | ICD-10-CM

## 2013-05-26 DIAGNOSIS — Z79899 Other long term (current) drug therapy: Secondary | ICD-10-CM | POA: Insufficient documentation

## 2013-05-26 DIAGNOSIS — F17200 Nicotine dependence, unspecified, uncomplicated: Secondary | ICD-10-CM | POA: Insufficient documentation

## 2013-05-26 LAB — COMPREHENSIVE METABOLIC PANEL
ALBUMIN: 3.8 g/dL (ref 3.5–5.2)
ALK PHOS: 75 U/L (ref 39–117)
ALT: 13 U/L (ref 0–53)
AST: 21 U/L (ref 0–37)
BUN: 9 mg/dL (ref 6–23)
CALCIUM: 9.4 mg/dL (ref 8.4–10.5)
CO2: 28 mEq/L (ref 19–32)
Chloride: 99 mEq/L (ref 96–112)
Creatinine, Ser: 0.89 mg/dL (ref 0.50–1.35)
GFR calc Af Amer: >90 mL/min (ref >90)
GFR calc non Af Amer: >90 mL/min (ref >90)
Glucose, Bld: 102 mg/dL — ABNORMAL HIGH (ref 70–99)
POTASSIUM: 3.6 meq/L — AB (ref 3.7–5.3)
Sodium: 139 mEq/L (ref 137–147)
TOTAL PROTEIN: 7.5 g/dL (ref 6.0–8.3)
Total Bilirubin: 0.5 mg/dL (ref 0.3–1.2)

## 2013-05-26 LAB — CBC WITH DIFFERENTIAL/PLATELET
Basophils Absolute: 0.1 10*3/uL (ref 0.0–0.1)
Basophils Relative: 1 % (ref 0–1)
Eosinophils Absolute: 0.3 10*3/uL (ref 0.0–0.7)
Eosinophils Relative: 3 % (ref 0–5)
HEMATOCRIT: 38.9 % — AB (ref 39.0–52.0)
HEMOGLOBIN: 13.5 g/dL (ref 13.0–17.0)
LYMPHS PCT: 42 % (ref 12–46)
Lymphs Abs: 4.3 10*3/uL — ABNORMAL HIGH (ref 0.7–4.0)
MCH: 30.8 pg (ref 26.0–34.0)
MCHC: 34.7 g/dL (ref 30.0–36.0)
MCV: 88.8 fL (ref 78.0–100.0)
MONO ABS: 0.9 10*3/uL (ref 0.1–1.0)
MONOS PCT: 9 % (ref 3–12)
NEUTROS ABS: 4.6 10*3/uL (ref 1.7–7.7)
Neutrophils Relative %: 45 % (ref 43–77)
Platelets: 206 10*3/uL (ref 150–400)
RBC: 4.38 MIL/uL (ref 4.22–5.81)
RDW: 14.6 % (ref 11.5–15.5)
WBC: 10.1 10*3/uL (ref 4.0–10.5)

## 2013-05-26 LAB — URINE MICROSCOPIC-ADD ON

## 2013-05-26 LAB — URINALYSIS, ROUTINE W REFLEX MICROSCOPIC
BILIRUBIN URINE: NEGATIVE
GLUCOSE, UA: NEGATIVE mg/dL
KETONES UR: NEGATIVE mg/dL
LEUKOCYTES UA: NEGATIVE
NITRITE: NEGATIVE
PH: 5.5 (ref 5.0–8.0)
Protein, ur: NEGATIVE mg/dL
SPECIFIC GRAVITY, URINE: 1.015 (ref 1.005–1.030)
Urobilinogen, UA: 0.2 mg/dL (ref 0.0–1.0)

## 2013-05-26 MED ORDER — OXYCODONE-ACETAMINOPHEN 5-325 MG PO TABS
2.0000 | ORAL_TABLET | Freq: Once | ORAL | Status: AC
  Administered 2013-05-26: 2 via ORAL
  Filled 2013-05-26: qty 2

## 2013-05-26 MED ORDER — IOHEXOL 300 MG/ML  SOLN
100.0000 mL | Freq: Once | INTRAMUSCULAR | Status: AC | PRN
  Administered 2013-05-26: 100 mL via INTRAVENOUS

## 2013-05-26 MED ORDER — IOHEXOL 300 MG/ML  SOLN
50.0000 mL | Freq: Once | INTRAMUSCULAR | Status: AC | PRN
  Administered 2013-05-26: 50 mL via ORAL

## 2013-05-26 NOTE — ED Provider Notes (Signed)
Pt left at change of shift to get [place] results. We discussed patient's CT and ultrasound results. Patient requests referral to urology. He was given [facility] number. He is concerned that he still has a swollen lymph nodes. Patient was reassured that they can take a long time to go away.   US Scrotum  05/26/2013   CLINICAL DATA:  Abscess.  EXAM: SCROTAL ULTRASOUND  DOPPLER ULTRASOUND OF THE TESTICLES  TECHNIQUE: Complete ultrasound examination of the testicles, epididymis, and other scrotal structures was performed. Color and spectral Doppler ultrasound were also utilized to evaluate blood flow to the testicles.  COMPARISON:  CT ABD - PELV W/ CM dated 05/26/2013  FINDINGS: Right testicle  Measurements: 4.6 x 1.8 x 3 cm. Tiny scattered microcalcifications are noted. Testicular parenchymal architecture is otherwise normal. No focal mass. Normal color flow signal.  Left testicle  Measurements: 4.5 x 1.7 x 3.2 cm. Tiny scattered microcalcifications are noted. Testicular parenchymal architecture is otherwise normal. No focal mass. Normal color flow signal.  Right epididymis: Small epididymal cyst or spermatocele measuring about 2 mm diameter. Normal flow.  Left epididymis:  Normal in size and appearance.  Hydrocele:  Small bilateral hydroceles are present.  Varicocele:  Small bilateral varicoceles are present.  Pulsed Doppler interrogation of both testes demonstrates low resistance arterial and venous waveforms bilaterally.  Bladder bilateral scrotal skin thickening. A superficial palpable nodule is evaluated in the right lateral testicular area. This appears to be a small complex cystic structure in the subcutaneous tissues and measures about 6 mm maximal diameter. No flow is demonstrated. Nonspecific etiology.  IMPRESSION: No testicular mass or inflammatory process demonstrated. Scattered microcalcifications noted in both testes. No evidence of torsion or epididymo-orchitis. Small bilateral hydroceles and varicoceles.  Diffuse scrotal skin thickening.   Electronically Signed   By: Lucienne Capers M.D.   On: 05/26/2013 23:04   Ct Abdomen Pelvis W Contrast  05/26/2013   CLINICAL DATA:  Possible perirectal abscess  EXAM: CT ABDOMEN AND PELVIS WITH CONTRAST  TECHNIQUE: Multidetector CT imaging of the abdomen and pelvis was performed using the standard protocol following bolus administration of intravenous contrast.  CONTRAST:  62mL OMNIPAQUE IOHEXOL 300 MG/ML SOLN, 141mL OMNIPAQUE IOHEXOL 300 MG/ML SOLN  COMPARISON:  01/21/2013  FINDINGS: Lung bases are free of acute infiltrate or sizable effusion.  The liver, gallbladder, spleen, adrenal glands and pancreas are within normal limits. The kidneys are well visualized bilaterally and again demonstrates some cystic change particularly in the left kidney. These changes are stable from the prior exam. No renal calculi or urinary tract obstructive changes are seen. Aortoiliac calcifications are noted without aneurysmal dilatation. The aorta is within normal limits. The bladder is well distended. No pelvic mass lesion or sidewall abnormality is noted. A circumaortic left renal vein is seen. Minimal inflammatory changes are noted in the buttocks. No focal abscess is seen.  IMPRESSION: No evidence of perirectal abscess.  Stable cystic change in the left kidney.   Electronically Signed   By: Inez Catalina M.D.   On: 05/26/2013 22:38   [place] Art/ven Flow Abd Pelv Doppler  05/26/2013   CLINICAL DATA:  Abscess.  EXAM: SCROTAL ULTRASOUND  DOPPLER ULTRASOUND OF THE TESTICLES  TECHNIQUE: Complete ultrasound examination of the testicles, epididymis, and other scrotal structures was performed. Color and spectral Doppler ultrasound were also utilized to evaluate blood flow to the testicles.  COMPARISON:  CT ABD - PELV W/ CM dated 05/26/2013  FINDINGS: Right testicle  Measurements: 4.6 x 1.8 x  3 cm. Tiny scattered microcalcifications are noted. Testicular parenchymal architecture is otherwise normal. No  focal mass. Normal color flow signal.  Left testicle  Measurements: 4.5 x 1.7 x 3.2 cm. Tiny scattered microcalcifications are noted. Testicular parenchymal architecture is otherwise normal. No focal mass. Normal color flow signal.  Right epididymis: Small epididymal cyst or spermatocele measuring about 2 mm diameter. Normal flow.  Left epididymis:  Normal in size and appearance.  Hydrocele:  Small bilateral hydroceles are present.  Varicocele:  Small bilateral varicoceles are present.  Pulsed Doppler interrogation of both testes demonstrates low resistance arterial and venous waveforms bilaterally.  Bladder bilateral scrotal skin thickening. A superficial palpable nodule is evaluated in the right lateral testicular area. This appears to be a small complex cystic structure in the subcutaneous tissues and measures about 6 mm maximal diameter. No flow is demonstrated. Nonspecific etiology.  IMPRESSION: No testicular mass or inflammatory process demonstrated. Scattered microcalcifications noted in both testes. No evidence of torsion or epididymo-orchitis. Small bilateral hydroceles and varicoceles. Diffuse scrotal skin thickening.   Electronically Signed   By: Lucienne Capers M.D.   On: 05/26/2013 23:04        Janice Norrie, MD 05/26/13 2353

## 2013-05-26 NOTE — ED Notes (Signed)
Pt states, "I have two boils to my rectal area" which were first noticed last night. Pt also states, "Two knots to my right testicle" NAD.

## 2013-05-26 NOTE — Discharge Instructions (Signed)
Lymphadenopathy Continue the antibiotics as prescribed. Follow up with your doctor this week. Return to the ED if you develop new or worsening symptoms. Lymphadenopathy means "disease of the lymph glands." But the term is usually used to describe swollen or enlarged lymph glands, also called lymph nodes. These are the bean-shaped organs found in many locations including the neck, underarm, and groin. Lymph glands are part of the immune system, which fights infections in your body. Lymphadenopathy can occur in just one area of the body, such as the neck, or it can be generalized, with lymph node enlargement in several areas. The nodes found in the neck are the most common sites of lymphadenopathy. CAUSES  When your immune system responds to germs (such as viruses or bacteria ), infection-fighting cells and fluid build up. This causes the glands to grow in size. This is usually not something to worry about. Sometimes, the glands themselves can become infected and inflamed. This is called lymphadenitis. Enlarged lymph nodes can be caused by many diseases:  Bacterial disease, such as strep throat or a skin infection.  Viral disease, such as a common cold.  Other germs, such as lyme disease, tuberculosis, or sexually transmitted diseases.  Cancers, such as lymphoma (cancer of the lymphatic system) or leukemia (cancer of the white blood cells).  Inflammatory diseases such as lupus or rheumatoid arthritis.  Reactions to medications. Many of the diseases above are rare, but important. This is why you should see your caregiver if you have lymphadenopathy. SYMPTOMS   Swollen, enlarged lumps in the neck, back of the head or other locations.  Tenderness.  Warmth or redness of the skin over the lymph nodes.  Fever. DIAGNOSIS  Enlarged lymph nodes are often near the source of infection. They can help healthcare providers diagnose your illness. For instance:   Swollen lymph nodes around the jaw might  be caused by an infection in the mouth.  Enlarged glands in the neck often signal a throat infection.  Lymph nodes that are swollen in more than one area often indicate an illness caused by a virus. Your caregiver most likely will know what is causing your lymphadenopathy after listening to your history and examining you. Blood tests, x-rays or other tests may be needed. If the cause of the enlarged lymph node cannot be found, and it does not go away by itself, then a biopsy may be needed. Your caregiver will discuss this with you. TREATMENT  Treatment for your enlarged lymph nodes will depend on the cause. Many times the nodes will shrink to normal size by themselves, with no treatment. Antibiotics or other medicines may be needed for infection. Only take over-the-counter or prescription medicines for pain, discomfort or fever as directed by your caregiver. HOME CARE INSTRUCTIONS  Swollen lymph glands usually return to normal when the underlying medical condition goes away. If they persist, contact your health-care provider. He/she might prescribe antibiotics or other treatments, depending on the diagnosis. Take any medications exactly as prescribed. Keep any follow-up appointments made to check on the condition of your enlarged nodes.  SEEK MEDICAL CARE IF:   Swelling lasts for more than two weeks.  You have symptoms such as weight loss, night sweats, fatigue or fever that does not go away.  The lymph nodes are hard, seem fixed to the skin or are growing rapidly.  Skin over the lymph nodes is red and inflamed. This could mean there is an infection. SEEK IMMEDIATE MEDICAL CARE IF:   Fluid starts  leaking from the area of the enlarged lymph node.  You develop a fever of 102 F (38.9 C) or greater.  Severe pain develops (not necessarily at the site of a large lymph node).  You develop chest pain or shortness of breath.  You develop worsening abdominal pain. MAKE SURE YOU:   Understand  these instructions.  Will watch your condition.  Will get help right away if you are not doing well or get worse. Document Released: 10/10/2007 Document Revised: 03/25/2011 Document Reviewed: 10/10/2007 [facility] Patient Information 2014 [place].

## 2013-05-26 NOTE — ED Provider Notes (Signed)
CSN: [id number]     Arrival date & time 05/26/13  1815 History   First MD Initiated Contact with Patient 05/26/13 2008     Chief Complaint  Patient presents with  . Abscess     (Consider location/radiation/quality/duration/timing/severity/associated sxs/prior Treatment) HPI Comments: Patient states he has had swollen lymph nodes in his groin for the past 3 or 4 months. He's been on multiple courses of antibiotics by his PCP without relief. Denies any fevers, chills, nausea, vomiting or abdominal pain. No night sweats or weight loss. He is currently on doxycycline. He comes in today because he has noticed itchy bumps to his right scrotum and perineal area. He states he's had one these lumps biopsy by urologist and was told that was "nothing". He is concerned that the lymph nodes are not getting any better. He denies any blood in the stool. He denies any bleeding or drainage. He denies any fevers or vomiting. He has bilateral testicular pain that is worse with palpation. He denies difficulty with urination or discharge from his penis.   The history is provided by the patient.    Past Medical History  Diagnosis Date  . Hypertension    Past Surgical History  Procedure Laterality Date  . Cervical spine surgery  2009  . Colonoscopy    . Evaluation under anesthesia with hemorrhoidectomy  12/20/2011    ++++++++++++  . +++++++++++++++++++++++++++++++++++++++++++++++++++++     Family History  Problem Relation Age of Onset  . Hypertension Mother   . Hypertension Father   . Alcohol abuse Brother   . Drug abuse Brother    History  Substance Use Topics  . Smoking status: Current Every Day Smoker -- 1.00 packs/day    Types: Cigarettes  . Smokeless tobacco: Never Used  . Alcohol Use: Yes     Comment: occasional beer, liquor and wine    Review of Systems  Constitutional: Negative for fever, activity change and fatigue.  HENT: Negative for sore throat.   Respiratory: Negative for cough,  chest tightness and shortness of breath.   Cardiovascular: Negative for chest pain.  Gastrointestinal: Negative for nausea, vomiting and abdominal pain.  Genitourinary: Positive for testicular pain. Negative for dysuria, hematuria, flank pain and scrotal swelling.  Musculoskeletal: Negative for back pain.  Skin: Negative for rash.  Neurological: Negative for weakness and headaches.  Hematological: Negative for adenopathy.  Psychiatric/Behavioral: Negative for agitation.  A complete 10 system review of systems was obtained and all systems are negative except as noted in the HPI and PMH.      Allergies  Review of patient's allergies indicates no known allergies.  Home Medications   Prior to Admission medications   Medication Sig Start Date End Date Taking? Authorizing Provider  amLODipine (NORVASC) 5 MG tablet Take 5 mg by mouth daily as needed (for high blood pressure).  01/27/13  Yes Historical Provider, MD  doxycycline (VIBRA-TABS) 100 MG tablet Take 100 mg by mouth 2 (two) times daily.   Yes Historical Provider, MD  lisinopril-hydrochlorothiazide (PRINZIDE,ZESTORETIC) 20-25 MG per tablet Take 1 tablet by mouth daily. 03/03/13 03/03/14 Yes Historical Provider, MD  mupirocin ointment (BACTROBAN) 2 % Apply 1 application topically 3 (three) times daily. 05/18/13  Yes Historical Provider, MD  oxyCODONE-acetaminophen (PERCOCET) 10-325 MG per tablet Take 1 tablet by mouth every 4 (four) hours as needed. 02/05/13  Yes Historical Provider, MD   BP 128/70  Pulse 87  Temp(Src) 97.8 F (36.6 C) (Oral)  Resp 16  Ht 5\' 8"  (1.727  m)  Wt 170 lb (77.111 kg)  BMI 25.85 kg/m2  SpO2 99% Physical Exam  Constitutional: He is oriented to person, place, and time. He appears well-nourished. No distress.  HENT:  Head: Normocephalic and atraumatic.  Mouth/Throat: Oropharynx is clear and moist. No oropharyngeal exudate.  Eyes: Conjunctivae and EOM are normal. Pupils are equal, round, and reactive to light.   Neck: Normal range of motion. Neck supple.  Cardiovascular: Normal rate, regular rhythm and normal heart sounds.   Pulmonary/Chest: Effort normal and breath sounds normal. No respiratory distress.  Abdominal: Soft. There is no tenderness. There is no rebound and no guarding.  Tender inguinal lymphadenopathy bilaterally  Genitourinary:  Bilateral testicular tenderness. Normal lie. Several flesh-colored nodules to the right hemiscrotum. No erythema or fluctuance.  Excessive soft tissue around the anus, likely from previous hemorrhoidectomy. No fluctuance.  Musculoskeletal: He exhibits no edema and no tenderness.  Neurological: He is alert and oriented to person, place, and time. No cranial nerve deficit. He exhibits normal muscle tone. Coordination normal.  Skin: Skin is warm.    ED Course  Procedures (including critical care time) Labs Review Labs Reviewed  CBC WITH DIFFERENTIAL - Abnormal; Notable for the following:    HCT 38.9 (*)    Lymphs Abs 4.3 (*)    All other components within normal limits  COMPREHENSIVE METABOLIC PANEL - Abnormal; Notable for the following:    Potassium 3.6 (*)    Glucose, Bld 102 (*)    All other components within normal limits  URINALYSIS, ROUTINE W REFLEX MICROSCOPIC - Abnormal; Notable for the following:    Hgb urine dipstick SMALL (*)    All other components within normal limits  URINE MICROSCOPIC-ADD ON    Imaging Review No results found.   EKG Interpretation None      MDM   Final diagnoses:  Inguinal lymphadenopathy   Tender inguinal lymph nodes for the past 4 months. No fevers, chills, nausea vomiting. No night sweats or weight loss. Patient has had multiple courses of antibiotics.  Urinalysis is negative. Labs are unremarkable.  Patient has significant testicular tenderness to palpation. We'll obtain ultrasound. No evidence of abscess on rectal exam No abscess noted on scrotum.  We'll obtain abdominal imaging given ongoing  lymph nodes. Ultrasound and CT scan pending at time of sign out to Dr. Tomi Bamberger.     Ezequiel Essex, MD 05/26/13 2226

## 2013-07-24 ENCOUNTER — Encounter (HOSPITAL_COMMUNITY): Payer: Self-pay | Admitting: Emergency Medicine

## 2013-07-24 ENCOUNTER — Emergency Department (HOSPITAL_COMMUNITY)
Admission: EM | Admit: 2013-07-24 | Discharge: 2013-07-24 | Disposition: A | Discharge status: Home / Self Care | Payer: BC Managed Care – PPO | Attending: Emergency Medicine | Admitting: Emergency Medicine

## 2013-07-24 DIAGNOSIS — N509 Disorder of male genital organs, unspecified: Secondary | ICD-10-CM | POA: Insufficient documentation

## 2013-07-24 DIAGNOSIS — I10 Essential (primary) hypertension: Secondary | ICD-10-CM | POA: Insufficient documentation

## 2013-07-24 DIAGNOSIS — Z792 Long term (current) use of antibiotics: Secondary | ICD-10-CM | POA: Insufficient documentation

## 2013-07-24 DIAGNOSIS — N50811 Right testicular pain: Secondary | ICD-10-CM

## 2013-07-24 DIAGNOSIS — F17200 Nicotine dependence, unspecified, uncomplicated: Secondary | ICD-10-CM | POA: Insufficient documentation

## 2013-07-24 DIAGNOSIS — Z79899 Other long term (current) drug therapy: Secondary | ICD-10-CM | POA: Insufficient documentation

## 2013-07-24 DIAGNOSIS — N50812 Left testicular pain: Secondary | ICD-10-CM

## 2013-07-24 LAB — URINE MICROSCOPIC-ADD ON

## 2013-07-24 LAB — URINALYSIS, ROUTINE W REFLEX MICROSCOPIC
Bilirubin Urine: NEGATIVE
GLUCOSE, UA: NEGATIVE mg/dL
Ketones, ur: NEGATIVE mg/dL
Leukocytes, UA: NEGATIVE
Nitrite: NEGATIVE
PH: 5.5 (ref 5.0–8.0)
Protein, ur: NEGATIVE mg/dL
SPECIFIC GRAVITY, URINE: 1.02 (ref 1.005–1.030)
Urobilinogen, UA: 0.2 mg/dL (ref 0.0–1.0)

## 2013-07-24 MED ORDER — CIPROFLOXACIN HCL 500 MG PO TABS: 500.0000 mg | ORAL_TABLET | Freq: Two times a day (BID) | ORAL | Status: DC

## 2013-07-24 MED ORDER — OXYCODONE-ACETAMINOPHEN 5-325 MG PO TABS
2.0000 | ORAL_TABLET | Freq: Once | ORAL | Status: AC
  Administered 2013-07-24: 2 via ORAL
  Filled 2013-07-24: qty 2

## 2013-07-24 MED ORDER — OXYCODONE-ACETAMINOPHEN 5-325 MG PO TABS: 2.0000 | ORAL_TABLET | ORAL | Status: DC | PRN

## 2013-07-24 NOTE — ED Notes (Signed)
Patient with no complaints at this time. Respirations even and unlabored. Skin warm/dry. Discharge instructions reviewed with patient at this time. Patient given opportunity to voice concerns/ask questions. Patient discharged at this time and left Emergency Department with steady gait.   

## 2013-07-24 NOTE — ED Provider Notes (Signed)
CSN: [id number]     Arrival date & time 07/24/13  1128 History   First MD Initiated Contact with Patient 07/24/13 1144     This chart was scribed for Nat Christen, MD by Forrestine Him, ED Scribe. This patient was seen in room APA01/APA01 and the patient's care was started 11:47 AM.   Chief Complaint  Patient presents with  . Testicle Pain   The history is provided by the patient. No language interpreter was used.    HPI Comments: Benjamin Solomon is a 48 y.o. male with a PMHx of HTN who presents to the Emergency Department complaining of constant, moderate bilateral testicle pain x 7 months that is progressively worsening. No dysuria, penile discharge, or penile pain at this time. Pt has been evaluated by Dr. Michela Pitcher for this concern and was placed on 5-6 different antibiotic since onset of symptoms without any improvement. In addition, pt states X-Rays, CTs, and prostate screenings have been performed without any abnormal findings. He is scheduled to follow with Urology at [facility] 8/10. No fever or chills. He has no pertinent past medical history. No other concerns this visit.   Past Medical History  Diagnosis Date  . Hypertension    Past Surgical History  Procedure Laterality Date  . Cervical spine surgery  2009  . Colonoscopy    . Evaluation under anesthesia with hemorrhoidectomy  12/20/2011    ++++++++++++  . +++++++++++++++++++++++++++++++++++++++++++++++++++++     Family History  Problem Relation Age of Onset  . Hypertension Mother   . Hypertension Father   . Alcohol abuse Brother   . Drug abuse Brother    History  Substance Use Topics  . Smoking status: Current Every Day Smoker -- 1.00 packs/day    Types: Cigarettes  . Smokeless tobacco: Never Used  . Alcohol Use: Yes     Comment: occasional beer, liquor and wine    Review of Systems  Constitutional: Negative for fever and chills.  Genitourinary: Positive for testicular pain. Negative for dysuria, hematuria,  discharge, penile swelling, difficulty urinating and penile pain.  Skin: Negative for rash.      Allergies  Review of patient's allergies indicates no known allergies.  Home Medications   Prior to Admission medications   Medication Sig Start Date End Date Taking? Authorizing Provider  acetaminophen (TYLENOL) 500 MG tablet Take 1,000 mg by mouth every 6 (six) hours as needed for mild pain.   Yes Historical Provider, MD  amLODipine (NORVASC) 5 MG tablet Take 5 mg by mouth daily as needed (for high blood pressure).  01/27/13  Yes Historical Provider, MD  ibuprofen (ADVIL,MOTRIN) 200 MG tablet Take 200 mg by mouth every 6 (six) hours as needed for mild pain.   Yes Historical Provider, MD  lisinopril-hydrochlorothiazide (PRINZIDE,ZESTORETIC) 20-25 MG per tablet Take 1 tablet by mouth daily. 03/03/13 03/03/14 Yes Historical Provider, MD  Multiple Vitamin (MULTIVITAMIN WITH MINERALS) TABS tablet Take 1 tablet by mouth daily.   Yes Historical Provider, MD  tetrahydrozoline 0.05 % ophthalmic solution Place 1 drop into both eyes as needed (Dry Eyes).   Yes Historical Provider, MD  ciprofloxacin (CIPRO) 500 MG tablet Take 1 tablet (500 mg total) by mouth 2 (two) times daily. 07/24/13   Nat Christen, MD  oxyCODONE-acetaminophen (PERCOCET) 5-325 MG per tablet Take 2 tablets by mouth every 4 (four) hours as needed. 07/24/13   Nat Christen, MD   Triage Vitals: BP 143/102  Pulse 113  Temp(Src) 98.5 F (36.9 C) (Oral)  Resp  16  Ht 5\' 8"  (1.727 m)  Wt 170 lb (77.111 kg)  BMI 25.85 kg/m2  SpO2 100%   Physical Exam  Nursing note and vitals reviewed. Constitutional: He is oriented to person, place, and time. He appears well-developed and well-nourished.  HENT:  Head: Normocephalic and atraumatic.  Eyes: Conjunctivae and EOM are normal. Pupils are equal, round, and reactive to light.  Neck: Normal range of motion. Neck supple.  Cardiovascular: Normal rate, regular rhythm and normal heart sounds.    Pulmonary/Chest: Effort normal and breath sounds normal.  Abdominal: Soft. Bowel sounds are normal.  Genitourinary:  Minimal inguinal adenopathy Testicles an epididymis minimally tender bilaterally but no masses Penis non tender   Musculoskeletal: Normal range of motion.  Neurological: He is alert and oriented to person, place, and time.  Skin: Skin is warm and dry.  Psychiatric: He has a normal mood and affect. His behavior is normal.    ED Course  Procedures (including critical care time)  DIAGNOSTIC STUDIES: Oxygen Saturation is 100% on RA, Normal by my interpretation.    COORDINATION OF CARE: 11:45 AM- Will give Percocet to manage symptoms. Discussed treatment plan with pt at bedside and pt agreed to plan.     Labs Review Labs Reviewed  URINALYSIS, ROUTINE W REFLEX MICROSCOPIC - Abnormal; Notable for the following:    Hgb urine dipstick SMALL (*)    All other components within normal limits  URINE MICROSCOPIC-ADD ON    Imaging Review No results found.   EKG Interpretation None      MDM   Final diagnoses:  Testicular pain, left  Testicular pain, right   Urinalysis shows a small amount of hemoglobin. Review of review of previous imaging from 05/26/2012 reveals a normal CT abdomen/ pelvis.  Ultrasound from the same date reveals  bilateral hydroceles and varicoceles, but no masses or inflammation. Discharge medications Cipro 500 mg and Percocet. Patient has urology followup at [facility]  I personally performed the services described in this documentation, which was scribed in my presence. The recorded information has been reviewed and is accurate.    Nat Christen, MD 07/25/13 1438

## 2013-07-24 NOTE — ED Notes (Signed)
Pain in bilateral testicles, worse w/sitting or standing, better when reclining.  Past US shows bilateral varioceles and hydroceles.  Patient has appointment 09/03/2103 at [facility] urology.

## 2013-07-24 NOTE — ED Notes (Signed)
Pt c/o testicle pain for the past few months, has been seeing Dr Michela Pitcher for the pain, was placed on antibiotics, pain medication last month with no change in pain, is scheduled to see urology department at [facility] on 08/23/2013, pt states that the pain has become too much for him to wait that long.

## 2013-07-24 NOTE — Discharge Instructions (Signed)
Prescriptions for antibiotic and pain medicine. Can also take ibuprofen 3-4 tablets 2 times a day. Followup with urologist at [facility]

## 2013-08-20 ENCOUNTER — Encounter (HOSPITAL_COMMUNITY): Payer: Self-pay | Admitting: Emergency Medicine

## 2013-08-20 ENCOUNTER — Emergency Department (HOSPITAL_COMMUNITY)
Admission: EM | Admit: 2013-08-20 | Discharge: 2013-08-20 | Disposition: A | Discharge status: Home / Self Care | Payer: BC Managed Care – PPO | Attending: Emergency Medicine | Admitting: Emergency Medicine

## 2013-08-20 DIAGNOSIS — I10 Essential (primary) hypertension: Secondary | ICD-10-CM | POA: Insufficient documentation

## 2013-08-20 DIAGNOSIS — Z792 Long term (current) use of antibiotics: Secondary | ICD-10-CM | POA: Insufficient documentation

## 2013-08-20 DIAGNOSIS — N509 Disorder of male genital organs, unspecified: Secondary | ICD-10-CM | POA: Insufficient documentation

## 2013-08-20 DIAGNOSIS — N50819 Testicular pain, unspecified: Secondary | ICD-10-CM

## 2013-08-20 DIAGNOSIS — F17200 Nicotine dependence, unspecified, uncomplicated: Secondary | ICD-10-CM | POA: Insufficient documentation

## 2013-08-20 DIAGNOSIS — Z79899 Other long term (current) drug therapy: Secondary | ICD-10-CM | POA: Insufficient documentation

## 2013-08-20 DIAGNOSIS — Z791 Long term (current) use of non-steroidal anti-inflammatories (NSAID): Secondary | ICD-10-CM | POA: Insufficient documentation

## 2013-08-20 MED ORDER — OXYCODONE-ACETAMINOPHEN 5-325 MG PO TABS
2.0000 | ORAL_TABLET | Freq: Once | ORAL | Status: AC
  Administered 2013-08-20: 2 via ORAL
  Filled 2013-08-20: qty 2

## 2013-08-20 MED ORDER — OXYCODONE-ACETAMINOPHEN 5-325 MG PO TABS: 1.0000 | ORAL_TABLET | ORAL | Status: DC | PRN

## 2013-08-20 MED ORDER — CIPROFLOXACIN HCL 500 MG PO TABS: 500.0000 mg | ORAL_TABLET | Freq: Two times a day (BID) | ORAL | Status: DC

## 2013-08-20 MED ORDER — NAPROXEN 500 MG PO TABS: 500.0000 mg | ORAL_TABLET | Freq: Two times a day (BID) | ORAL | Status: DC

## 2013-08-20 NOTE — ED Notes (Addendum)
Pt reporting testicular pain for "quite a while".  Was seen and treated for same aprox 3 weeks ago.  Pt reports appointment with urologist [facility] scheduled for Monday.

## 2013-08-20 NOTE — ED Provider Notes (Signed)
48 year old male with a history of recurrent testicular pain. He states that he has been treated for pain in his testicles with pain medication and antibiotics and seems to get better over time but after going off of the antibiotics the pain comes back. This pain has been gradually worsening, is located in the bilateral scrotal and is not associated with fevers swelling or penile age. On exam the patient has a soft abdomen, no lymphadenopathy the carotid, normal appearing scrotum and circumcised penis, normal testicles in appearance however there is bilateral mild tenderness without masses palpated.  The patient has had a normal ultrasound and a CT scan of the abdomen neither of which showed a pathologic findings, he'll followup with a specialist in 3 days at [facility] urology. We'll restart ciprofloxacin, pain medication, stable for discharge. The patient has had a very thorough workup in the past couple of months do not see the need to go over and repeat the same tests again this evening in the absence of any signs of orchitis, epididymitis or testicular torsion.   Medical screening examination/treatment/procedure(s) were conducted as a shared visit with non-physician practitioner(s) and myself.  I personally evaluated the patient during the encounter.  Clinical Impression: Testicular pain  Johnna Acosta, MD 08/20/13 [phone]

## 2013-08-20 NOTE — ED Provider Notes (Signed)
Medical screening examination/treatment/procedure(s) were conducted as a shared visit with non-physician practitioner(s) and myself.  I personally evaluated the patient during the encounter  Please see my separate respective documentation pertaining to this patient encounter   Johnna Acosta, MD 08/20/13 [phone]

## 2013-08-20 NOTE — ED Provider Notes (Signed)
CSN: [id number]     Arrival date & time 08/20/13  0502 History   First MD Initiated Contact with Patient 08/20/13 [phone]     No chief complaint on file.    (Consider location/radiation/quality/duration/timing/severity/associated sxs/prior Treatment) HPI Benjamin Solomon is a 48 y.o. male who presents with bilat testicle pain. This is the same pain he was seen for here on 7/11 and the same pain he has experienced for the past 7 months.  He states he has an appointment with Urology at [facility] on Monday. He also says he has had multiple xrays, ct scans and ultrasounds that have not shown anything. He complains that he has had epididymitis and he feels better when he goes on antibiotics, but as soon as the course is through, the pain returns. He denies any penile pain, discharge, dysuria, fevers, chills, abdominal pain.   Past Medical History  Diagnosis Date  . Hypertension    Past Surgical History  Procedure Laterality Date  . Cervical spine surgery  2009  . Colonoscopy    . Evaluation under anesthesia with hemorrhoidectomy  12/20/2011    ++++++++++++  . +++++++++++++++++++++++++++++++++++++++++++++++++++++     Family History  Problem Relation Age of Onset  . Hypertension Mother   . Hypertension Father   . Alcohol abuse Brother   . Drug abuse Brother    History  Substance Use Topics  . Smoking status: Current Every Day Smoker -- 1.00 packs/day    Types: Cigarettes  . Smokeless tobacco: Never Used  . Alcohol Use: Yes     Comment: occasional beer, liquor and wine    Review of Systems  Constitutional: Negative for fever and chills.  Cardiovascular: Negative for chest pain and leg swelling.  Gastrointestinal: Negative for abdominal pain.  Genitourinary: Positive for testicular pain. Negative for dysuria, hematuria, discharge and difficulty urinating.      Allergies  Review of patient's allergies indicates no known allergies.  Home Medications   Prior to Admission  medications   Medication Sig Start Date End Date Taking? Authorizing Provider  acetaminophen (TYLENOL) 500 MG tablet Take 1,000 mg by mouth every 6 (six) hours as needed for mild pain.    Historical Provider, MD  amLODipine (NORVASC) 5 MG tablet Take 5 mg by mouth daily as needed (for high blood pressure).  01/27/13   Historical Provider, MD  ciprofloxacin (CIPRO) 500 MG tablet Take 1 tablet (500 mg total) by mouth 2 (two) times daily. 07/24/13   Nat Christen, MD  ciprofloxacin (CIPRO) 500 MG tablet Take 1 tablet (500 mg total) by mouth every 12 (twelve) hours. 08/20/13   Johnna Acosta, MD  ibuprofen (ADVIL,MOTRIN) 200 MG tablet Take 200 mg by mouth every 6 (six) hours as needed for mild pain.    Historical Provider, MD  lisinopril-hydrochlorothiazide (PRINZIDE,ZESTORETIC) 20-25 MG per tablet Take 1 tablet by mouth daily. 03/03/13 03/03/14  Historical Provider, MD  Multiple Vitamin (MULTIVITAMIN WITH MINERALS) TABS tablet Take 1 tablet by mouth daily.    Historical Provider, MD  naproxen (NAPROSYN) 500 MG tablet Take 1 tablet (500 mg total) by mouth 2 (two) times daily with a meal. 08/20/13   Johnna Acosta, MD  oxyCODONE-acetaminophen (PERCOCET) 5-325 MG per tablet Take 2 tablets by mouth every 4 (four) hours as needed. 07/24/13   Nat Christen, MD  oxyCODONE-acetaminophen (PERCOCET) 5-325 MG per tablet Take 1 tablet by mouth every 4 (four) hours as needed. 08/20/13   Johnna Acosta, MD  tetrahydrozoline 0.05 % ophthalmic solution  Place 1 drop into both eyes as needed (Dry Eyes).    Historical Provider, MD   BP 146/100  Pulse 96  Temp(Src) 98.2 F (36.8 C) (Oral)  Resp 18  Ht 5\' 8"  (1.727 m)  Wt 170 lb (77.111 kg)  BMI 25.85 kg/m2  SpO2 100% Physical Exam  Nursing note and vitals reviewed. Constitutional: He is oriented to person, place, and time. He appears well-developed and well-nourished.  Eyes: Conjunctivae and EOM are normal. Right eye exhibits no discharge. Left eye exhibits no discharge.   Cardiovascular: Normal rate, regular rhythm and normal heart sounds.   Pulmonary/Chest: Effort normal and breath sounds normal. No respiratory distress.  Abdominal: Soft. There is no tenderness.  Genitourinary: Penis normal.  Bilat. testical tenderness with no appreciable masses, lesions, rashes, or deformities.  No warmth or erythema to genitalia  Musculoskeletal: Normal range of motion.  Neurological: He is alert and oriented to person, place, and time.  Skin: Skin is warm and dry.    ED Course  Procedures (including critical care time) Labs Review Labs Reviewed - No data to display  Imaging Review No results found.   EKG Interpretation None       MDM  Pt comfortable and stable in ED Same pain he has had for 7 mos with no known cause Physical Exam not concerning for any new, obvious pathology at this time Pt has appt with Urology he intends to keep for Monday Will DC with Cipro and Percocet as bridge to Urology appt.  Final diagnoses:  Testicle pain   Meds given in ED:  Medications  oxyCODONE-acetaminophen (PERCOCET/ROXICET) 5-325 MG per tablet 2 tablet (2 tablets Oral Given 08/20/13 0601)    New Prescriptions   CIPROFLOXACIN (CIPRO) 500 MG TABLET    Take 1 tablet (500 mg total) by mouth every 12 (twelve) hours.   NAPROXEN (NAPROSYN) 500 MG TABLET    Take 1 tablet (500 mg total) by mouth 2 (two) times daily with a meal.   OXYCODONE-ACETAMINOPHEN (PERCOCET) 5-325 MG PER TABLET    Take 1 tablet by mouth every 4 (four) hours as needed.   Prior to patient discharge, I discussed and reviewed this case with Dr.Brian Acquanetta Sit, PA-C 08/20/13 [phone]

## 2013-08-23 ENCOUNTER — Other Ambulatory Visit: Payer: Self-pay | Admitting: Urology

## 2013-08-23 ENCOUNTER — Ambulatory Visit
Admission: RE | Admit: 2013-08-23 | Discharge: 2013-08-23 | Disposition: A | Discharge status: Home / Self Care | Payer: BC Managed Care – PPO | Source: Ambulatory Visit | Attending: Urology | Admitting: Urology

## 2013-08-23 DIAGNOSIS — N50819 Testicular pain, unspecified: Secondary | ICD-10-CM

## 2013-08-23 MED ORDER — GADOBENATE DIMEGLUMINE 529 MG/ML IV SOLN
16.0000 mL | Freq: Once | INTRAVENOUS | Status: AC | PRN
Start: 2013-08-23 — End: 2013-08-23
  Administered 2013-08-23: 16 mL via INTRAVENOUS

## 2013-10-04 ENCOUNTER — Other Ambulatory Visit: Payer: Self-pay | Admitting: Pain Medicine

## 2013-10-04 DIAGNOSIS — M545 Low back pain: Secondary | ICD-10-CM

## 2013-10-10 ENCOUNTER — Ambulatory Visit
Admission: RE | Admit: 2013-10-10 | Discharge: 2013-10-10 | Disposition: A | Discharge status: Home / Self Care | Payer: BC Managed Care – PPO | Source: Ambulatory Visit | Attending: Pain Medicine | Admitting: Pain Medicine

## 2013-10-10 DIAGNOSIS — M545 Low back pain: Secondary | ICD-10-CM

## 2013-11-13 ENCOUNTER — Encounter (HOSPITAL_COMMUNITY): Payer: Self-pay | Admitting: Emergency Medicine

## 2013-11-13 ENCOUNTER — Emergency Department (HOSPITAL_COMMUNITY): Payer: BC Managed Care – PPO

## 2013-11-13 ENCOUNTER — Emergency Department (HOSPITAL_COMMUNITY)
Admission: EM | Admit: 2013-11-13 | Discharge: 2013-11-13 | Disposition: A | Discharge status: Other Acute Inpatient Hospital | Payer: BC Managed Care – PPO | Attending: Emergency Medicine | Admitting: Emergency Medicine

## 2013-11-13 DIAGNOSIS — I10 Essential (primary) hypertension: Secondary | ICD-10-CM | POA: Diagnosis not present

## 2013-11-13 DIAGNOSIS — R1032 Left lower quadrant pain: Secondary | ICD-10-CM

## 2013-11-13 DIAGNOSIS — R1084 Generalized abdominal pain: Secondary | ICD-10-CM | POA: Insufficient documentation

## 2013-11-13 LAB — URINALYSIS, ROUTINE W REFLEX MICROSCOPIC
Bilirubin Urine: NEGATIVE
GLUCOSE, UA: NEGATIVE mg/dL
Ketones, ur: NEGATIVE mg/dL
Leukocytes, UA: NEGATIVE
Nitrite: NEGATIVE
Protein, ur: NEGATIVE mg/dL
Specific Gravity, Urine: 1.01 (ref 1.005–1.030)
Urobilinogen, UA: 0.2 mg/dL (ref 0.0–1.0)
pH: 5 (ref 5.0–8.0)

## 2013-11-13 LAB — COMPREHENSIVE METABOLIC PANEL
ALBUMIN: 3.5 g/dL (ref 3.5–5.2)
ALK PHOS: 101 U/L (ref 39–117)
ALT: 15 U/L (ref 0–53)
AST: 15 U/L (ref 0–37)
Anion gap: 14 (ref 5–15)
BILIRUBIN TOTAL: 0.3 mg/dL (ref 0.3–1.2)
BUN: 17 mg/dL (ref 6–23)
CHLORIDE: 102 meq/L (ref 96–112)
CO2: 23 mEq/L (ref 19–32)
Calcium: 9.1 mg/dL (ref 8.4–10.5)
Creatinine, Ser: 1.24 mg/dL (ref 0.50–1.35)
GFR calc Af Amer: 77 mL/min — ABNORMAL LOW (ref >90)
GFR calc non Af Amer: 66 mL/min — ABNORMAL LOW (ref >90)
Glucose, Bld: 142 mg/dL — ABNORMAL HIGH (ref 70–99)
POTASSIUM: 3.9 meq/L (ref 3.7–5.3)
Sodium: 139 mEq/L (ref 137–147)
Total Protein: 7.2 g/dL (ref 6.0–8.3)

## 2013-11-13 LAB — CBC WITH DIFFERENTIAL/PLATELET
BASOS PCT: 0 % (ref 0–1)
Basophils Absolute: 0 10*3/uL (ref 0.0–0.1)
EOS ABS: 4.3 10*3/uL — AB (ref 0.0–0.7)
Eosinophils Relative: 26 % — ABNORMAL HIGH (ref 0–5)
HCT: 38.3 % — ABNORMAL LOW (ref 39.0–52.0)
HEMOGLOBIN: 13.2 g/dL (ref 13.0–17.0)
LYMPHS PCT: 26 % (ref 12–46)
Lymphs Abs: 4.3 10*3/uL — ABNORMAL HIGH (ref 0.7–4.0)
MCH: 30.5 pg (ref 26.0–34.0)
MCHC: 34.5 g/dL (ref 30.0–36.0)
MCV: 88.5 fL (ref 78.0–100.0)
MONOS PCT: 5 % (ref 3–12)
Monocytes Absolute: 0.8 10*3/uL (ref 0.1–1.0)
NEUTROS PCT: 43 % (ref 43–77)
Neutro Abs: 7.1 10*3/uL (ref 1.7–7.7)
Platelets: 251 10*3/uL (ref 150–400)
RBC: 4.33 MIL/uL (ref 4.22–5.81)
RDW: 14.6 % (ref 11.5–15.5)
WBC: 16.5 10*3/uL — AB (ref 4.0–10.5)

## 2013-11-13 LAB — URINE MICROSCOPIC-ADD ON

## 2013-11-13 LAB — POC OCCULT BLOOD, ED: Fecal Occult Bld: NEGATIVE

## 2013-11-13 LAB — LIPASE, BLOOD: Lipase: 54 U/L (ref 11–59)

## 2013-11-13 MED ORDER — HYDROMORPHONE HCL 1 MG/ML IJ SOLN
1.0000 mg | Freq: Once | INTRAMUSCULAR | Status: AC
  Administered 2013-11-13: 1 mg via INTRAVENOUS
  Filled 2013-11-13: qty 1

## 2013-11-13 MED ORDER — IOHEXOL 300 MG/ML  SOLN
100.0000 mL | Freq: Once | INTRAMUSCULAR | Status: AC | PRN
  Administered 2013-11-13: 100 mL via INTRAVENOUS

## 2013-11-13 MED ORDER — SODIUM CHLORIDE 0.9 % IV SOLN
Freq: Once | INTRAVENOUS | Status: AC
  Administered 2013-11-13: 09:00:00 via INTRAVENOUS

## 2013-11-13 MED ORDER — HYDROCODONE-ACETAMINOPHEN 5-325 MG PO TABS: 1.0000 | ORAL_TABLET | Freq: Four times a day (QID) | ORAL | Status: DC | PRN

## 2013-11-13 MED ORDER — ONDANSETRON HCL 4 MG PO TABS: 4.0000 mg | ORAL_TABLET | Freq: Four times a day (QID) | ORAL | Status: DC

## 2013-11-13 MED ORDER — SODIUM CHLORIDE 0.9 % IV BOLUS (SEPSIS)
500.0000 mL | Freq: Once | INTRAVENOUS | Status: AC
  Administered 2013-11-13: 500 mL via INTRAVENOUS

## 2013-11-13 NOTE — ED Provider Notes (Signed)
Medical screening examination/treatment/procedure(s) were conducted as a shared visit with non-physician practitioner(s) and myself.  I personally evaluated the patient during the encounter.  Few days gradual onset well localized left lower quadrant pain with localized moderate tenderness left lower quadrant without rebound without prior history of diverticulitis so feel CT scan reasonable if U/A unremarkable since CT scan broken may have to transfer for CT.  Pt seen and agrees to transfer since no CT at [facility]. The patient appears reasonably stabilized for transfer considering the current resources, flow, and capabilities available in the ED at this time, and I doubt any other [facility] requiring further screening and/or treatment in the ED prior to transfer. Lenoir Brihana Quickel, MD 11/14/13 [phone]

## 2013-11-13 NOTE — ED Notes (Signed)
Patient transported to CT 

## 2013-11-13 NOTE — Discharge Instructions (Signed)

## 2013-11-13 NOTE — ED Notes (Signed)
Patient c/o abd pain in navel region since Tuesday. Per patient nausea, vomiting, and fevers. Patient reports having diarrhea but states that stools are now dark and tarry since yesterday. No active vomiting noted at this time.

## 2013-11-13 NOTE — ED Provider Notes (Signed)
CSN: [id number]     Arrival date & time 11/13/13  0802 History   First MD Initiated Contact with Patient 11/13/13 [phone]     Chief Complaint  Patient presents with  . Abdominal Pain     (Consider location/radiation/quality/duration/timing/severity/associated sxs/prior Treatment) Patient is a 48 y.o. male presenting with abdominal pain. The history is provided by the patient.  Abdominal Pain Pain location:  Periumbilical Pain quality: aching   Pain radiates to:  Does not radiate Pain severity:  Moderate Onset quality:  Gradual Duration:  5 days Timing:  Constant Progression:  Unchanged Chronicity:  New Context: awakening from sleep   Relieved by:  Nothing Worsened by:  Movement Ineffective treatments:  OTC medications Associated symptoms: anorexia, chills, diarrhea (2 x day), fever, nausea and shortness of breath   Associated symptoms: no chest pain, no constipation and no dysuria    GUSTABO GORDILLO is a 48 y.o. male who presents to the ED with abdominal pain that started 5 days ago that is around the navel. He reports nausea, vomiting, diarrhea and fever. He took Pepto one day and then had what he thought was blood in his stools. He decided to come in today because the pain was not getting any better.    Past Medical History  Diagnosis Date  . Hypertension    Past Surgical History  Procedure Laterality Date  . Cervical spine surgery  2009  . Colonoscopy    . Evaluation under anesthesia with hemorrhoidectomy  12/20/2011    ++++++++++++  . +++++++++++++++++++++++++++++++++++++++++++++++++++++     Family History  Problem Relation Age of Onset  . Hypertension Mother   . Hypertension Father   . Alcohol abuse Brother   . Drug abuse Brother    History  Substance Use Topics  . Smoking status: Current Every Day Smoker -- 1.00 packs/day for 20 years    Types: Cigarettes  . Smokeless tobacco: Never Used  . Alcohol Use: Yes     Comment: occasional beer, liquor and wine     Review of Systems  Constitutional: Positive for fever and chills.  Eyes: Negative for redness and visual disturbance.  Respiratory: Positive for shortness of breath.   Cardiovascular: Negative for chest pain.  Gastrointestinal: Positive for nausea, abdominal pain, diarrhea (2 x day) and anorexia. Negative for constipation.  Genitourinary: Positive for testicular pain (chronic). Negative for dysuria, frequency, decreased urine volume, discharge, scrotal swelling and penile pain.  Musculoskeletal: Positive for back pain (right). Negative for joint swelling, myalgias and neck pain.  Skin: Negative for rash.  Neurological: Positive for headaches. Negative for dizziness and syncope.  Psychiatric/Behavioral: Negative for confusion. The patient is not nervous/anxious.       Allergies  Review of patient's allergies indicates no known allergies.  Home Medications   Prior to Admission medications   Medication Sig Start Date End Date Taking? Authorizing Provider  amLODipine (NORVASC) 5 MG tablet Take 5 mg by mouth daily as needed (for high blood pressure).  01/27/13  Yes Historical Provider, MD  lisinopril-hydrochlorothiazide (PRINZIDE,ZESTORETIC) 20-25 MG per tablet Take 1 tablet by mouth daily. 03/03/13 03/03/14 Yes Historical Provider, MD  oxyCODONE-acetaminophen (PERCOCET) 10-325 MG per tablet Take 1 tablet by mouth every 4 (four) hours as needed for pain.   Yes Historical Provider, MD  tetrahydrozoline 0.05 % ophthalmic solution Place 2 drops into both eyes daily as needed (dry eyes).   Yes Historical Provider, MD   BP 118/78  Pulse 86  Temp(Src) 98.3 F (36.8 C) (Oral)  Resp 18  Ht 5\' 8"  (1.727 m)  Wt 170 lb (77.111 kg)  BMI 25.85 kg/m2  SpO2 100% Physical Exam  Nursing note and vitals reviewed. Constitutional: He is oriented to person, place, and time. He appears well-developed and well-nourished. No distress.  HENT:  Head: Normocephalic.  Eyes: EOM are normal.  Neck: Neck  supple.  Cardiovascular: Regular rhythm.  Tachycardia present.   Pulmonary/Chest: Effort normal and breath sounds normal.  Abdominal: Soft. Bowel sounds are normal. There is tenderness in the periumbilical area, left upper quadrant and left lower quadrant. There is no rebound, no guarding and no CVA tenderness.  Genitourinary: Rectal exam shows no internal hemorrhoid, no fissure, no mass, no tenderness and anal tone normal. Guaiac negative stool.  Black stool possible due to Pepto  Musculoskeletal: Normal range of motion.  Neurological: He is alert and oriented to person, place, and time. No cranial nerve deficit.  Skin: Skin is warm and dry.  Psychiatric: He has a normal mood and affect. His behavior is normal.    ED Course  Procedures (including critical care time) Labs ReviewImaging Review Results for orders placed during the hospital encounter of 11/13/13 (from the past 24 hour(s))  CBC WITH DIFFERENTIAL     Status: Abnormal   Collection Time    11/13/13  8:30 AM      Result Value Ref Range   WBC 16.5 (*) 4.0 - 10.5 K/uL   RBC 4.33  4.22 - 5.81 MIL/uL   Hemoglobin 13.2  13.0 - 17.0 g/dL   HCT 38.3 (*) 39.0 - 52.0 %   MCV 88.5  78.0 - 100.0 fL   MCH 30.5  26.0 - 34.0 pg   MCHC 34.5  30.0 - 36.0 g/dL   RDW 14.6  11.5 - 15.5 %   Platelets 251  150 - 400 K/uL   Neutrophils Relative % 43  43 - 77 %   Lymphocytes Relative 26  12 - 46 %   Monocytes Relative 5  3 - 12 %   Eosinophils Relative 26 (*) 0 - 5 %   Basophils Relative 0  0 - 1 %   Neutro Abs 7.1  1.7 - 7.7 K/uL   Lymphs Abs 4.3 (*) 0.7 - 4.0 K/uL   Monocytes Absolute 0.8  0.1 - 1.0 K/uL   Eosinophils Absolute 4.3 (*) 0.0 - 0.7 K/uL   Basophils Absolute 0.0  0.0 - 0.1 K/uL  COMPREHENSIVE METABOLIC PANEL     Status: Abnormal   Collection Time    11/13/13  8:30 AM      Result Value Ref Range   Sodium 139  137 - 147 mEq/L   Potassium 3.9  3.7 - 5.3 mEq/L   Chloride 102  96 - 112 mEq/L   CO2 23  19 - 32 mEq/L    Glucose, Bld 142 (*) 70 - 99 mg/dL   BUN 17  6 - 23 mg/dL   Creatinine, Ser 1.24  0.50 - 1.35 mg/dL   Calcium 9.1  8.4 - 10.5 mg/dL   Total Protein 7.2  6.0 - 8.3 g/dL   Albumin 3.5  3.5 - 5.2 g/dL   AST 15  0 - 37 U/L   ALT 15  0 - 53 U/L   Alkaline Phosphatase 101  39 - 117 U/L   Total Bilirubin 0.3  0.3 - 1.2 mg/dL   GFR calc non Af Amer 66 (*) >90 mL/min   GFR calc Af Amer 77 (*) >90  mL/min   Anion gap 14  5 - 15  LIPASE, BLOOD     Status: None   Collection Time    11/13/13  8:30 AM      Result Value Ref Range   Lipase 54  11 - 59 U/L  POC OCCULT BLOOD, ED     Status: None   Collection Time    11/13/13  8:46 AM      Result Value Ref Range   Fecal Occult Bld NEGATIVE  NEGATIVE  URINALYSIS, ROUTINE W REFLEX MICROSCOPIC     Status: Abnormal   Collection Time    11/13/13 10:09 AM      Result Value Ref Range   Color, Urine YELLOW  YELLOW   APPearance CLEAR  CLEAR   Specific Gravity, Urine 1.010  1.005 - 1.030   pH 5.0  5.0 - 8.0   Glucose, UA NEGATIVE  NEGATIVE mg/dL   Hgb urine dipstick TRACE (*) NEGATIVE   Bilirubin Urine NEGATIVE  NEGATIVE   Ketones, ur NEGATIVE  NEGATIVE mg/dL   Protein, ur NEGATIVE  NEGATIVE mg/dL   Urobilinogen, UA 0.2  0.0 - 1.0 mg/dL   Nitrite NEGATIVE  NEGATIVE   Leukocytes, UA NEGATIVE  NEGATIVE  URINE MICROSCOPIC-ADD ON     Status: None   Collection Time    11/13/13 10:09 AM      Result Value Ref Range   RBC / HPF 0-2  <3 RBC/hpf    CT scanner not available today at [facility].   Dr. Stevie Kern in to examine the patient. Initially the patient wanted to drive himself to [facility] for CT scan, however, the pain has increased and he is requesting pain management. Dr. Stevie Kern discussed need for transport via [facility] and patient is agreeable.   MDM  48 y.o. male with abdominal pain x 5 days that has gotten progressively worse. Stable for transfer to [name] ED for CT scan. Dr. Stevie Kern spoke with the patient and discussed plan of care. Patient agrees with plan.      [place], NP 11/13/13 1048

## 2013-11-13 NOTE — ED Provider Notes (Signed)
CSN: [id number]     Arrival date & time 11/13/13  0802 History   First MD Initiated Contact with Patient 11/13/13 [phone]     Chief Complaint  Patient presents with  . Abdominal Pain     (Consider location/radiation/quality/duration/timing/severity/associated sxs/prior Treatment) HPI Comments: Patient presents today with LLQ abdominal pain that has been present for the past 5 days.  Pain has been constant and does not radiate.  Patient was evaluated at [facility] just prior to arrival, but was sent to the ED at [facility] to obtain a CT of the abdomen and pelvis.  CT scanner was broken at [facility].  Patient reports that the pain is improved at this time, but is currently a 6/10.  He was given IV Dilaudid at [facility], which he reports helped his pain.  Nausea is controlled at this time.  He reports that he has had associated nausea, vomiting, and diarrhea.  He also reports a subjective fever.  No blood in his emesis or blood in his stool.  Denies urinary symptoms.  Denies any known history of Diverticulitis or Diverticulosis.    Patient is a 48 y.o. male presenting with abdominal pain. The history is provided by the patient.  Abdominal Pain Associated symptoms: diarrhea, nausea and vomiting     Past Medical History  Diagnosis Date  . Hypertension    Past Surgical History  Procedure Laterality Date  . Cervical spine surgery  2009  . Colonoscopy    . Evaluation under anesthesia with hemorrhoidectomy  12/20/2011    ++++++++++++  . +++++++++++++++++++++++++++++++++++++++++++++++++++++     Family History  Problem Relation Age of Onset  . Hypertension Mother   . Hypertension Father   . Alcohol abuse Brother   . Drug abuse Brother    History  Substance Use Topics  . Smoking status: Current Every Day Smoker -- 1.00 packs/day for 20 years    Types: Cigarettes  . Smokeless tobacco: Never Used  . Alcohol Use: Yes     Comment: occasional beer, liquor and wine    Review of  Systems  Gastrointestinal: Positive for nausea, vomiting, abdominal pain and diarrhea.  All other systems reviewed and are negative.     Allergies  Review of patient's allergies indicates no known allergies.  Home Medications   Prior to Admission medications   Medication Sig Start Date End Date Taking? Authorizing Provider  amLODipine (NORVASC) 5 MG tablet Take 5 mg by mouth daily as needed (for high blood pressure).  01/27/13  Yes Historical Provider, MD  lisinopril-hydrochlorothiazide (PRINZIDE,ZESTORETIC) 20-25 MG per tablet Take 1 tablet by mouth daily. 03/03/13 03/03/14 Yes Historical Provider, MD  oxyCODONE-acetaminophen (PERCOCET) 10-325 MG per tablet Take 1 tablet by mouth every 4 (four) hours as needed for pain.   Yes Historical Provider, MD  tetrahydrozoline 0.05 % ophthalmic solution Place 2 drops into both eyes daily as needed (dry eyes).   Yes Historical Provider, MD   BP 110/79  Pulse 78  Temp(Src) 98 F (36.7 C) (Oral)  Resp 16  Ht 5\' 8"  (1.727 m)  Wt 170 lb (77.111 kg)  BMI 25.85 kg/m2  SpO2 100% Physical Exam  Constitutional: He appears well-developed and well-nourished.  HENT:  Head: Normocephalic and atraumatic.  Mouth/Throat: Oropharynx is clear and moist.  Neck: Normal range of motion. Neck supple.  Cardiovascular: Normal rate, regular rhythm and normal heart sounds.   Pulmonary/Chest: Effort normal and breath sounds normal.  Abdominal: Soft. Bowel sounds are normal. He exhibits  no distension and no mass. There is tenderness in the left upper quadrant and left lower quadrant. There is no rebound and no guarding.  Musculoskeletal: Normal range of motion.  Neurological: He is alert.  Skin: Skin is warm and dry.  Psychiatric: He has a normal mood and affect.  Nursing note and vitals reviewed.   ED Course  Procedures (including critical care time) Labs Review Labs Reviewed  CBC WITH DIFFERENTIAL - Abnormal; Notable for the following:    WBC 16.5 (*)     HCT 38.3 (*)    Eosinophils Relative 26 (*)    Lymphs Abs 4.3 (*)    Eosinophils Absolute 4.3 (*)    All other components within normal limits  COMPREHENSIVE METABOLIC PANEL - Abnormal; Notable for the following:    Glucose, Bld 142 (*)    GFR calc non Af Amer 66 (*)    GFR calc Af Amer 77 (*)    All other components within normal limits  URINALYSIS, ROUTINE W REFLEX MICROSCOPIC - Abnormal; Notable for the following:    Hgb urine dipstick TRACE (*)    All other components within normal limits  LIPASE, BLOOD  URINE MICROSCOPIC-ADD ON  OCCULT BLOOD X 1 CARD TO LAB, STOOL  POC OCCULT BLOOD, ED    Imaging Review Ct Abdomen Pelvis W Contrast  11/13/2013   CLINICAL DATA:  Left upper abdominal pain.  EXAM: CT ABDOMEN AND PELVIS WITH CONTRAST  TECHNIQUE: Multidetector CT imaging of the abdomen and pelvis was performed using the standard protocol following bolus administration of intravenous contrast.  CONTRAST:  153mL OMNIPAQUE IOHEXOL 300 MG/ML  SOLN  COMPARISON:  05/26/2013, 01/21/2013 contrast enhanced CT studies and several older unenhanced CT studies of the abdomen and pelvis.  FINDINGS: The liver, gallbladder, pancreas, spleen and adrenal glands are within normal limits. The kidneys show no evidence of obstruction or calculi. Stable cluster of cortical cysts anteriorly in the mid left kidney.  No evidence of bowel obstruction or inflammation. No free air, free fluid or focal abscess is identified. No evidence of mass or enlarged lymph nodes. Atherosclerotic plaque present in the distal aorta and iliac arteries without evidence of aneurysmal disease or significant arterial occlusion.  The bladder appears unremarkable. No hernias are identified. Bony structures are stable and show degenerative disc disease at the L5-S1 level with disc space narrowing and vacuum disc present.  IMPRESSION: No acute findings in the abdomen or pelvis.   Electronically Signed   By: Aletta Edouard M.D.   On: 11/13/2013  14:22     EKG Interpretation None      MDM   Final diagnoses:  Abdominal pain, acute, left lower quadrant   Patient presenting from [facility] to obtain a CT Abdomen/Pelvis due to LLQ abdominal pain.  Labs showing leukocytosis, but otherwise unremarkable.  CT ab/pelvis is negative.  Pain and nausea are controlled.  Patient tolerating PO liquids.  Feel that the patient is stable for discharge.  Patient given referral to GI if pain continues.  Patient discharged home with Zofran and pain medication.  Return precautions given.      Hyman Bible, PA-C 11/14/13 2152  Mariea Clonts, MD 11/19/13 [phone]

## 2013-11-16 ENCOUNTER — Encounter: Payer: Self-pay | Admitting: Physician Assistant

## 2013-11-16 ENCOUNTER — Ambulatory Visit (INDEPENDENT_AMBULATORY_CARE_PROVIDER_SITE_OTHER): Payer: BC Managed Care – PPO | Admitting: Physician Assistant

## 2013-11-16 ENCOUNTER — Other Ambulatory Visit (INDEPENDENT_AMBULATORY_CARE_PROVIDER_SITE_OTHER): Payer: BC Managed Care – PPO

## 2013-11-16 VITALS — BP 90/64 | HR 100 | Wt 171.4 lb

## 2013-11-16 DIAGNOSIS — R1030 Lower abdominal pain, unspecified: Secondary | ICD-10-CM

## 2013-11-16 DIAGNOSIS — K529 Noninfective gastroenteritis and colitis, unspecified: Secondary | ICD-10-CM

## 2013-11-16 LAB — CBC WITH DIFFERENTIAL/PLATELET
BASOS ABS: 0.1 10*3/uL (ref 0.0–0.1)
Basophils Relative: 0.4 % (ref 0.0–3.0)
Eosinophils Absolute: 2.5 10*3/uL — ABNORMAL HIGH (ref 0.0–0.7)
Eosinophils Relative: 15.6 % — ABNORMAL HIGH (ref 0.0–5.0)
HEMATOCRIT: 39.1 % (ref 39.0–52.0)
Hemoglobin: 13.2 g/dL (ref 13.0–17.0)
LYMPHS ABS: 3.7 10*3/uL (ref 0.7–4.0)
Lymphocytes Relative: 23.1 % (ref 12.0–46.0)
MCHC: 33.8 g/dL (ref 30.0–36.0)
MCV: 90.3 fl (ref 78.0–100.0)
MONO ABS: 0.9 10*3/uL (ref 0.1–1.0)
Monocytes Relative: 5.4 % (ref 3.0–12.0)
Neutro Abs: 8.9 10*3/uL — ABNORMAL HIGH (ref 1.4–7.7)
Neutrophils Relative %: 55.5 % (ref 43.0–77.0)
Platelets: 234 10*3/uL (ref 150.0–400.0)
RBC: 4.34 Mil/uL (ref 4.22–5.81)
RDW: 15.1 % (ref 11.5–15.5)
WBC: 16 10*3/uL — ABNORMAL HIGH (ref 4.0–10.5)

## 2013-11-16 MED ORDER — OXYCODONE-ACETAMINOPHEN 7.5-325 MG PO TABS: 1.0000 | ORAL_TABLET | Freq: Four times a day (QID) | ORAL | Status: DC | PRN

## 2013-11-16 NOTE — Progress Notes (Signed)
i agree with the above note, plan 

## 2013-11-16 NOTE — Progress Notes (Signed)
Subjective:    Patient ID: Benjamin Solomon, male    DOB: Aug 08, 1965, 48 y.o.   MRN: [id number]  HPI  Benjamin Solomon is a 48 year old African-American male new to GI today. He would like to establish with Dr. Ardis Hughs. Patient had been seen by Dr. Benson Norway in July 2013 for colonoscopy as found have large hemorrhoids and multiple small polyps one was a tubular adenoma the remainder were hyperplastic polyps. He underwent a hemorrhoidectomy in December 2013. Patient's records show history of EtOH and cocaine abuse, hypertension, and anxiety. Patient comes in today after an ER visit on the10/31 . He said he had onset of symptoms about one week ago with diarrhea with 3-4 loose bowel movements per day which were nonbloody. This was associated with abdominal pain and cramping in his lower abdomen. Nausea. vomiting and fever. His had no known infectious exposures.. He had not been on any new medications or antibiotics. His symptoms lasted for 3-4 days and are now resolving. He says he has some residual abdominal pain in his lower abdomen. He describes this as achy and crampy in nature. Evaluation through the emergency room showed a WBC elevated at 16.5 remainder of his labs were unremarkable. He had a CT scan of the abdomen and pelvis done which was also negative.    Review of Systems  Constitutional: Negative.   HENT: Negative.   Eyes: Negative.   Respiratory: Negative.   Cardiovascular: Negative.   Gastrointestinal: Positive for nausea, abdominal pain and diarrhea.  Endocrine: Negative.   Genitourinary: Negative.   Musculoskeletal: Negative.   Skin: Negative.   Allergic/Immunologic: Negative.   Neurological: Negative.   Hematological: Negative.   Psychiatric/Behavioral: Negative.    Outpatient Prescriptions Prior to Visit  Medication Sig Dispense Refill  . amLODipine (NORVASC) 5 MG tablet Take 5 mg by mouth daily as needed (for high blood pressure).     Marland Kitchen HYDROcodone-acetaminophen (NORCO/VICODIN) 5-325 MG  per tablet Take 1-2 tablets by mouth every 6 (six) hours as needed. 15 tablet 0  . lisinopril-hydrochlorothiazide (PRINZIDE,ZESTORETIC) 20-25 MG per tablet Take 1 tablet by mouth daily.    . ondansetron (ZOFRAN) 4 MG tablet Take 1 tablet (4 mg total) by mouth every 6 (six) hours. 12 tablet 0  . oxyCODONE-acetaminophen (PERCOCET) 10-325 MG per tablet Take 1 tablet by mouth every 4 (four) hours as needed for pain.    Marland Kitchen tetrahydrozoline 0.05 % ophthalmic solution Place 2 drops into both eyes daily as needed (dry eyes).     No facility-administered medications prior to visit.   No Known Allergies Patient Active Problem List   Diagnosis Date Noted  . Generalized anxiety disorder 10/04/2012  . Alcohol dependence 06/26/2012  . Cocaine dependence 06/26/2012  . Depressive disorder, not elsewhere classified 06/26/2012  . Bleeding external hemorrhoids 12/17/2011   History  Substance Use Topics  . Smoking status: Current Every Day Smoker -- 1.00 packs/day for 20 years    Types: Cigarettes  . Smokeless tobacco: Never Used     Comment: form given 11/16/13  . Alcohol Use: No   family history includes Alcohol abuse in his brother; Drug abuse in his brother; Hypertension in his father and mother. There is no history of Diabetes, Heart disease, Kidney disease, or Liver disease.     Objective:   Physical Exam  Well-developed African-American male in no acute distress, blood pressure 90/64 pulse 100 weight 171. HEENT ;nontraumatic normocephalic EOMI PERRLA sclera anicteric, Neck; supple no JVD, Cardiovascular; regular rate and rhythm with S1-S2  no murmur rub or gallop, Pulmonary; clear bilaterally, Abdomen ;soft basically nontender no palpable mass or hepatosplenomegaly bowel sounds are present, Rectal; exam not done, Extremities; no clubbing cyanosis or edema skin warm and dry, Psych; mood and affect normal and appropriate        Assessment & Plan:  #50  48 year old African-American male with acute  diarrheal illness associated with lower abdominal cramping nausea vomiting and fever. This most likely was a viral gastroenteritis, cannot rule out foodborne illness. His symptoms are resolving but has some lingering abdominal pain  #2 status post external hemorrhoidectomy December 2013 #3 history of adenomatous and hyperplastic colon polyps last colonoscopy July 2013 #4 hypertension #5 history of substance abuse  Plan;  I do not think he needs any further GI workup at this time as symptoms are all resolving. He requests some pain medication and will give him a limited amount of Percocet with no refills. Repeat CBC today He will follow-up with Dr. Ardis Hughs as needed We will place recall for colonoscopy July 2018

## 2013-11-16 NOTE — Patient Instructions (Signed)
We have given you a prescription for pain to take to your pharmacy, Percocet 7/325 mg.   Follow up with Dr. Owens Loffler as needed. We will put in our system the next due date for a colonoscopy, July of 2018. You will receive a letter one month prior to that date.

## 2014-03-21 ENCOUNTER — Encounter (HOSPITAL_COMMUNITY): Payer: Self-pay | Admitting: Emergency Medicine

## 2014-03-21 ENCOUNTER — Emergency Department (HOSPITAL_COMMUNITY)
Admission: EM | Admit: 2014-03-21 | Discharge: 2014-03-21 | Disposition: A | Discharge status: Home / Self Care | Payer: BLUE CROSS/BLUE SHIELD | Attending: Emergency Medicine | Admitting: Emergency Medicine

## 2014-03-21 DIAGNOSIS — Z79899 Other long term (current) drug therapy: Secondary | ICD-10-CM | POA: Insufficient documentation

## 2014-03-21 DIAGNOSIS — Z86018 Personal history of other benign neoplasm: Secondary | ICD-10-CM | POA: Diagnosis not present

## 2014-03-21 DIAGNOSIS — Z720 Tobacco use: Secondary | ICD-10-CM | POA: Diagnosis not present

## 2014-03-21 DIAGNOSIS — IMO0002 Reserved for concepts with insufficient information to code with codable children: Secondary | ICD-10-CM

## 2014-03-21 DIAGNOSIS — Z8719 Personal history of other diseases of the digestive system: Secondary | ICD-10-CM | POA: Diagnosis not present

## 2014-03-21 DIAGNOSIS — N508 Other specified disorders of male genital organs: Secondary | ICD-10-CM | POA: Insufficient documentation

## 2014-03-21 DIAGNOSIS — I10 Essential (primary) hypertension: Secondary | ICD-10-CM | POA: Diagnosis not present

## 2014-03-21 LAB — I-STAT CHEM 8, ED
BUN: 9 mg/dL (ref 6–23)
CALCIUM ION: 1.17 mmol/L (ref 1.12–1.23)
Chloride: 100 mmol/L (ref 96–112)
Creatinine, Ser: 1 mg/dL (ref 0.50–1.35)
GLUCOSE: 116 mg/dL — AB (ref 70–99)
HEMATOCRIT: 41 % (ref 39.0–52.0)
HEMOGLOBIN: 13.9 g/dL (ref 13.0–17.0)
Potassium: 3.9 mmol/L (ref 3.5–5.1)
Sodium: 138 mmol/L (ref 135–145)
TCO2: 21 mmol/L (ref 0–100)

## 2014-03-21 LAB — URINALYSIS, ROUTINE W REFLEX MICROSCOPIC
Bilirubin Urine: NEGATIVE
Glucose, UA: NEGATIVE mg/dL
Ketones, ur: NEGATIVE mg/dL
LEUKOCYTES UA: NEGATIVE
Nitrite: NEGATIVE
PH: 5.5 (ref 5.0–8.0)
Protein, ur: NEGATIVE mg/dL
Specific Gravity, Urine: 1.025 (ref 1.005–1.030)
Urobilinogen, UA: 0.2 mg/dL (ref 0.0–1.0)

## 2014-03-21 LAB — URINE MICROSCOPIC-ADD ON

## 2014-03-21 MED ORDER — IBUPROFEN 600 MG PO TABS: 600.0000 mg | ORAL_TABLET | Freq: Four times a day (QID) | ORAL | Status: DC | PRN

## 2014-03-21 MED ORDER — HYDROMORPHONE HCL 1 MG/ML IJ SOLN
1.0000 mg | Freq: Once | INTRAMUSCULAR | Status: AC
  Administered 2014-03-21: 1 mg via INTRAMUSCULAR
  Filled 2014-03-21: qty 1

## 2014-03-21 MED ORDER — KETOROLAC TROMETHAMINE 60 MG/2ML IM SOLN
60.0000 mg | Freq: Once | INTRAMUSCULAR | Status: AC
  Administered 2014-03-21: 60 mg via INTRAMUSCULAR
  Filled 2014-03-21: qty 2

## 2014-03-21 NOTE — Discharge Instructions (Signed)
Your lab tests are negative today.  I suggest wearing more supportive underwear or even a jock strap to give better support, especially while at standing and walking at work.  Followup with your doctor if your symptoms persist or worsen.

## 2014-03-21 NOTE — ED Notes (Signed)
Testicular pain has been occuring for months. Pain worsened on Friday. Pt denies swelling, pain upon urination, or unusual discharge.

## 2014-03-21 NOTE — ED Provider Notes (Signed)
CSN: [id number]     Arrival date & time 03/21/14  0749 History   First MD Initiated Contact with Patient 03/21/14 [phone]     Chief Complaint  Patient presents with  . Testicle Pain     (Consider location/radiation/quality/duration/timing/severity/associated sxs/prior Treatment) The history is provided by the patient and the spouse.   Benjamin Solomon is a 49 y.o. male presenting with worsening of his chronic bilateral testicle pain since starting a new job several weeks which requires standing and lots of walking for 8 hour shifts.  He denies fevers, chills, scrotal swelling, dysuria, hematuria or urinary frequency.  Denies penile discharge and penile pain.  His pain is worsened with standing and when he lies on his side to sleep.  Resolves lying on his back and with sitting.  He has been seen by urologist Dr. Rosana Hoes at [facility] last summer who endorsed pain management, given negative [place] and Ct scans.  He is utd with his prostate ca screening through his pcp.  He was advised to switch from boxer to briefs for better support, and maybe helped some.  The briefs he presents with today are fairly large, offering little support.    Past Medical History  Diagnosis Date  . Hypertension   . Tubular adenoma   . Hyperplastic colon polyp   . Hemorrhoids    Past Surgical History  Procedure Laterality Date  . Cervical spine surgery  2009  . Colonoscopy    . Evaluation under anesthesia with hemorrhoidectomy  12/20/2011   Family History  Problem Relation Age of Onset  . Hypertension Mother   . Hypertension Father   . Alcohol abuse Brother   . Drug abuse Brother   . Diabetes Neg Hx   . Heart disease Neg Hx   . Kidney disease Neg Hx   . Liver disease Neg Hx    History  Substance Use Topics  . Smoking status: Current Every Day Smoker -- 1.00 packs/day for 20 years    Types: Cigarettes  . Smokeless tobacco: Never Used     Comment: form given 11/16/13  . Alcohol Use: No    Review of  Systems  Constitutional: Negative for fever.  HENT: Negative for congestion and sore throat.   Eyes: Negative.   Respiratory: Negative for chest tightness and shortness of breath.   Cardiovascular: Negative for chest pain.  Gastrointestinal: Negative for nausea and abdominal pain.  Genitourinary: Positive for testicular pain. Negative for dysuria, urgency, discharge, penile swelling, scrotal swelling and penile pain.  Musculoskeletal: Negative for joint swelling, arthralgias and neck pain.  Skin: Negative.  Negative for rash and wound.  Neurological: Negative for dizziness, weakness, light-headedness, numbness and headaches.  Psychiatric/Behavioral: Negative.       Allergies  Review of patient's allergies indicates no known allergies.  Home Medications   Prior to Admission medications   Medication Sig Start Date End Date Taking? Authorizing Provider  amLODipine (NORVASC) 5 MG tablet Take 5 mg by mouth daily as needed (for high blood pressure).  01/27/13  Yes Historical Provider, MD  lisinopril-hydrochlorothiazide (PRINZIDE,ZESTORETIC) 20-12.5 MG per tablet Take 1 tablet by mouth daily.   Yes Historical Provider, MD  oxyCODONE-acetaminophen (PERCOCET) 10-325 MG per tablet Take 1 tablet by mouth every 4 (four) hours as needed for pain.   Yes Historical Provider, MD  tetrahydrozoline 0.05 % ophthalmic solution Place 2 drops into both eyes daily as needed (dry eyes).   Yes Historical Provider, MD  HYDROcodone-acetaminophen (NORCO/VICODIN) 5-325 MG per  tablet Take 1-2 tablets by mouth every 6 (six) hours as needed. Patient not taking: Reported on 03/21/2014 11/13/13   Hyman Bible, PA-C  ibuprofen (ADVIL,MOTRIN) 600 MG tablet Take 1 tablet (600 mg total) by mouth every 6 (six) hours as needed. 03/21/14   Evalee Jefferson, PA-C  ondansetron (ZOFRAN) 4 MG tablet Take 1 tablet (4 mg total) by mouth every 6 (six) hours. Patient not taking: Reported on 03/21/2014 11/13/13   Hyman Bible, PA-C   oxyCODONE-acetaminophen (PERCOCET) 7.5-325 MG per tablet Take 1 tablet by mouth every 6 (six) hours as needed for pain. Patient not taking: Reported on 03/21/2014 11/16/13   Amy S Esterwood, PA-C   BP 135/96 mmHg  Pulse 94  Temp(Src) 98.2 F (36.8 C) (Oral)  Ht 5\' 8"  (1.727 m)  Wt 170 lb (77.111 kg)  BMI 25.85 kg/m2  SpO2 100% Physical Exam  Constitutional: He appears well-developed and well-nourished.  HENT:  Head: Normocephalic and atraumatic.  Eyes: Conjunctivae are normal.  Cardiovascular: Normal rate, regular rhythm, normal heart sounds and intact distal pulses.   Pulmonary/Chest: Effort normal and breath sounds normal. He has no wheezes.  Abdominal: Soft. Bowel sounds are normal. There is no tenderness. Hernia confirmed negative in the right inguinal area and confirmed negative in the left inguinal area.  Genitourinary: Right testis shows no mass, no swelling and no tenderness. Left testis shows no mass, no swelling and no tenderness. Circumcised.  Pt endorses swelling in inguinal regions bilaterally, no significant swelling appreciated on exam.  No adenopathy. No swelling of scotum, nontender, no inguinal herniations appreciated.    Musculoskeletal: Normal range of motion.  Lymphadenopathy:       Right: No inguinal adenopathy present.       Left: No inguinal adenopathy present.  Neurological: He is alert.  Skin: Skin is warm and dry.  Psychiatric: He has a normal mood and affect.  Nursing note and vitals reviewed.   ED Course  Procedures (including critical care time) Labs Review Labs Reviewed  URINALYSIS, ROUTINE W REFLEX MICROSCOPIC - Abnormal; Notable for the following:    Hgb urine dipstick SMALL (*)    All other components within normal limits  URINE MICROSCOPIC-ADD ON - Abnormal; Notable for the following:    Squamous Epithelial / LPF FEW (*)    Casts HYALINE CASTS (*)    All other components within normal limits  I-STAT CHEM 8, ED - Abnormal; Notable for the  following:    Glucose, Bld 116 (*)    All other components within normal limits    Imaging Review No results found.   EKG Interpretation None      MDM   Final diagnoses:  Testicular/scrotal pain    Patients labs and/or radiological studies were reviewed and considered during the medical decision making and disposition process.  Results were also discussed with patient.  Pt's previous visits for same sx, including [facility] eval by urology reviewed.  Neg Ct and [place] in the past, also reviewed.  Advised pt to use more supportive underwear or jock strap when at work since pain seems worsened with standing and walking with new job.  Ibuprofen added to his already prescribed oxycodone through pain management.  Prn f/u advised if sx persist or worsen.    Evalee Jefferson, PA-C 03/21/14 3016  Davonna Belling, MD 03/21/14 [phone]

## 2016-04-19 ENCOUNTER — Other Ambulatory Visit: Payer: Self-pay | Admitting: Pain Medicine

## 2016-04-19 DIAGNOSIS — M545 Low back pain, unspecified: Secondary | ICD-10-CM

## 2016-05-02 ENCOUNTER — Ambulatory Visit
Admission: RE | Admit: 2016-05-02 | Discharge: 2016-05-02 | Disposition: A | Discharge status: Home / Self Care | Payer: BLUE CROSS/BLUE SHIELD | Source: Ambulatory Visit | Attending: Pain Medicine | Admitting: Pain Medicine

## 2016-05-02 DIAGNOSIS — M545 Low back pain, unspecified: Secondary | ICD-10-CM

## 2016-05-09 ENCOUNTER — Ambulatory Visit (INDEPENDENT_AMBULATORY_CARE_PROVIDER_SITE_OTHER): Payer: BLUE CROSS/BLUE SHIELD | Admitting: Psychology

## 2016-05-09 DIAGNOSIS — F331 Major depressive disorder, recurrent, moderate: Secondary | ICD-10-CM | POA: Diagnosis not present

## 2016-05-23 ENCOUNTER — Ambulatory Visit (HOSPITAL_COMMUNITY): Payer: BLUE CROSS/BLUE SHIELD | Admitting: Physical Therapy

## 2016-05-24 ENCOUNTER — Ambulatory Visit (INDEPENDENT_AMBULATORY_CARE_PROVIDER_SITE_OTHER): Payer: BLUE CROSS/BLUE SHIELD | Admitting: Psychology

## 2016-05-24 DIAGNOSIS — F331 Major depressive disorder, recurrent, moderate: Secondary | ICD-10-CM | POA: Diagnosis not present

## 2016-05-30 ENCOUNTER — Ambulatory Visit (INDEPENDENT_AMBULATORY_CARE_PROVIDER_SITE_OTHER): Payer: BLUE CROSS/BLUE SHIELD | Admitting: Psychology

## 2016-05-30 DIAGNOSIS — F331 Major depressive disorder, recurrent, moderate: Secondary | ICD-10-CM

## 2016-06-07 ENCOUNTER — Encounter: Payer: Self-pay | Admitting: Gastroenterology

## 2016-06-17 ENCOUNTER — Ambulatory Visit (INDEPENDENT_AMBULATORY_CARE_PROVIDER_SITE_OTHER): Payer: BLUE CROSS/BLUE SHIELD | Admitting: Psychology

## 2016-06-17 DIAGNOSIS — F331 Major depressive disorder, recurrent, moderate: Secondary | ICD-10-CM

## 2016-06-19 ENCOUNTER — Other Ambulatory Visit: Payer: Self-pay | Admitting: Neurological Surgery

## 2016-06-19 DIAGNOSIS — M5137 Other intervertebral disc degeneration, lumbosacral region: Secondary | ICD-10-CM

## 2016-06-25 ENCOUNTER — Ambulatory Visit
Admission: RE | Admit: 2016-06-25 | Discharge: 2016-06-25 | Disposition: A | Discharge status: Home / Self Care | Payer: BLUE CROSS/BLUE SHIELD | Source: Ambulatory Visit | Attending: Neurological Surgery | Admitting: Neurological Surgery

## 2016-06-25 DIAGNOSIS — M5137 Other intervertebral disc degeneration, lumbosacral region: Secondary | ICD-10-CM

## 2016-06-28 ENCOUNTER — Other Ambulatory Visit: Payer: Self-pay | Admitting: Neurological Surgery

## 2016-07-01 ENCOUNTER — Ambulatory Visit: Payer: BLUE CROSS/BLUE SHIELD | Admitting: Psychology

## 2016-07-15 ENCOUNTER — Ambulatory Visit (INDEPENDENT_AMBULATORY_CARE_PROVIDER_SITE_OTHER): Payer: BLUE CROSS/BLUE SHIELD | Admitting: Psychology

## 2016-07-15 DIAGNOSIS — F331 Major depressive disorder, recurrent, moderate: Secondary | ICD-10-CM

## 2016-07-29 ENCOUNTER — Encounter (HOSPITAL_COMMUNITY): Payer: Self-pay

## 2016-07-29 NOTE — Pre-Procedure Instructions (Signed)
Benjamin Solomon  07/29/2016      [id number] - [place], [place] - [address] [address] Phone: [phone] Fax: [phone]    Your procedure is scheduled on Wed. July 25  Report to [facility] Admitting at 5:30 A.M.  Call this number if you have problems the morning of surgery:  [phone]   Remember:  Do not eat food or drink liquids after midnight on Tues. July 24  Take these medicines the morning of surgery with A SIP OF WATER : amlodipine (norvasc), baclofen (lioresal), wellbutrin, oxycodone if needed, oxycontin            1 week prior to surgery stop Advil, aspirin, motrin, aleve,ibuprofen, BC Powders, Goody's, vitamins and herbal medicines.   Do not wear jewelry, make-up or nail polish.  Do not wear lotions, powders, or perfumes, or deoderant.  Do not shave 48 hours prior to surgery.  Men may shave face and neck.  Do not bring valuables to the hospital.  [facility] is not responsible for any belongings or valuables.  Contacts, dentures or bridgework may not be worn into surgery.  Leave your suitcase in the car.  After surgery it may be brought to your room.  For patients admitted to the hospital, discharge time will be determined by your treatment team.  Patients discharged the day of surgery will not be allowed to drive home.    Special instructions:   [facility]- Preparing For Surgery  Before surgery, you can play an important role. Because skin is not sterile, your skin needs to be as free of germs as possible. You can reduce the number of germs on your skin by washing with CHG (chlorahexidine gluconate) Soap before surgery.  CHG is an antiseptic cleaner which kills germs and bonds with the skin to continue killing germs even after washing.  Please do not use if you have an allergy to CHG or antibacterial soaps. If your skin becomes reddened/irritated stop using the CHG.  Do not shave  (including legs and underarms) for at least 48 hours prior to first CHG shower. It is OK to shave your face.  Please follow these instructions carefully.   1. Shower the NIGHT BEFORE SURGERY and the MORNING OF SURGERY with CHG.   2. If you chose to wash your hair, wash your hair first as usual with your normal shampoo.  3. After you shampoo, rinse your hair and body thoroughly to remove the shampoo.  4. Use CHG as you would any other liquid soap. You can apply CHG directly to the skin and wash gently with a scrungie or a clean washcloth.   5. Apply the CHG Soap to your body ONLY FROM THE NECK DOWN.  Do not use on open wounds or open sores. Avoid contact with your eyes, ears, mouth and genitals (private parts). Wash genitals (private parts) with your normal soap.  6. Wash thoroughly, paying special attention to the area where your surgery will be performed.  7. Thoroughly rinse your body with warm water from the neck down.  8. DO NOT shower/wash with your normal soap after using and rinsing off the CHG Soap.  9. Pat yourself dry with a CLEAN TOWEL.   10. Wear CLEAN PAJAMAS   11. Place CLEAN SHEETS on your bed the night of your first shower and DO NOT SLEEP WITH PETS.    Day of Surgery: Do not apply any  deodorants/lotions. Please wear clean clothes to the hospital/surgery center.      Please read over the following fact sheets that you were given. Coughing and Deep Breathing, MRSA Information and Surgical Site Infection Prevention

## 2016-07-30 ENCOUNTER — Ambulatory Visit (HOSPITAL_COMMUNITY)
Admission: RE | Admit: 2016-07-30 | Discharge: 2016-07-30 | Disposition: A | Discharge status: Home / Self Care | Payer: BLUE CROSS/BLUE SHIELD | Source: Ambulatory Visit | Attending: Neurological Surgery | Admitting: Neurological Surgery

## 2016-07-30 ENCOUNTER — Encounter (HOSPITAL_COMMUNITY): Payer: Self-pay

## 2016-07-30 ENCOUNTER — Encounter (HOSPITAL_COMMUNITY)
Admission: RE | Admit: 2016-07-30 | Discharge: 2016-07-30 | Disposition: A | Discharge status: Home / Self Care | Payer: BLUE CROSS/BLUE SHIELD | Source: Ambulatory Visit | Attending: Neurological Surgery | Admitting: Neurological Surgery

## 2016-07-30 DIAGNOSIS — M5136 Other intervertebral disc degeneration, lumbar region: Secondary | ICD-10-CM | POA: Diagnosis not present

## 2016-07-30 DIAGNOSIS — Z01812 Encounter for preprocedural laboratory examination: Secondary | ICD-10-CM | POA: Diagnosis not present

## 2016-07-30 DIAGNOSIS — M48061 Spinal stenosis, lumbar region without neurogenic claudication: Secondary | ICD-10-CM | POA: Insufficient documentation

## 2016-07-30 DIAGNOSIS — Z01810 Encounter for preprocedural cardiovascular examination: Secondary | ICD-10-CM | POA: Diagnosis present

## 2016-07-30 DIAGNOSIS — Z01818 Encounter for other preprocedural examination: Secondary | ICD-10-CM | POA: Diagnosis not present

## 2016-07-30 HISTORY — DX: Anxiety disorder, unspecified: F41.9

## 2016-07-30 HISTORY — DX: Depression, unspecified: F32.A

## 2016-07-30 HISTORY — DX: Unspecified osteoarthritis, unspecified site: M19.90

## 2016-07-30 HISTORY — DX: Major depressive disorder, single episode, unspecified: F32.9

## 2016-07-30 LAB — ABO/RH: ABO/RH(D): O POS

## 2016-07-30 LAB — BASIC METABOLIC PANEL
Anion gap: 7 (ref 5–15)
BUN: 9 mg/dL (ref 6–20)
CALCIUM: 9.6 mg/dL (ref 8.9–10.3)
CHLORIDE: 102 mmol/L (ref 101–111)
CO2: 25 mmol/L (ref 22–32)
Creatinine, Ser: 1.29 mg/dL — ABNORMAL HIGH (ref 0.61–1.24)
GFR calc non Af Amer: >60 mL/min (ref >60)
GLUCOSE: 106 mg/dL — AB (ref 65–99)
POTASSIUM: 3.8 mmol/L (ref 3.5–5.1)
Sodium: 134 mmol/L — ABNORMAL LOW (ref 135–145)

## 2016-07-30 LAB — CBC WITH DIFFERENTIAL/PLATELET
BASOS PCT: 0 %
Basophils Absolute: 0 10*3/uL (ref 0.0–0.1)
Eosinophils Absolute: 0.3 10*3/uL (ref 0.0–0.7)
Eosinophils Relative: 3 %
HEMATOCRIT: 39.5 % (ref 39.0–52.0)
HEMOGLOBIN: 13.7 g/dL (ref 13.0–17.0)
LYMPHS ABS: 3.6 10*3/uL (ref 0.7–4.0)
LYMPHS PCT: 36 %
MCH: 30.7 pg (ref 26.0–34.0)
MCHC: 34.7 g/dL (ref 30.0–36.0)
MCV: 88.6 fL (ref 78.0–100.0)
MONO ABS: 0.6 10*3/uL (ref 0.1–1.0)
MONOS PCT: 7 %
NEUTROS PCT: 54 %
Neutro Abs: 5.3 10*3/uL (ref 1.7–7.7)
Platelets: 257 10*3/uL (ref 150–400)
RBC: 4.46 MIL/uL (ref 4.22–5.81)
RDW: 14.1 % (ref 11.5–15.5)
WBC: 9.8 10*3/uL (ref 4.0–10.5)

## 2016-07-30 LAB — SURGICAL PCR SCREEN
MRSA, PCR: NEGATIVE
Staphylococcus aureus: NEGATIVE

## 2016-07-30 LAB — PROTIME-INR
INR: 0.93
Prothrombin Time: 12.4 seconds (ref 11.4–15.2)

## 2016-07-30 LAB — TYPE AND SCREEN
ABO/RH(D): O POS
Antibody Screen: NEGATIVE

## 2016-07-30 NOTE — Progress Notes (Signed)
PCP: Dr. Jobe Igo @ [facility]

## 2016-08-05 ENCOUNTER — Ambulatory Visit: Payer: BLUE CROSS/BLUE SHIELD | Admitting: Psychology

## 2016-08-07 ENCOUNTER — Encounter (HOSPITAL_COMMUNITY): Payer: Self-pay

## 2016-08-07 ENCOUNTER — Inpatient Hospital Stay (HOSPITAL_COMMUNITY): Payer: BLUE CROSS/BLUE SHIELD | Admitting: Certified Registered"

## 2016-08-07 ENCOUNTER — Encounter (HOSPITAL_COMMUNITY)
Admission: RE | Disposition: A | Discharge status: Home / Self Care | Payer: Self-pay | Source: Ambulatory Visit | Attending: Neurological Surgery

## 2016-08-07 ENCOUNTER — Inpatient Hospital Stay (HOSPITAL_COMMUNITY)
Admission: RE | Admit: 2016-08-07 | Discharge: 2016-08-08 | DRG: 460 | Disposition: A | Discharge status: Home / Self Care | Payer: BLUE CROSS/BLUE SHIELD | Source: Ambulatory Visit | Attending: Neurological Surgery | Admitting: Neurological Surgery

## 2016-08-07 ENCOUNTER — Inpatient Hospital Stay (HOSPITAL_COMMUNITY): Payer: BLUE CROSS/BLUE SHIELD

## 2016-08-07 DIAGNOSIS — I10 Essential (primary) hypertension: Secondary | ICD-10-CM | POA: Diagnosis present

## 2016-08-07 DIAGNOSIS — Z8249 Family history of ischemic heart disease and other diseases of the circulatory system: Secondary | ICD-10-CM | POA: Diagnosis not present

## 2016-08-07 DIAGNOSIS — M48061 Spinal stenosis, lumbar region without neurogenic claudication: Secondary | ICD-10-CM | POA: Diagnosis present

## 2016-08-07 DIAGNOSIS — M5137 Other intervertebral disc degeneration, lumbosacral region: Principal | ICD-10-CM | POA: Diagnosis present

## 2016-08-07 DIAGNOSIS — Z811 Family history of alcohol abuse and dependence: Secondary | ICD-10-CM

## 2016-08-07 DIAGNOSIS — F17210 Nicotine dependence, cigarettes, uncomplicated: Secondary | ICD-10-CM | POA: Diagnosis present

## 2016-08-07 DIAGNOSIS — Z981 Arthrodesis status: Secondary | ICD-10-CM

## 2016-08-07 DIAGNOSIS — Z8719 Personal history of other diseases of the digestive system: Secondary | ICD-10-CM

## 2016-08-07 DIAGNOSIS — Z419 Encounter for procedure for purposes other than remedying health state, unspecified: Secondary | ICD-10-CM

## 2016-08-07 DIAGNOSIS — Z79891 Long term (current) use of opiate analgesic: Secondary | ICD-10-CM | POA: Diagnosis not present

## 2016-08-07 SURGERY — POSTERIOR LUMBAR FUSION 1 LEVEL
Anesthesia: General | Site: Back

## 2016-08-07 MED ORDER — VANCOMYCIN HCL 1000 MG IV SOLR
INTRAVENOUS | Status: AC
  Filled 2016-08-07: qty 1000

## 2016-08-07 MED ORDER — OXYCODONE HCL 5 MG PO TABS
5.0000 mg | ORAL_TABLET | Freq: Once | ORAL | Status: AC | PRN
  Administered 2016-08-07: 5 mg via ORAL

## 2016-08-07 MED ORDER — THROMBIN 5000 UNITS EX SOLR
OROMUCOSAL | Status: DC | PRN
  Administered 2016-08-07: 09:00:00 via TOPICAL

## 2016-08-07 MED ORDER — ONDANSETRON HCL 4 MG/2ML IJ SOLN: 4.0000 mg | Freq: Four times a day (QID) | INTRAMUSCULAR | Status: DC | PRN

## 2016-08-07 MED ORDER — OXYCODONE HCL 5 MG PO TABS
ORAL_TABLET | ORAL | Status: AC
  Filled 2016-08-07: qty 1

## 2016-08-07 MED ORDER — CHLORHEXIDINE GLUCONATE CLOTH 2 % EX PADS: 6.0000 | MEDICATED_PAD | Freq: Once | CUTANEOUS | Status: DC

## 2016-08-07 MED ORDER — METHOCARBAMOL 500 MG PO TABS
500.0000 mg | ORAL_TABLET | Freq: Four times a day (QID) | ORAL | Status: DC | PRN
  Administered 2016-08-07 – 2016-08-08 (×3): 500 mg via ORAL
  Filled 2016-08-07 (×3): qty 1

## 2016-08-07 MED ORDER — BACLOFEN 10 MG PO TABS
10.0000 mg | ORAL_TABLET | Freq: Three times a day (TID) | ORAL | Status: DC
  Administered 2016-08-07 – 2016-08-08 (×4): 10 mg via ORAL
  Filled 2016-08-07 (×5): qty 1

## 2016-08-07 MED ORDER — OXYCODONE HCL 5 MG/5ML PO SOLN: 5.0000 mg | Freq: Once | ORAL | Status: AC | PRN

## 2016-08-07 MED ORDER — HYDROMORPHONE HCL 1 MG/ML IJ SOLN
0.5000 mg | INTRAMUSCULAR | Status: DC | PRN
  Administered 2016-08-07: 0.5 mg via INTRAVENOUS

## 2016-08-07 MED ORDER — MENTHOL 3 MG MT LOZG: 1.0000 | LOZENGE | OROMUCOSAL | Status: DC | PRN

## 2016-08-07 MED ORDER — THROMBIN 5000 UNITS EX SOLR
CUTANEOUS | Status: AC
  Filled 2016-08-07: qty 5000

## 2016-08-07 MED ORDER — CEFAZOLIN SODIUM-DEXTROSE 2-4 GM/100ML-% IV SOLN
2.0000 g | INTRAVENOUS | Status: AC
  Administered 2016-08-07: 2 g via INTRAVENOUS

## 2016-08-07 MED ORDER — SODIUM CHLORIDE 0.9 % IR SOLN
Status: DC | PRN
  Administered 2016-08-07: 09:00:00

## 2016-08-07 MED ORDER — NICOTINE 21 MG/24HR TD PT24
21.0000 mg | MEDICATED_PATCH | Freq: Every day | TRANSDERMAL | Status: DC
  Administered 2016-08-07: 21 mg via TRANSDERMAL
  Filled 2016-08-07: qty 1

## 2016-08-07 MED ORDER — LIDOCAINE HCL (CARDIAC) 20 MG/ML IV SOLN
INTRAVENOUS | Status: DC | PRN
  Administered 2016-08-07: 60 mg via INTRAVENOUS

## 2016-08-07 MED ORDER — SODIUM CHLORIDE 0.9% FLUSH: 3.0000 mL | Freq: Two times a day (BID) | INTRAVENOUS | Status: DC

## 2016-08-07 MED ORDER — MIDAZOLAM HCL 2 MG/2ML IJ SOLN
INTRAMUSCULAR | Status: AC
  Filled 2016-08-07: qty 2

## 2016-08-07 MED ORDER — CEFAZOLIN SODIUM-DEXTROSE 2-4 GM/100ML-% IV SOLN
2.0000 g | Freq: Three times a day (TID) | INTRAVENOUS | Status: AC
  Administered 2016-08-07 (×2): 2 g via INTRAVENOUS
  Filled 2016-08-07 (×2): qty 100

## 2016-08-07 MED ORDER — HYDROCHLOROTHIAZIDE 12.5 MG PO CAPS
12.5000 mg | ORAL_CAPSULE | Freq: Every day | ORAL | Status: DC
  Filled 2016-08-07: qty 1

## 2016-08-07 MED ORDER — OXYCODONE HCL ER 20 MG PO T12A
20.0000 mg | EXTENDED_RELEASE_TABLET | Freq: Two times a day (BID) | ORAL | Status: DC
  Administered 2016-08-07 – 2016-08-08 (×2): 20 mg via ORAL
  Filled 2016-08-07 (×2): qty 1

## 2016-08-07 MED ORDER — PHENYLEPHRINE HCL 10 MG/ML IJ SOLN
INTRAMUSCULAR | Status: DC | PRN
  Administered 2016-08-07: 80 ug via INTRAVENOUS

## 2016-08-07 MED ORDER — BUPIVACAINE HCL (PF) 0.5 % IJ SOLN
INTRAMUSCULAR | Status: DC | PRN
  Administered 2016-08-07: 4 mL

## 2016-08-07 MED ORDER — OXYCODONE-ACETAMINOPHEN 5-325 MG PO TABS: 1.0000 | ORAL_TABLET | Freq: Every day | ORAL | Status: DC

## 2016-08-07 MED ORDER — BUPROPION HCL ER (XL) 300 MG PO TB24
300.0000 mg | ORAL_TABLET | Freq: Every day | ORAL | Status: DC
  Administered 2016-08-08: 300 mg via ORAL
  Filled 2016-08-07: qty 1

## 2016-08-07 MED ORDER — ONDANSETRON HCL 4 MG/2ML IJ SOLN
INTRAMUSCULAR | Status: DC | PRN
  Administered 2016-08-07: 4 mg via INTRAVENOUS

## 2016-08-07 MED ORDER — OXYCODONE HCL 5 MG PO TABS: 5.0000 mg | ORAL_TABLET | Freq: Every day | ORAL | Status: DC

## 2016-08-07 MED ORDER — SURGIFOAM 100 EX MISC
CUTANEOUS | Status: DC | PRN
  Administered 2016-08-07: 09:00:00 via TOPICAL

## 2016-08-07 MED ORDER — PROPOFOL 10 MG/ML IV BOLUS
INTRAVENOUS | Status: DC | PRN
  Administered 2016-08-07: 200 mg via INTRAVENOUS

## 2016-08-07 MED ORDER — ACETAMINOPHEN 325 MG PO TABS: 650.0000 mg | ORAL_TABLET | ORAL | Status: DC | PRN

## 2016-08-07 MED ORDER — HYDROMORPHONE HCL 1 MG/ML IJ SOLN
INTRAMUSCULAR | Status: AC
  Filled 2016-08-07: qty 1

## 2016-08-07 MED ORDER — ACETAMINOPHEN 650 MG RE SUPP: 650.0000 mg | RECTAL | Status: DC | PRN

## 2016-08-07 MED ORDER — POTASSIUM CHLORIDE IN NACL 20-0.9 MEQ/L-% IV SOLN: INTRAVENOUS | Status: DC

## 2016-08-07 MED ORDER — ONDANSETRON HCL 4 MG/2ML IJ SOLN
4.0000 mg | Freq: Four times a day (QID) | INTRAMUSCULAR | Status: DC | PRN
Start: 2016-08-07 — End: 2016-08-07

## 2016-08-07 MED ORDER — CEFAZOLIN SODIUM-DEXTROSE 2-4 GM/100ML-% IV SOLN
INTRAVENOUS | Status: AC
  Filled 2016-08-07: qty 100

## 2016-08-07 MED ORDER — 0.9 % SODIUM CHLORIDE (POUR BTL) OPTIME
TOPICAL | Status: DC | PRN
  Administered 2016-08-07: 1000 mL

## 2016-08-07 MED ORDER — KETAMINE HCL 10 MG/ML IJ SOLN
INTRAMUSCULAR | Status: DC | PRN
  Administered 2016-08-07: 35 mg via INTRAVENOUS
  Administered 2016-08-07: 15 mg via INTRAVENOUS

## 2016-08-07 MED ORDER — CELECOXIB 200 MG PO CAPS
200.0000 mg | ORAL_CAPSULE | Freq: Two times a day (BID) | ORAL | Status: DC
  Administered 2016-08-07 – 2016-08-08 (×3): 200 mg via ORAL
  Filled 2016-08-07 (×3): qty 1

## 2016-08-07 MED ORDER — SENNA 8.6 MG PO TABS
1.0000 | ORAL_TABLET | Freq: Two times a day (BID) | ORAL | Status: DC
  Administered 2016-08-07 – 2016-08-08 (×3): 8.6 mg via ORAL
  Filled 2016-08-07 (×3): qty 1

## 2016-08-07 MED ORDER — OXYCODONE HCL 5 MG PO TABS
5.0000 mg | ORAL_TABLET | ORAL | Status: DC | PRN
  Administered 2016-08-07 – 2016-08-08 (×5): 5 mg via ORAL
  Filled 2016-08-07 (×6): qty 1

## 2016-08-07 MED ORDER — BUPIVACAINE HCL (PF) 0.5 % IJ SOLN
INTRAMUSCULAR | Status: AC
  Filled 2016-08-07: qty 30

## 2016-08-07 MED ORDER — LISINOPRIL-HYDROCHLOROTHIAZIDE 20-12.5 MG PO TABS: 1.0000 | ORAL_TABLET | Freq: Every day | ORAL | Status: DC

## 2016-08-07 MED ORDER — MORPHINE SULFATE (PF) 4 MG/ML IV SOLN
2.0000 mg | INTRAVENOUS | Status: DC | PRN
  Administered 2016-08-07: 2 mg via INTRAVENOUS
  Filled 2016-08-07: qty 1

## 2016-08-07 MED ORDER — FENTANYL CITRATE (PF) 250 MCG/5ML IJ SOLN
INTRAMUSCULAR | Status: AC
  Filled 2016-08-07: qty 5

## 2016-08-07 MED ORDER — ONDANSETRON HCL 4 MG PO TABS: 4.0000 mg | ORAL_TABLET | Freq: Four times a day (QID) | ORAL | Status: DC | PRN

## 2016-08-07 MED ORDER — SODIUM CHLORIDE 0.9 % IV SOLN: 250.0000 mL | INTRAVENOUS | Status: DC

## 2016-08-07 MED ORDER — KETAMINE HCL-SODIUM CHLORIDE 100-0.9 MG/10ML-% IV SOSY
PREFILLED_SYRINGE | INTRAVENOUS | Status: AC
  Filled 2016-08-07: qty 10

## 2016-08-07 MED ORDER — SODIUM CHLORIDE 0.9% FLUSH: 3.0000 mL | INTRAVENOUS | Status: DC | PRN

## 2016-08-07 MED ORDER — PROPOFOL 10 MG/ML IV BOLUS
INTRAVENOUS | Status: AC
  Filled 2016-08-07: qty 20

## 2016-08-07 MED ORDER — AMLODIPINE BESYLATE 5 MG PO TABS
5.0000 mg | ORAL_TABLET | Freq: Every day | ORAL | Status: DC | PRN
Start: 2016-08-07 — End: 2016-08-08
  Filled 2016-08-07: qty 1

## 2016-08-07 MED ORDER — ROCURONIUM BROMIDE 100 MG/10ML IV SOLN
INTRAVENOUS | Status: DC | PRN
  Administered 2016-08-07: 50 mg via INTRAVENOUS
  Administered 2016-08-07: 10 mg via INTRAVENOUS
  Administered 2016-08-07: 20 mg via INTRAVENOUS

## 2016-08-07 MED ORDER — MIDAZOLAM HCL 5 MG/5ML IJ SOLN
INTRAMUSCULAR | Status: DC | PRN
  Administered 2016-08-07: 2 mg via INTRAVENOUS

## 2016-08-07 MED ORDER — VANCOMYCIN HCL 1000 MG IV SOLR
INTRAVENOUS | Status: DC | PRN
  Administered 2016-08-07: 1000 mg via TOPICAL

## 2016-08-07 MED ORDER — OXYCODONE-ACETAMINOPHEN 5-325 MG PO TABS
1.0000 | ORAL_TABLET | ORAL | Status: DC | PRN
  Administered 2016-08-07 – 2016-08-08 (×7): 1 via ORAL
  Filled 2016-08-07 (×6): qty 1

## 2016-08-07 MED ORDER — HYDROMORPHONE HCL 1 MG/ML IJ SOLN
0.2500 mg | INTRAMUSCULAR | Status: DC | PRN
  Administered 2016-08-07 (×4): 0.5 mg via INTRAVENOUS

## 2016-08-07 MED ORDER — PHENYLEPHRINE HCL 10 MG/ML IJ SOLN
INTRAVENOUS | Status: DC | PRN
  Administered 2016-08-07: 20 ug/min via INTRAVENOUS

## 2016-08-07 MED ORDER — PHENOL 1.4 % MT LIQD: 1.0000 | OROMUCOSAL | Status: DC | PRN

## 2016-08-07 MED ORDER — HYDROMORPHONE HCL 1 MG/ML IJ SOLN
INTRAMUSCULAR | Status: AC
  Filled 2016-08-07: qty 2

## 2016-08-07 MED ORDER — LACTATED RINGERS IV SOLN
INTRAVENOUS | Status: DC | PRN
  Administered 2016-08-07 (×2): via INTRAVENOUS

## 2016-08-07 MED ORDER — LISINOPRIL 20 MG PO TABS
20.0000 mg | ORAL_TABLET | Freq: Every day | ORAL | Status: DC
  Filled 2016-08-07: qty 1

## 2016-08-07 MED ORDER — THROMBIN 20000 UNITS EX SOLR
CUTANEOUS | Status: AC
  Filled 2016-08-07: qty 20000

## 2016-08-07 MED ORDER — OXYCODONE-ACETAMINOPHEN 10-325 MG PO TABS: 1.0000 | ORAL_TABLET | Freq: Every day | ORAL | Status: DC

## 2016-08-07 MED ORDER — SUGAMMADEX SODIUM 200 MG/2ML IV SOLN
INTRAVENOUS | Status: DC | PRN
  Administered 2016-08-07: 200 mg via INTRAVENOUS

## 2016-08-07 MED ORDER — FENTANYL CITRATE (PF) 100 MCG/2ML IJ SOLN
INTRAMUSCULAR | Status: DC | PRN
  Administered 2016-08-07: 25 ug via INTRAVENOUS
  Administered 2016-08-07 (×4): 50 ug via INTRAVENOUS
  Administered 2016-08-07: 150 ug via INTRAVENOUS
  Administered 2016-08-07: 25 ug via INTRAVENOUS

## 2016-08-07 MED ORDER — DOCUSATE SODIUM 100 MG PO CAPS
200.0000 mg | ORAL_CAPSULE | Freq: Every day | ORAL | Status: DC
  Administered 2016-08-08: 200 mg via ORAL
  Filled 2016-08-07: qty 2

## 2016-08-07 SURGICAL SUPPLY — 63 items
ADH SKN CLS APL DERMABOND .7 (GAUZE/BANDAGES/DRESSINGS) ×1
APL SKNCLS STERI-STRIP NONHPOA (GAUZE/BANDAGES/DRESSINGS) ×1
BAG DECANTER FOR FLEXI CONT (MISCELLANEOUS) ×3 IMPLANT
BASKET BONE COLLECTION (BASKET) ×3 IMPLANT
BENZOIN TINCTURE PRP APPL 2/3 (GAUZE/BANDAGES/DRESSINGS) ×3 IMPLANT
BLADE CLIPPER SURG (BLADE) IMPLANT
BONE CANC CHIPS 20CC PCAN1/4 (Bone Implant) ×3 IMPLANT
BUR MATCHSTICK NEURO 3.0 LAGG (BURR) ×3 IMPLANT
CANISTER SUCT 3000ML PPV (MISCELLANEOUS) ×3 IMPLANT
CARTRIDGE OIL MAESTRO DRILL (MISCELLANEOUS) ×1 IMPLANT
CHIPS CANC BONE 20CC PCAN1/4 (Bone Implant) ×1 IMPLANT
CLOSURE WOUND 1/2 X4 (GAUZE/BANDAGES/DRESSINGS) ×1
CONT SPEC 4OZ CLIKSEAL STRL BL (MISCELLANEOUS) ×3 IMPLANT
COVER BACK TABLE 60X90IN (DRAPES) ×3 IMPLANT
DERMABOND ADVANCED (GAUZE/BANDAGES/DRESSINGS) ×2
DERMABOND ADVANCED .7 DNX12 (GAUZE/BANDAGES/DRESSINGS) IMPLANT
DIFFUSER DRILL AIR PNEUMATIC (MISCELLANEOUS) ×3 IMPLANT
DRAPE C-ARM 42X72 X-RAY (DRAPES) ×6 IMPLANT
DRAPE LAPAROTOMY 100X72X124 (DRAPES) ×3 IMPLANT
DRAPE POUCH INSTRU U-SHP 10X18 (DRAPES) ×3 IMPLANT
DRAPE SURG 17X23 STRL (DRAPES) ×3 IMPLANT
DRSG OPSITE POSTOP 4X6 (GAUZE/BANDAGES/DRESSINGS) ×2 IMPLANT
DURAPREP 26ML APPLICATOR (WOUND CARE) ×3 IMPLANT
ELECT REM PT RETURN 9FT ADLT (ELECTROSURGICAL) ×3
ELECTRODE REM PT RTRN 9FT ADLT (ELECTROSURGICAL) ×1 IMPLANT
EVACUATOR 1/8 PVC DRAIN (DRAIN) ×3 IMPLANT
GAUZE SPONGE 4X4 16PLY XRAY LF (GAUZE/BANDAGES/DRESSINGS) IMPLANT
GLOVE BIO SURGEON STRL SZ7 (GLOVE) IMPLANT
GLOVE BIO SURGEON STRL SZ8 (GLOVE) ×6 IMPLANT
GLOVE BIOGEL PI IND STRL 7.0 (GLOVE) IMPLANT
GLOVE BIOGEL PI INDICATOR 7.0 (GLOVE)
GOWN STRL REUS W/ TWL LRG LVL3 (GOWN DISPOSABLE) IMPLANT
GOWN STRL REUS W/ TWL XL LVL3 (GOWN DISPOSABLE) ×2 IMPLANT
GOWN STRL REUS W/TWL 2XL LVL3 (GOWN DISPOSABLE) IMPLANT
GOWN STRL REUS W/TWL LRG LVL3 (GOWN DISPOSABLE)
GOWN STRL REUS W/TWL XL LVL3 (GOWN DISPOSABLE) ×6
GRAFT BNE CANC CHIPS 1-8 20CC (Bone Implant) IMPLANT
HEMOSTAT POWDER KIT SURGIFOAM (HEMOSTASIS) ×2 IMPLANT
KIT BASIN OR (CUSTOM PROCEDURE TRAY) ×3 IMPLANT
KIT INFUSE XX SMALL 0.7CC (Orthopedic Implant) ×2 IMPLANT
KIT ROOM TURNOVER OR (KITS) ×3 IMPLANT
NDL HYPO 25X1 1.5 SAFETY (NEEDLE) ×1 IMPLANT
NEEDLE HYPO 25X1 1.5 SAFETY (NEEDLE) ×3 IMPLANT
NS IRRIG 1000ML POUR BTL (IV SOLUTION) ×3 IMPLANT
OIL CARTRIDGE MAESTRO DRILL (MISCELLANEOUS) ×3
PACK LAMINECTOMY NEURO (CUSTOM PROCEDURE TRAY) ×3 IMPLANT
PAD ARMBOARD 7.5X6 YLW CONV (MISCELLANEOUS) ×9 IMPLANT
ROD PC 5.5X35 TI ARSENAL (Rod) ×4 IMPLANT
SCREW CBX 5.5X35MM ARSENAL (Screw) ×4 IMPLANT
SCREW CORT-CANC 6.5X40MM (Screw) ×4 IMPLANT
SCREW SET SPINAL ARSENAL 47127 (Screw) ×8 IMPLANT
SPACER PEEK PS 25X8MM 9MM 5DEG (Spacer) ×4 IMPLANT
SPONGE LAP 4X18 X RAY DECT (DISPOSABLE) IMPLANT
SPONGE SURGIFOAM ABS GEL 100 (HEMOSTASIS) ×3 IMPLANT
STRIP CLOSURE SKIN 1/2X4 (GAUZE/BANDAGES/DRESSINGS) ×3 IMPLANT
SUT VIC AB 0 CT1 18XCR BRD8 (SUTURE) ×1 IMPLANT
SUT VIC AB 0 CT1 8-18 (SUTURE) ×3
SUT VIC AB 2-0 CP2 18 (SUTURE) ×3 IMPLANT
SUT VIC AB 3-0 SH 8-18 (SUTURE) ×6 IMPLANT
TOWEL GREEN STERILE (TOWEL DISPOSABLE) ×3 IMPLANT
TOWEL GREEN STERILE FF (TOWEL DISPOSABLE) ×5 IMPLANT
TRAY FOLEY W/METER SILVER 16FR (SET/KITS/TRAYS/PACK) ×3 IMPLANT
WATER STERILE IRR 1000ML POUR (IV SOLUTION) ×3 IMPLANT

## 2016-08-07 NOTE — Evaluation (Signed)
Occupational Therapy Evaluation Patient Details Name: Benjamin Solomon MRN: [id number] DOB: 1965/03/07 Today's Date: 08/07/2016    History of Present Illness Pt is 51 y/o male s/p L5-S1 PLIF. PMH includes anxiety, depression, HTN, R foot surgery, and cervical spine surgery.    Clinical Impression   PTA, pt was utilizing cane for functional mobility and required occasional assistance for LB dressing tasks. He currently requires mod assist for LB ADL and min guard assist for toilet transfers. Initiated education with pt and family concerning brace wear schedule, back precautions related to ADL, and compensatory strategies for dressing and grooming tasks to maximize safety and independence. They verbalize understanding and would benefit from further reinforcement. Pt would benefit from continued OT services while admitted to improve independence with ADL and functional mobility with next session to focus on safe shower transfers. Will continue to follow while admitted.    Follow Up Recommendations  No OT follow up;Supervision/Assistance - 24 hour (initial 24 hour assist)    Equipment Recommendations  3 in 1 bedside commode    Recommendations for Other Services       Precautions / Restrictions Precautions Precautions: Back Precaution Booklet Issued: Yes (comment) Precaution Comments: Reviewed back precaution handout with pt.  Required Braces or Orthoses: Spinal Brace Spinal Brace: Lumbar corset;Applied in standing position Restrictions Weight Bearing Restrictions: No      Mobility Bed Mobility Overal bed mobility: Needs Assistance Bed Mobility: Rolling;Sidelying to Sit;Sit to Sidelying Rolling: Supervision Sidelying to sit: Min assist     Sit to sidelying: Min guard General bed mobility comments: Min assist to raise trunk without twisting during log roll/sidelying to sit. VC's for safe technique.   Transfers Overall transfer level: Needs assistance Equipment used: Rolling  walker (2 wheeled) Transfers: Sit to/from Stand Sit to Stand: Min guard         General transfer comment: Min guard assist for safety. VC's for safe hand placement and to avoid twisting.     Balance Overall balance assessment: Needs assistance Sitting-balance support: No upper extremity supported;Feet supported Sitting balance-Leahy Scale: Fair     Standing balance support: Bilateral upper extremity supported;No upper extremity supported;During functional activity Standing balance-Leahy Scale: Fair                             ADL either performed or assessed with clinical judgement   ADL Overall ADL's : Needs assistance/impaired Eating/Feeding: Sitting;Set up   Grooming: Supervision/safety;Standing   Upper Body Bathing: Supervision/ safety;Sitting   Lower Body Bathing: Moderate assistance;Sit to/from stand   Upper Body Dressing : Minimal assistance;Sitting Upper Body Dressing Details (indicate cue type and reason): assist for brace Lower Body Dressing: Moderate assistance;Sit to/from stand   Toilet Transfer: Min guard;Ambulation;RW;BSC   Toileting- Water quality scientist and Hygiene: Min guard;Sit to/from stand       Functional mobility during ADLs: Min guard;Rolling walker General ADL Comments: Pt educated on safe toilet transfers and home modification to maximize safety and adherence to back precautions.      Vision Patient Visual Report: No change from baseline Vision Assessment?: No apparent visual deficits     Perception     Praxis      Pertinent Vitals/Pain Pain Assessment: 0-10 Pain Score: 8  Faces Pain Scale: Hurts even more Pain Location: back  Pain Descriptors / Indicators: Aching;Operative site guarding;Sore Pain Intervention(s): Limited activity within patient's tolerance;Monitored during session;Premedicated before session     Hand Dominance  Extremity/Trunk Assessment Upper Extremity Assessment Upper Extremity Assessment:  Overall WFL for tasks assessed   Lower Extremity Assessment Lower Extremity Assessment: Defer to PT evaluation LLE Deficits / Details: L knee buckling noted.        Communication Communication Communication: No difficulties   Cognition Arousal/Alertness: Awake/alert Behavior During Therapy: WFL for tasks assessed/performed Overall Cognitive Status: Within Functional Limits for tasks assessed                                     General Comments       Exercises     Shoulder Instructions      Home Living Family/patient expects to be discharged to:: Private residence Living Arrangements: Spouse/significant other Available Help at Discharge: Family;Available PRN/intermittently Type of Home: House Home Access: Stairs to enter [employer] of Steps: 2 Entrance Stairs-Rails: Right;Left;Can reach both Home Layout: Two level;Able to live on main level with bedroom/bathroom     Bathroom Shower/Tub: Occupational psychologist: Standard     Home Equipment: Cane - single point;Shower seat;Grab bars - tub/shower          Prior Functioning/Environment Level of Independence: Independent with assistive device(s)        Comments: Used cane for ambulation         OT Problem List: Decreased activity tolerance;Impaired balance (sitting and/or standing);Decreased safety awareness;Decreased knowledge of use of DME or AE;Decreased knowledge of precautions;Pain      OT Treatment/Interventions: Self-care/ADL training;Energy conservation;DME and/or AE instruction;Therapeutic activities;Patient/family education;Balance training;Therapeutic exercise    OT Goals(Current goals can be found in the care plan section) Acute Rehab OT Goals Patient Stated Goal: to have less pain OT Goal Formulation: With patient/family Time For Goal Achievement: 08/21/16 Potential to Achieve Goals: Good ADL Goals Pt Will Perform Grooming: with modified  independence;standing Pt Will Perform Lower Body Bathing: with adaptive equipment;sit to/from stand;with supervision (with or without AE) Pt Will Perform Lower Body Dressing: with adaptive equipment;sit to/from stand;with supervision (with or without AE) Pt Will Transfer to Toilet: with modified independence Pt Will Perform Toileting - Clothing Manipulation and hygiene: with modified independence;sit to/from stand Pt Will Perform Tub/Shower Transfer: with supervision;ambulating;rolling walker;shower seat;Shower transfer Additional ADL Goal #1: Pt will adhere to 3/3 back precautions during morning ADL routine independently.   OT Frequency: Min 2X/week   Barriers to D/C:            Co-evaluation              AM-PAC PT "6 Clicks" Daily Activity     Outcome Measure Help from another person eating meals?: None Help from another person taking care of personal grooming?: A Little Help from another person toileting, which includes using toliet, bedpan, or urinal?: A Little Help from another person bathing (including washing, rinsing, drying)?: A Little Help from another person to put on and taking off regular upper body clothing?: A Little Help from another person to put on and taking off regular lower body clothing?: A Little 6 Click Score: 19   End of Session Equipment Utilized During Treatment: Rolling walker;Back brace Nurse Communication: Mobility status  Activity Tolerance: Patient tolerated treatment well Patient left: in bed;with call bell/phone within reach;with family/visitor present  OT Visit Diagnosis: Unsteadiness on feet (R26.81);Other abnormalities of gait and mobility (R26.89);Pain Pain - Right/Left:  (back) Pain - part of body:  (back)  Time: 8250-0370 OT Time Calculation (min): 19 min Charges:  OT General Charges $OT Visit: 1 Procedure OT Evaluation $OT Eval Moderate Complexity: 1 Procedure G-Codes:     Norman Herrlich, MS OTR/L  Pager:  [place] A Morrissa Shein 08/07/2016, 5:44 PM

## 2016-08-07 NOTE — Op Note (Signed)
08/07/2016  10:11 AM  PATIENT:  Benjamin Solomon  51 y.o. male  PRE-OPERATIVE DIAGNOSIS:  Degenerative disc disease L5-S1, retrolisthesis, foraminal stenosis L5-S1, chronic back and leg pain  POST-OPERATIVE DIAGNOSIS:  same  PROCEDURE:   1. Decompressive lumbar laminectomy L5-S1 requiring more work than would be required for a simple exposure of the disk for PLIF in order to adequately decompress the neural elements and address the spinal and foraminal stenosis 2. Posterior lumbar interbody fusion L5-S1 using PEEK interbody cages packed with morcellized allograft and autograft 3. Posterior fixation L5-S1 using Alphatec cortical pedicle screws.  4. Intertransverse arthrodesis L5-S1 using morcellized autograft and allograft.  SURGEON:  Sherley Bounds, MD  ASSISTANTS: Dr. Cyndy Freeze  ANESTHESIA:  General  EBL: 100 ml  Total I/O In: 1000 [I.V.:1000] Out: 100 [Blood:100]  BLOOD ADMINISTERED:none  DRAINS: none   INDICATION FOR PROCEDURE: This patient presented with severe chronic back and leg pain. Imaging revealed severe degenerative disc disease L5-S1 with retrolisthesis and severe foraminal stenosis. The patient tried a reasonable attempt at conservative medical measures without relief. I recommended decompression and instrumented fusion to address the stenosis but I felt full facetectomies would have to be done in order to adequately decompress the L5 nerve roots and that would necessitate fusion.  Patient understood the risks, benefits, and alternatives and potential outcomes and wished to proceed.  PROCEDURE DETAILS:  The patient was brought to the operating room. After induction of generalized endotracheal anesthesia the patient was rolled into the prone position on chest rolls and all pressure points were padded. The patient's lumbar region was cleaned and then prepped with DuraPrep and draped in the usual sterile fashion. Anesthesia was injected and then a dorsal midline incision was  made and carried down to the lumbosacral fascia. The fascia was opened and the paraspinous musculature was taken down in a subperiosteal fashion to expose L5-S1. A self-retaining retractor was placed. Intraoperative fluoroscopy confirmed my level, and I started with placement of the L5 cortical pedicle screws. The pedicle screw entry zones were identified utilizing surface landmarks and  AP and lateral fluoroscopy. I scored the cortex with the high-speed drill and then used the hand drill to drill an upward and outward direction into the pedicle. I then tapped line to line, and the tap was also monitored. I then placed a 5.5 x 35 mm cortical pedicle screw into the pedicles of L5 bilaterally. I then turned my attention to the decompression and complete lumbar laminectomies, hemi- facetectomies, and foraminotomies were performed at L5-S1. The patient had significant foraminal stenosis and this required more work than would be required for a simple exposure of the disc for posterior lumbar interbody fusion in which a much more limited laminotomy could be performed. Much more generous decompression and wide foraminotomy was undertaken in order to adequately decompress the neural elements and address the patient's leg pain. The yellow ligament was removed to expose the underlying dura and nerve roots, and generous foraminotomies were performed to adequately decompress the neural elements. Both the exiting and traversing nerve roots were decompressed on both sides until a coronary dilator passed easily along the nerve roots. Once the decompression was complete, I turned my attention to the posterior lower lumbar interbody fusion. The epidural venous vasculature was coagulated and cut sharply. Disc space was incised and the initial discectomy was performed with pituitary rongeurs. The disc space was distracted with sequential distractors to a height of 8 mm. We then used a series of scrapers and shavers  to prepare the  endplates for fusion. The midline was prepared with Epstein curettes. Once the complete discectomy was finished, we packed an appropriate sized peek interbody cage with local autograft and morcellized allograft, gently retracted the nerve root, and tapped the cage into position at L5-S1.  The midline between the cages was packed with morselized autograft and allograft. I typically do not use BMP but in this particular case the patient is a smoker and pseudoarthrosis at L5-S1 is much more common. Therefore I packed an extra extra small BMP in the anterior disc space at L5-S1 prior to placing the second cage and the autograft/allograft mixture. We then turned our attention to the placement of the lower pedicle screws. The pedicle screw entry zones were identified utilizing surface landmarks and fluoroscopy. I drilled into each pedicle utilizing the hand drill, and tapped each pedicle with the appropriate tap. We palpated with a ball probe to assure no break in the cortex. We then placed 6.5 x 40 mm pedicle screws into the pedicles bilaterally at S1. We then decorticated the transverse processes and laid a mixture of morcellized autograft and allograft out over these to perform intertransverse arthrodesis at L5-S1. We then placed lordotic rods into the multiaxial screw heads of the pedicle screws and locked these in position with the locking caps and anti-torque device. We then checked our construct with AP and lateral fluoroscopy. Irrigated with copious amounts of bacitracin-containing saline solution. Inspected the nerve roots once again to assure adequate decompression, lined to the dura with Gelfoam, placed powdered vancomycin into the wound and closed the muscle and the fascia with 0 Vicryl. Closed the subcutaneous tissues with 2-0 Vicryl and subcuticular tissues with 3-0 Vicryl. The skin was closed with benzoin and Steri-Strips. Dressing was then applied, the patient was awakened from general anesthesia and  transported to the recovery room in stable condition. At the end of the procedure all sponge, needle and instrument counts were correct.   PLAN OF CARE: admit to inpatient  PATIENT DISPOSITION:  PACU - hemodynamically stable.   Delay start of Pharmacological VTE agent (>24hrs) due to surgical blood loss or risk of bleeding:  yes

## 2016-08-07 NOTE — H&P (Signed)
Subjective: Patient is a 51 y.o. male admitted for PLIF. Onset of symptoms was several years ago, gradually worsening since that time.  The pain is rated severe, unremitting, and is located at the across the lower back and radiates to legs. The pain is described as aching and occurs all day. The symptoms have been progressive. Symptoms are exacerbated by exercise. MRI or CT showed DDD L5-S1   Past Medical History:  Diagnosis Date  . Anxiety   . Arthritis   . Depression   . Hemorrhoids   . Hyperplastic colon polyp   . Hypertension   . Tubular adenoma     Past Surgical History:  Procedure Laterality Date  . CERVICAL SPINE SURGERY  2009  . COLONOSCOPY    . EVALUATION UNDER ANESTHESIA WITH HEMORRHOIDECTOMY  12/20/2011  . FOOT SURGERY Right 2012   bone removed from the 5 th toe    Prior to Admission medications   Medication Sig Start Date End Date Taking? Authorizing Provider  ALPRAZolam Duanne Moron) 0.5 MG tablet Take 0.5 mg by mouth at bedtime as needed for sleep.  06/02/16  Yes [provider]  amLODipine (NORVASC) 5 MG tablet Take 5 mg by mouth daily as needed (for high blood pressure).  01/27/13  Yes [provider]  baclofen (LIORESAL) 10 MG tablet Take 10 mg by mouth 3 (three) times daily.   Yes [provider]  buPROPion (WELLBUTRIN XL) 300 MG 24 hr tablet Take 300 mg by mouth daily. 05/18/16  Yes [provider]  clindamycin (CLEOCIN T) 1 % external solution Apply 1 application topically 2 (two) times daily as needed (boils).  10/13/15  Yes [provider]  clindamycin (CLEOCIN) 300 MG capsule Take 300 mg by mouth 2 (two) times daily as needed (boils).  10/12/15  Yes [provider]  docusate sodium (COLACE) 100 MG capsule Take 200 mg by mouth daily.   Yes [provider]  lisinopril-hydrochlorothiazide (PRINZIDE,ZESTORETIC) 20-12.5 MG per tablet Take 1 tablet by mouth daily.   Yes [provider]  oxyCODONE-acetaminophen  (PERCOCET) 10-325 MG per tablet Take 1 tablet by mouth 5 (five) times daily.    Yes [provider]  OXYCONTIN 20 MG 12 hr tablet Take 20 mg by mouth every 12 (twelve) hours.  06/25/16  Yes [provider]  tetrahydrozoline 0.05 % ophthalmic solution Place 2 drops into both eyes daily as needed (dry eyes).   Yes [provider]  UNABLE TO FIND Valium 10 mg suppositories-insert one daily rectally as needed for pain 03/01/16  Yes [provider]  HYDROcodone-acetaminophen (NORCO/VICODIN) 5-325 MG per tablet Take 1-2 tablets by mouth every 6 (six) hours as needed. Patient not taking: Reported on 03/21/2014 11/13/13   Hyman Bible, PA-C  ibuprofen (ADVIL,MOTRIN) 600 MG tablet Take 1 tablet (600 mg total) by mouth every 6 (six) hours as needed. Patient not taking: Reported on 07/26/2016 03/21/14   Evalee Jefferson, PA-C  ondansetron (ZOFRAN) 4 MG tablet Take 1 tablet (4 mg total) by mouth every 6 (six) hours. Patient not taking: Reported on 03/21/2014 11/13/13   Hyman Bible, PA-C  oxyCODONE-acetaminophen (PERCOCET) 7.5-325 MG per tablet Take 1 tablet by mouth every 6 (six) hours as needed for pain. Patient not taking: Reported on 03/21/2014 11/16/13   Alfredia Ferguson, PA-C   No Known Allergies  Social History  Substance Use Topics  . Smoking status: Current Every Day Smoker    Packs/day: 0.25    Years: 20.00  Types: Cigarettes  . Smokeless tobacco: Never Used     Comment: form given 11/16/13  . Alcohol use No    Family History  Problem Relation Age of Onset  . Hypertension Mother   . Hypertension Father   . Alcohol abuse Brother   . Drug abuse Brother   . Diabetes Neg Hx   . Heart disease Neg Hx   . Kidney disease Neg Hx   . Liver disease Neg Hx      Review of Systems  Positive ROS: neg  All other systems have been reviewed and were otherwise negative with the exception of those mentioned in the HPI and as above.  Objective: Vital signs in last 24  hours: Temp:  [98.5 F (36.9 C)] 98.5 F (36.9 C) (07/25 0626) Pulse Rate:  [82] 82 (07/25 0626) Resp:  [20] 20 (07/25 0626) BP: (122)/(88) 122/88 (07/25 0626) SpO2:  [98 %] 98 % (07/25 0626) Weight:  [74.4 kg (164 lb)] 74.4 kg (164 lb) (07/25 0626)  General Appearance: Alert, cooperative, no distress, appears stated age Head: Normocephalic, without obvious abnormality, atraumatic Eyes: PERRL, conjunctiva/corneas clear, EOM's intact    Neck: Supple, symmetrical, trachea midline Back: Symmetric, no curvature, ROM normal, no CVA tenderness Lungs:  respirations unlabored Heart: Regular rate and rhythm Abdomen: Soft, non-tender Extremities: Extremities normal, atraumatic, no cyanosis or edema Pulses: 2+ and symmetric all extremities Skin: Skin color, texture, turgor normal, no rashes or lesions  NEUROLOGIC:   Mental status: Alert and oriented x4,  no aphasia, good attention span, fund of knowledge, and memory Motor Exam - grossly normal Sensory Exam - grossly normal Reflexes: 1+ Coordination - grossly normal Gait - grossly normal Balance - grossly normal Cranial Nerves: I: smell Not tested  II: visual acuity  OS: nl    OD: nl  II: visual fields Full to confrontation  II: pupils Equal, round, reactive to light  III,VII: ptosis None  III,IV,VI: extraocular muscles  Full ROM  V: mastication Normal  V: facial light touch sensation  Normal  V,VII: corneal reflex  Present  VII: facial muscle function - upper  Normal  VII: facial muscle function - lower Normal  VIII: hearing Not tested  IX: soft palate elevation  Normal  IX,X: gag reflex Present  XI: trapezius strength  5/5  XI: sternocleidomastoid strength 5/5  XI: neck flexion strength  5/5  XII: tongue strength  Normal    Data Review Lab Results  Component Value Date   WBC 9.8 07/30/2016   HGB 13.7 07/30/2016   HCT 39.5 07/30/2016   MCV 88.6 07/30/2016   PLT 257 07/30/2016   Lab Results  Component Value Date    NA 134 (L) 07/30/2016   K 3.8 07/30/2016   CL 102 07/30/2016   CO2 25 07/30/2016   BUN 9 07/30/2016   CREATININE 1.29 (H) 07/30/2016   GLUCOSE 106 (H) 07/30/2016   Lab Results  Component Value Date   INR 0.93 07/30/2016    Assessment/Plan: Patient admitted for PLIF L5-s1. Patient has failed a reasonable attempt at conservative therapy.  I explained the condition and procedure to the patient and answered any questions.  Patient wishes to proceed with procedure as planned. Understands risks/ benefits and typical outcomes of procedure.   Benjamin Solomon,George S 08/07/2016 7:18 AM

## 2016-08-07 NOTE — Transfer of Care (Signed)
Immediate Anesthesia Transfer of Care Note  Patient: Benjamin Solomon  Procedure(s) Performed: Procedure(s): Posterior Lateral Interbody Fusion - Lumbar five-Sacral one (N/A)  Patient Location: PACU  Anesthesia Type:General  Level of Consciousness: awake, alert  and oriented  Airway & Oxygen Therapy: Patient Spontanous Breathing and Patient connected to nasal cannula oxygen  Post-op Assessment: Report given to RN, Post -op Vital signs reviewed and stable and Patient moving all extremities X 4  Post vital signs: Reviewed and stable  Last Vitals:  Vitals:   08/07/16 0626  BP: 122/88  Pulse: 82  Resp: 20  Temp: 36.9 C    Last Pain:  Vitals:   08/07/16 0626  TempSrc: Oral  PainSc:       Patients Stated Pain Goal: 3 (11/08/83 2778)  Complications: No apparent anesthesia complications

## 2016-08-07 NOTE — Anesthesia Preprocedure Evaluation (Addendum)
Anesthesia Evaluation  Patient identified by MRN, date of birth, ID band Patient awake    Reviewed: Allergy & Precautions, H&P , NPO status , Patient's Chart, lab work & pertinent test results  Airway Mallampati: II  TM Distance: >3 FB Neck ROM: full    Dental  (+) Teeth Intact, Dental Advisory Given   Pulmonary Current Smoker,    breath sounds clear to auscultation       Cardiovascular hypertension,  Rhythm:regular Rate:Normal     Neuro/Psych PSYCHIATRIC DISORDERS Anxiety Depression    GI/Hepatic   Endo/Other    Renal/GU      Musculoskeletal  (+) Arthritis ,   Abdominal   Peds  Hematology   Anesthesia Other Findings   Reproductive/Obstetrics                            Anesthesia Physical Anesthesia Plan  ASA: II  Anesthesia Plan: General   Post-op Pain Management:    Induction: Intravenous  PONV Risk Score and Plan: 2 and Ondansetron, Dexamethasone, Treatment may vary due to age or medical condition and Midazolam  Airway Management Planned: Oral ETT  Additional Equipment:   Intra-op Plan:   Post-operative Plan: Extubation in OR  Informed Consent: I have reviewed the patients History and Physical, chart, labs and discussed the procedure including the risks, benefits and alternatives for the proposed anesthesia with the patient or authorized representative who has indicated his/her understanding and acceptance.     Plan Discussed with: CRNA, Anesthesiologist and Surgeon  Anesthesia Plan Comments:         Anesthesia Quick Evaluation

## 2016-08-07 NOTE — Anesthesia Procedure Notes (Signed)
Procedure Name: Intubation Date/Time: 08/07/2016 7:40 AM Performed by: Gaylene Brooks Pre-anesthesia Checklist: Patient identified, Emergency Drugs available, Suction available and Patient being monitored Patient Re-evaluated:Patient Re-evaluated prior to induction Oxygen Delivery Method: Circle System Utilized Preoxygenation: Pre-oxygenation with 100% oxygen Induction Type: IV induction Ventilation: Mask ventilation without difficulty Laryngoscope Size: Miller and 2 Grade View: Grade II Tube type: Oral Tube size: 7.5 mm Number of attempts: 1 Airway Equipment and Method: Stylet and Oral airway Placement Confirmation: ETT inserted through vocal cords under direct vision,  positive ETCO2 and breath sounds checked- equal and bilateral Secured at: 23 cm Tube secured with: Tape Dental Injury: Teeth and Oropharynx as per pre-operative assessment

## 2016-08-07 NOTE — Evaluation (Signed)
Physical Therapy Evaluation Patient Details Name: Benjamin Solomon MRN: [id number] DOB: [birth year] Today's Date: 08/07/2016   History of Present Illness  Pt is 51 y/o male s/p L5-S1 PLIF. PMH includes anxiety, depression, HTN, R foot surgery, and cervical spine surgery.   Clinical Impression  Patient is s/p above surgery resulting in the deficits listed below (see PT Problem List). PTA, pt was using cane for ambulation secondary to LLE pain. Upon eval, pt with post op pain, as well as, L knee buckling, decreased balance, and decreased strength. Min to min guard assist required for mobility this session. Will need DME below. Patient will benefit from skilled PT to increase their independence and safety with mobility (while adhering to their precautions) to allow discharge to the venue listed below.     Follow Up Recommendations No PT follow up;Supervision for mobility/OOB    Equipment Recommendations  Rolling walker with 5" wheels;3in1 (PT)    Recommendations for Other Services       Precautions / Restrictions Precautions Precautions: Back Precaution Booklet Issued: Yes (comment) Precaution Comments: Reviewed back precaution handout with pt.  Required Braces or Orthoses: Spinal Brace Spinal Brace: Lumbar corset;Applied in standing position Restrictions Weight Bearing Restrictions: No      Mobility  Bed Mobility Overal bed mobility: Needs Assistance Bed Mobility: Rolling;Sidelying to Sit;Sit to Sidelying Rolling: Supervision Sidelying to sit: Min assist     Sit to sidelying: Min guard General bed mobility comments: Min assist to raise trunk without twisting during log roll/sidelying to sit. VC's for safe technique.   Transfers Overall transfer level: Needs assistance Equipment used: Rolling walker (2 wheeled) Transfers: Sit to/from Stand Sit to Stand: Min guard         General transfer comment: Min guard assist for safety. VC's for safe hand placement and to avoid  twisting.   Ambulation/Gait Ambulation/Gait assistance: Min guard Ambulation Distance (Feet): 100 Feet Assistive device: Rolling walker (2 wheeled) Gait Pattern/deviations: Step-through pattern;Decreased stride length;Antalgic (L knee buckling ) Gait velocity: Decreased Gait velocity interpretation: Below normal speed for age/gender General Gait Details: L knee buckling noted during gait. Limited distance secondary to back pain and LLE pain. Educated about use of RW at home to increase safety with mobility. Verbal cues for appropriate sequencing and proximity to RW. Educated about performing generalized walking program at home.   Stairs            Wheelchair Mobility    Modified Rankin (Stroke Patients Only)       Balance Overall balance assessment: Needs assistance Sitting-balance support: No upper extremity supported;Feet supported Sitting balance-Leahy Scale: Fair     Standing balance support: Bilateral upper extremity supported;No upper extremity supported;During functional activity Standing balance-Leahy Scale: Fair Standing balance comment: Reliant on RW for stability.                              Pertinent Vitals/Pain Pain Assessment: 0-10 Pain Score: 8  Faces Pain Scale: Hurts even more Pain Location: back  Pain Descriptors / Indicators: Aching;Operative site guarding;Sore Pain Intervention(s): Limited activity within patient's tolerance;Monitored during session;Premedicated before session    [place] expects to be discharged to:: Private residence Living Arrangements: Spouse/significant other Available Help at Discharge: Family;Available PRN/intermittently Type of Home: House Home Access: Stairs to enter Entrance Stairs-Rails: Right;Left;Can reach both Entrance Stairs-Number of Steps: 2 Home Layout: Two level;Able to live on main level with bedroom/bathroom Home Equipment: Cane - single  point;Shower seat;Grab bars -  tub/shower      Prior Function Level of Independence: Independent with assistive device(s)         Comments: Used cane for ambulation      Hand Dominance        Extremity/Trunk Assessment   Upper Extremity Assessment Upper Extremity Assessment: Overall WFL for tasks assessed    Lower Extremity Assessment Lower Extremity Assessment: Defer to PT evaluation LLE Deficits / Details: L knee buckling noted during gait.     Cervical / Trunk Assessment Cervical / Trunk Assessment: Other exceptions Cervical / Trunk Exceptions: s/p back surgery.  Communication   Communication: No difficulties  Cognition Arousal/Alertness: Awake/alert Behavior During Therapy: WFL for tasks assessed/performed Overall Cognitive Status: Within Functional Limits for tasks assessed                                        General Comments General comments (skin integrity, edema, etc.): Pt family present throughout session.     Exercises     Assessment/Plan    PT Assessment Patient needs continued PT services  PT Problem List Decreased strength;Decreased activity tolerance;Decreased balance;Decreased mobility;Decreased knowledge of use of DME;Decreased knowledge of precautions;Pain       PT Treatment Interventions DME instruction;Gait training;Stair training;Functional mobility training;Therapeutic activities;Balance training;Therapeutic exercise;Neuromuscular re-education;Patient/family education    PT Goals (Current goals can be found in the Care Plan section)  Acute Rehab PT Goals Patient Stated Goal: to have less pain PT Goal Formulation: With patient Time For Goal Achievement: 08/14/16 Potential to Achieve Goals: Good    Frequency Min 5X/week   Barriers to discharge        Co-evaluation               AM-PAC PT "6 Clicks" Daily Activity  Outcome Measure Difficulty turning over in bed (including adjusting bedclothes, sheets and blankets)?: Total Difficulty  moving from lying on back to sitting on the side of the bed? : Total Difficulty sitting down on and standing up from a chair with arms (e.g., wheelchair, bedside commode, etc,.)?: Total Help needed moving to and from a bed to chair (including a wheelchair)?: A Little Help needed walking in hospital room?: A Little Help needed climbing 3-5 steps with a railing? : A Lot 6 Click Score: 11    End of Session Equipment Utilized During Treatment: Gait belt;Back brace Activity Tolerance: Patient limited by pain Patient left: in bed;with call bell/phone within reach;with family/visitor present Nurse Communication: Mobility status;Patient requests pain meds PT Visit Diagnosis: Unsteadiness on feet (R26.81);Other abnormalities of gait and mobility (R26.89);Pain Pain - Right/Left: Left Pain - part of body: Leg (back )    Time: 1451-1511 PT Time Calculation (min) (ACUTE ONLY): 20 min   Charges:   PT Evaluation $PT Eval Low Complexity: 1 Procedure PT Treatments $Gait Training: 8-22 mins   PT G Codes:        Leighton Ruff, PT, DPT  Acute Rehabilitation Services  Pager: [phone]   Rudean Hitt 08/07/2016, 6:14 PM

## 2016-08-08 MED ORDER — METHOCARBAMOL 500 MG PO TABS: 500.0000 mg | ORAL_TABLET | Freq: Four times a day (QID) | ORAL | 1 refills | Status: DC | PRN

## 2016-08-08 MED ORDER — OXYCODONE-ACETAMINOPHEN 10-325 MG PO TABS: 1.0000 | ORAL_TABLET | ORAL | 0 refills | Status: DC | PRN

## 2016-08-08 NOTE — Progress Notes (Signed)
Patient is discharged from room 3C02 at this time. Alert and in stable condition. IV site d/c'd and instructions read to patient with understanding verbalized. Left unit via wheelchair with spouse and all belongings at side.

## 2016-08-08 NOTE — Discharge Summary (Signed)
Physician Discharge Summary  Patient ID: Benjamin Solomon MRN: [id number] DOB/AGE: 53-Dec-1967 53 y.o.  Admit date: 08/07/2016 Discharge date: 08/08/2016  Admission Diagnoses: DDD/ retrolisthesis L5-S1    Discharge Diagnoses: same   Discharged Condition: good  Hospital Course: The patient was admitted on 08/07/2016 and taken to the operating room where the patient underwent PLIF L5-S1. The patient tolerated the procedure well and was taken to the recovery room and then to the floor in stable condition. The hospital course was routine. There were no complications. The wound remained clean dry and intact. Pt had appropriate back soreness. No complaints of leg pain or new N/T/W. The patient remained afebrile with stable vital signs, and tolerated a regular diet. The patient continued to increase activities, and pain was well controlled with oral pain medications.   Consults: None  Significant Diagnostic Studies:  Results for orders placed or performed during the hospital encounter of 07/30/16  Surgical pcr screen  Result Value Ref Range   MRSA, PCR NEGATIVE NEGATIVE   Staphylococcus aureus NEGATIVE NEGATIVE  Basic metabolic panel  Result Value Ref Range   Sodium 134 (L) 135 - 145 mmol/L   Potassium 3.8 3.5 - 5.1 mmol/L   Chloride 102 101 - 111 mmol/L   CO2 25 22 - 32 mmol/L   Glucose, Bld 106 (H) 65 - 99 mg/dL   BUN 9 6 - 20 mg/dL   Creatinine, Ser 1.29 (H) 0.61 - 1.24 mg/dL   Calcium 9.6 8.9 - 10.3 mg/dL   GFR calc non Af Amer >60 >60 mL/min   GFR calc Af Amer >60 >60 mL/min   Anion gap 7 5 - 15  CBC WITH DIFFERENTIAL  Result Value Ref Range   WBC 9.8 4.0 - 10.5 K/uL   RBC 4.46 4.22 - 5.81 MIL/uL   Hemoglobin 13.7 13.0 - 17.0 g/dL   HCT 39.5 39.0 - 52.0 %   MCV 88.6 78.0 - 100.0 fL   MCH 30.7 26.0 - 34.0 pg   MCHC 34.7 30.0 - 36.0 g/dL   RDW 14.1 11.5 - 15.5 %   Platelets 257 150 - 400 K/uL   Neutrophils Relative % 54 %   Neutro Abs 5.3 1.7 - 7.7 K/uL   Lymphocytes  Relative 36 %   Lymphs Abs 3.6 0.7 - 4.0 K/uL   Monocytes Relative 7 %   Monocytes Absolute 0.6 0.1 - 1.0 K/uL   Eosinophils Relative 3 %   Eosinophils Absolute 0.3 0.0 - 0.7 K/uL   Basophils Relative 0 %   Basophils Absolute 0.0 0.0 - 0.1 K/uL  Protime-INR  Result Value Ref Range   Prothrombin Time 12.4 11.4 - 15.2 seconds   INR 0.93   Type and screen All Cardiac and thoracic surgeries, spinal fusions, myomectomies, craniotomies, colon & liver resections, total joint revisions, same day c-section with placenta previa or accreta.  Result Value Ref Range   ABO/RH(D) O POS    Antibody Screen NEG    Sample Expiration 08/13/2016    Extend sample reason NO TRANSFUSIONS OR PREGNANCY IN THE PAST 3 MONTHS   ABO/Rh  Result Value Ref Range   ABO/RH(D) O POS     Chest 2 View  Result Date: 07/30/2016 CLINICAL DATA:  Preoperative evaluation for lumbar surgery. Hypertension EXAM: CHEST  2 VIEW COMPARISON:  March 14, 2011 FINDINGS: Lungs are clear. Heart size and pulmonary vascularity are normal. No adenopathy. There is postoperative change in the lower cervical spine region. IMPRESSION: No edema or consolidation.  Electronically Signed   By: Lowella Grip III M.D.   On: 07/30/2016 14:15   Dg Lumbar Spine 2-3 Views  Result Date: 08/07/2016 CLINICAL DATA:  Lumbar disc disease. EXAM: LUMBAR SPINE - 2-3 VIEW; DG C-ARM 61-120 MIN COMPARISON:  MRI dated 05/02/2016 FINDINGS: AP and lateral C-arm images demonstrate the patient has undergone interbody and posterior fusion at L5-S1. Alignment is anatomic. IMPRESSION: Fusion performed at L5-S1. FLUOROSCOPY TIME:  1 minutes 14 seconds C-arm fluoroscopic images were obtained intraoperatively and submitted for post operative interpretation. Electronically Signed   By: Lorriane Shire M.D.   On: 08/07/2016 10:11   Dg C-arm 1-60 Min  Result Date: 08/07/2016 CLINICAL DATA:  Lumbar disc disease. EXAM: LUMBAR SPINE - 2-3 VIEW; DG C-ARM 61-120 MIN COMPARISON:   MRI dated 05/02/2016 FINDINGS: AP and lateral C-arm images demonstrate the patient has undergone interbody and posterior fusion at L5-S1. Alignment is anatomic. IMPRESSION: Fusion performed at L5-S1. FLUOROSCOPY TIME:  1 minutes 14 seconds C-arm fluoroscopic images were obtained intraoperatively and submitted for post operative interpretation. Electronically Signed   By: Lorriane Shire M.D.   On: 08/07/2016 10:11    Antibiotics:  Anti-infectives    Start     Dose/Rate Route Frequency Ordered Stop   08/07/16 1600  ceFAZolin (ANCEF) IVPB 2g/100 mL premix     2 g 200 mL/hr over 30 Minutes Intravenous Every 8 hours 08/07/16 1209 08/07/16 2352   08/07/16 1004  vancomycin (VANCOCIN) powder  Status:  Discontinued       As needed 08/07/16 1004 08/07/16 1025   08/07/16 0851  bacitracin 50,000 Units in sodium chloride irrigation 0.9 % 500 mL irrigation  Status:  Discontinued       As needed 08/07/16 0851 08/07/16 1025   08/07/16 0546  ceFAZolin (ANCEF) 2-4 GM/100ML-% IVPB    Comments:  Ernesta Amble   : cabinet override      08/07/16 0546 08/07/16 0750   08/07/16 0543  ceFAZolin (ANCEF) IVPB 2g/100 mL premix     2 g 200 mL/hr over 30 Minutes Intravenous On call to O.R. 08/07/16 0543 08/07/16 0750      Discharge Exam: Blood pressure 104/80, pulse 95, temperature 98.6 F (37 C), temperature source Oral, resp. rate 18, height 5\' 8"  (1.727 m), weight 74.4 kg (164 lb), SpO2 100 %. Neurologic: Grossly normal Dressing dry  Discharge Medications:   Allergies as of 08/08/2016   No Known Allergies     Medication List    TAKE these medications   ALPRAZolam 0.5 MG tablet Commonly known as:  XANAX Take 0.5 mg by mouth at bedtime as needed for sleep.   amLODipine 5 MG tablet Commonly known as:  NORVASC Take 5 mg by mouth daily as needed (for high blood pressure).   baclofen 10 MG tablet Commonly known as:  LIORESAL Take 10 mg by mouth 3 (three) times daily.   buPROPion 300 MG 24 hr  tablet Commonly known as:  WELLBUTRIN XL Take 300 mg by mouth daily.   clindamycin 1 % external solution Commonly known as:  CLEOCIN T Apply 1 application topically 2 (two) times daily as needed (boils).   clindamycin 300 MG capsule Commonly known as:  CLEOCIN Take 300 mg by mouth 2 (two) times daily as needed (boils).   docusate sodium 100 MG capsule Commonly known as:  COLACE Take 200 mg by mouth daily.   HYDROcodone-acetaminophen 5-325 MG tablet Commonly known as:  NORCO/VICODIN Take 1-2 tablets by mouth every 6 (six)  hours as needed.   ibuprofen 600 MG tablet Commonly known as:  ADVIL,MOTRIN Take 1 tablet (600 mg total) by mouth every 6 (six) hours as needed.   lisinopril-hydrochlorothiazide 20-12.5 MG tablet Commonly known as:  PRINZIDE,ZESTORETIC Take 1 tablet by mouth daily.   methocarbamol 500 MG tablet Commonly known as:  ROBAXIN Take 1 tablet (500 mg total) by mouth every 6 (six) hours as needed for muscle spasms.   ondansetron 4 MG tablet Commonly known as:  ZOFRAN Take 1 tablet (4 mg total) by mouth every 6 (six) hours.   oxyCODONE-acetaminophen 7.5-325 MG tablet Commonly known as:  PERCOCET Take 1 tablet by mouth every 6 (six) hours as needed for pain. What changed:  Another medication with the same name was changed. Make sure you understand how and when to take each.   oxyCODONE-acetaminophen 10-325 MG tablet Commonly known as:  PERCOCET Take 1 tablet by mouth every 4 (four) hours as needed for pain. What changed:  when to take this  reasons to take this   OXYCONTIN 20 mg 12 hr tablet Generic drug:  oxyCODONE Take 20 mg by mouth every 12 (twelve) hours.   tetrahydrozoline 0.05 % ophthalmic solution Place 2 drops into both eyes daily as needed (dry eyes).   UNABLE TO FIND Valium 10 mg suppositories-insert one daily rectally as needed for pain            Durable Medical Equipment        Start     Ordered   08/07/16 1210  DME Walker  rolling  Once    Question:  Patient needs a walker to treat with the following condition  Answer:  S/P lumbar fusion   08/07/16 1209   08/07/16 1210  DME 3 n 1  Once     08/07/16 1209      Disposition: home   Final Dx: PLIF L5-S1  Discharge Instructions    Call MD for:  difficulty breathing, headache or visual disturbances    Complete by:  As directed    Call MD for:  persistant nausea and vomiting    Complete by:  As directed    Call MD for:  redness, tenderness, or signs of infection (pain, swelling, redness, odor or green/yellow discharge around incision site)    Complete by:  As directed    Call MD for:  severe uncontrolled pain    Complete by:  As directed    Call MD for:  temperature >100.4    Complete by:  As directed    Diet - low sodium heart healthy    Complete by:  As directed    Increase activity slowly    Complete by:  As directed          Signed: JONES,Erez S 08/08/2016, 1:48 PM

## 2016-08-08 NOTE — Care Management Note (Signed)
Case Management Note Marvetta Gibbons RN, BSN Unit 4E-Case Manager-- [place] coverage [phone]  Patient Details  Name: Benjamin Solomon MRN: [id number] Date of Birth: 19-Jan-1965  Subjective/Objective:  Pt admitted s/p lumbar spinal fusion                  Action/Plan: PTA pt lived at home- per PT/OT evals- no f/u recommended- DME ordered- 3n1 and RW- notified [facility] with [facility] for DME needs- equipment to be delivered to room prior to discharge  Expected Discharge Date:  08/08/16               Expected Discharge Plan:  Home/Self Care  In-House Referral:     Discharge planning Services  CM Consult  Post Acute Care Choice:  Durable Medical Equipment Choice offered to:  NA  DME Arranged:  3-N-1, Walker rolling DME Agency:  [place] Arranged:  NA [place] Agency:  NA  Status of Service:  Completed, signed off  If discussed at [place] of Stay Meetings, dates discussed:    Discharge Disposition: home/self care   Additional Comments:  [name], RN 08/08/2016, 2:22 PM

## 2016-08-08 NOTE — Anesthesia Postprocedure Evaluation (Signed)
Anesthesia Post Note  Patient: Benjamin Solomon  Procedure(s) Performed: Procedure(s) (LRB): Posterior Lateral Interbody Fusion - Lumbar five-Sacral one (N/A)     Patient location during evaluation: PACU Anesthesia Type: General Level of consciousness: awake and alert and patient cooperative Pain management: pain level controlled Vital Signs Assessment: post-procedure vital signs reviewed and stable Respiratory status: spontaneous breathing and respiratory function stable Cardiovascular status: stable Anesthetic complications: no    Last Vitals:  Vitals:   08/07/16 2308 08/08/16 0320  BP: 106/68 109/71  Pulse: 94 88  Resp: 18 17  Temp: 37.2 C 37.1 C    Last Pain:  Vitals:   08/08/16 0730  TempSrc:   PainSc: [place]

## 2016-08-08 NOTE — Discharge Instructions (Signed)

## 2016-08-08 NOTE — Progress Notes (Signed)
Physical Therapy Treatment Patient Details Name: Benjamin Solomon MRN: [id number] DOB: [birth year] Today's Date: 08/08/2016    History of Present Illness Pt is 51 y/o male s/p L5-S1 PLIF. PMH includes anxiety, depression, HTN, R foot surgery, and cervical spine surgery.     PT Comments    Pt progressing well towards physical therapy goals. Was able to perform transfers and ambulation with min guard to supervision with safety. Continues to demonstrate poor posture and LE weakness. Will continue to follow and progress as able per PT POC.   Follow Up Recommendations  No PT follow up;Supervision for mobility/OOB     Equipment Recommendations  Rolling walker with 5" wheels;3in1 (PT)    Recommendations for Other Services       Precautions / Restrictions Precautions Precautions: Back Precaution Booklet Issued: Yes (comment) Precaution Comments: Reviewed back precaution handout with pt.  Required Braces or Orthoses: Spinal Brace Spinal Brace: Lumbar corset;Applied in standing position Restrictions Weight Bearing Restrictions: No    Mobility  Bed Mobility               General bed mobility comments: Pt sitting up on EOB when PT arrived.   Transfers Overall transfer level: Needs assistance Equipment used: Rolling walker (2 wheeled) Transfers: Sit to/from Stand Sit to Stand: Min guard         General transfer comment: Close guard for safety as pt powered up to full standing position. VC's for hand placement on seated surface for safety and to wait for walker prior to initiating transfer  Ambulation/Gait Ambulation/Gait assistance: Supervision Ambulation Distance (Feet): 300 Feet Assistive device: Rolling walker (2 wheeled) Gait Pattern/deviations: Step-through pattern;Decreased stride length;Trunk flexed Gait velocity: Decreased Gait velocity interpretation: Below normal speed for age/gender General Gait Details: Noted bilaterally flexed knees, with flexed trunk and  elevated shoulders. Able to correct with cues.   Stairs Stairs: Yes   Stair Management: One rail Right;Step to pattern;Forwards Number of Stairs: 2 General stair comments: VC's for sequencing and general safety. No assist required but hands on for balance  Wheelchair Mobility    Modified Rankin (Stroke Patients Only)       Balance Overall balance assessment: Needs assistance Sitting-balance support: No upper extremity supported;Feet supported Sitting balance-Leahy Scale: Good     Standing balance support: Bilateral upper extremity supported;No upper extremity supported;During functional activity Standing balance-Leahy Scale: Fair Standing balance comment: Reliant on RW for stability.                             Cognition Arousal/Alertness: Awake/alert Behavior During Therapy: WFL for tasks assessed/performed Overall Cognitive Status: Within Functional Limits for tasks assessed                                        Exercises      General Comments        Pertinent Vitals/Pain Pain Assessment: 0-10 Pain Score: 7  Pain Location: back  Pain Descriptors / Indicators: Aching;Operative site guarding;Sore Pain Intervention(s): Monitored during session;Limited activity within patient's tolerance;Repositioned    Home Living Family/patient expects to be discharged to:: Private residence Living Arrangements: Spouse/significant other                  Prior Function            PT Goals (current goals can now be found  in the care plan section) Acute Rehab PT Goals Patient Stated Goal: to have less pain PT Goal Formulation: With patient Time For Goal Achievement: 08/14/16 Potential to Achieve Goals: Good Progress towards PT goals: Progressing toward goals    Frequency    Min 5X/week      PT Plan Current plan remains appropriate    Co-evaluation              AM-PAC PT "6 Clicks" Daily Activity  Outcome Measure   Difficulty turning over in bed (including adjusting bedclothes, sheets and blankets)?: Total Difficulty moving from lying on back to sitting on the side of the bed? : Total Difficulty sitting down on and standing up from a chair with arms (e.g., wheelchair, bedside commode, etc,.)?: Total Help needed moving to and from a bed to chair (including a wheelchair)?: A Little Help needed walking in hospital room?: A Little Help needed climbing 3-5 steps with a railing? : A Lot 6 Click Score: 11    End of Session Equipment Utilized During Treatment: Gait belt;Back brace Activity Tolerance: Patient limited by pain Patient left: in bed;with call bell/phone within reach;with family/visitor present Nurse Communication: Mobility status;Patient requests pain meds PT Visit Diagnosis: Unsteadiness on feet (R26.81);Other abnormalities of gait and mobility (R26.89);Pain Pain - part of body:  (back)     Time: 0800-0820 PT Time Calculation (min) (ACUTE ONLY): 20 min  Charges:  $Gait Training: 8-22 mins                    G Codes:       Rolinda Roan, PT, DPT Acute Rehabilitation Services Pager: [phone]    Thelma Comp 08/08/2016, 9:43 AM

## 2016-08-08 NOTE — Progress Notes (Signed)
Occupational Therapy Treatment Patient Details Name: Benjamin Solomon MRN: [id number] DOB: 1965/11/28 Today's Date: 08/08/2016    History of present illness Pt is 51 y/o male s/p L5-S1 PLIF. PMH includes anxiety, depression, HTN, R foot surgery, and cervical spine surgery.    OT comments  Pt able to perform functional mobility and shower transfer with supervision. Educated pt on use of AE and pt able to return demo use. D/c plan remains appropriate. Will continue to follow acutely.   Follow Up Recommendations  No OT follow up;Supervision/Assistance - 24 hour (initially)    Equipment Recommendations  3 in 1 bedside commode;Other (comment) (Adaptive equipment)    Recommendations for Other Services      Precautions / Restrictions Precautions Precautions: Back Precaution Booklet Issued: No Precaution Comments: Pt able to recall 3/3 back precautions Required Braces or Orthoses: Spinal Brace Spinal Brace: Lumbar corset;Applied in standing position Restrictions Weight Bearing Restrictions: No       Mobility Bed Mobility               General bed mobility comments: Pt OOB with PT upon arrival  Transfers Overall transfer level: Needs assistance Equipment used: Rolling walker (2 wheeled) Transfers: Sit to/from Stand Sit to Stand: Min guard         General transfer comment: for safety. good hand placement and technique    Balance Overall balance assessment: Needs assistance Sitting-balance support: Feet supported;No upper extremity supported Sitting balance-Leahy Scale: Good     Standing balance support: Bilateral upper extremity supported;During functional activity Standing balance-Leahy Scale: Fair Standing balance comment: Reliant on RW for stability.                            ADL either performed or assessed with clinical judgement   ADL Overall ADL's : Needs assistance/impaired               Lower Body Bathing Details (indicate cue type  and reason): Educated on use of long handled sponge     Lower Body Dressing: Min guard;Sit to/from stand Lower Body Dressing Details (indicate cue type and reason): Educated on use of reacher, sock aide, and long handled shoe horn-pt able to return demo use of sock aide and Investment banker, operational Transfer: Supervision/safety;Ambulation;RW Toilet Transfer Details (indicate cue type and reason): Simulated   Toileting - Clothing Manipulation Details (indicate cue type and reason): Educated on proper technique for peri care without twisting and use of wet wipes. Educated on use of toilet tongs if having difficulty with peri care upon return home Tub/ Shower Transfer: Supervision/safety;Walk-in shower;Ambulation;3 in 1;Rolling walker Tub/Shower Transfer Details (indicate cue type and reason): Educated pt on walk in shower transfer technique; pt able to return demo with supervision for safety. Functional mobility during ADLs: Supervision/safety;Rolling walker       Vision       Perception     Praxis      Cognition Arousal/Alertness: Awake/alert Behavior During Therapy: WFL for tasks assessed/performed Overall Cognitive Status: Within Functional Limits for tasks assessed                                          Exercises     Shoulder Instructions       General Comments      Pertinent Vitals/ Pain       Pain Assessment:  0-10 Pain Score: 7  Pain Location: back Pain Descriptors / Indicators: Aching;Sore Pain Intervention(s): Monitored during session;Repositioned  Home Living Family/patient expects to be discharged to:: Private residence Living Arrangements: Spouse/significant other                                      Prior Functioning/Environment              Frequency  Min 2X/week        Progress Toward Goals  OT Goals(current goals can now be found in the care plan section)  Progress towards OT goals: Progressing toward  goals  Acute Rehab OT Goals Patient Stated Goal: to have less pain OT Goal Formulation: With patient  Plan Discharge plan remains appropriate    Co-evaluation                 AM-PAC PT "6 Clicks" Daily Activity     Outcome Measure   Help from another person eating meals?: None Help from another person taking care of personal grooming?: None Help from another person toileting, which includes using toliet, bedpan, or urinal?: A Little Help from another person bathing (including washing, rinsing, drying)?: A Little Help from another person to put on and taking off regular upper body clothing?: None Help from another person to put on and taking off regular lower body clothing?: A Little 6 Click Score: 21    End of Session Equipment Utilized During Treatment: Rolling walker;Back brace;Other (comment) (AE)  OT Visit Diagnosis: Unsteadiness on feet (R26.81);Other abnormalities of gait and mobility (R26.89);Pain Pain - part of body:  (back)   Activity Tolerance Patient tolerated treatment well   Patient Left in chair;with call bell/phone within reach;with family/visitor present   Nurse Communication Mobility status        Time: 6568-1275 OT Time Calculation (min): 20 min  Charges: OT General Charges $OT Visit: 1 Procedure OT Treatments $Self Care/Home Management : 8-22 mins  Ayianna Darnold A. Ulice Brilliant, M.S., OTR/L Pager: [place] 08/08/2016, 9:54 AM

## 2016-08-26 ENCOUNTER — Ambulatory Visit: Payer: BLUE CROSS/BLUE SHIELD | Admitting: Psychology

## 2016-10-21 ENCOUNTER — Ambulatory Visit (INDEPENDENT_AMBULATORY_CARE_PROVIDER_SITE_OTHER): Payer: BLUE CROSS/BLUE SHIELD | Admitting: Psychology

## 2016-10-21 DIAGNOSIS — F331 Major depressive disorder, recurrent, moderate: Secondary | ICD-10-CM

## 2016-12-02 ENCOUNTER — Ambulatory Visit (INDEPENDENT_AMBULATORY_CARE_PROVIDER_SITE_OTHER): Payer: BLUE CROSS/BLUE SHIELD | Admitting: Psychology

## 2016-12-02 DIAGNOSIS — F331 Major depressive disorder, recurrent, moderate: Secondary | ICD-10-CM | POA: Diagnosis not present

## 2017-01-17 ENCOUNTER — Ambulatory Visit: Payer: Self-pay | Admitting: Psychology

## 2018-01-01 ENCOUNTER — Other Ambulatory Visit: Payer: Self-pay | Admitting: Orthopedic Surgery

## 2018-02-10 ENCOUNTER — Other Ambulatory Visit: Payer: Self-pay

## 2018-02-10 ENCOUNTER — Encounter (HOSPITAL_BASED_OUTPATIENT_CLINIC_OR_DEPARTMENT_OTHER): Payer: Self-pay | Admitting: *Deleted

## 2018-02-13 ENCOUNTER — Encounter (HOSPITAL_BASED_OUTPATIENT_CLINIC_OR_DEPARTMENT_OTHER)
Admission: RE | Admit: 2018-02-13 | Discharge: 2018-02-13 | Disposition: A | Discharge status: Home / Self Care | Payer: Medicare HMO | Source: Ambulatory Visit | Attending: Orthopedic Surgery | Admitting: Orthopedic Surgery

## 2018-02-13 DIAGNOSIS — Z01818 Encounter for other preprocedural examination: Secondary | ICD-10-CM | POA: Diagnosis not present

## 2018-02-13 LAB — BASIC METABOLIC PANEL
Anion gap: 7 (ref 5–15)
BUN: 9 mg/dL (ref 6–20)
CO2: 29 mmol/L (ref 22–32)
Calcium: 9.1 mg/dL (ref 8.9–10.3)
Chloride: 104 mmol/L (ref 98–111)
Creatinine, Ser: 1.22 mg/dL (ref 0.61–1.24)
GFR calc Af Amer: >60 mL/min (ref >60)
Glucose, Bld: 106 mg/dL — ABNORMAL HIGH (ref 70–99)
POTASSIUM: 4.1 mmol/L (ref 3.5–5.1)
Sodium: 140 mmol/L (ref 135–145)

## 2018-02-16 NOTE — Anesthesia Preprocedure Evaluation (Addendum)
Anesthesia Evaluation  Patient identified by MRN, date of birth, ID band Patient awake    Reviewed: Allergy & Precautions, NPO status , Patient's Chart, lab work & pertinent test results  History of Anesthesia Complications Negative for: history of anesthetic complications  Airway Mallampati: II  TM Distance: >3 FB Neck ROM: Full    Dental  (+) Teeth Intact   Pulmonary Current Smoker,    Pulmonary exam normal breath sounds clear to auscultation       Cardiovascular hypertension, Pt. on medications Normal cardiovascular exam Rhythm:Regular Rate:Normal     Neuro/Psych Anxiety Depression negative neurological ROS     GI/Hepatic negative GI ROS, (+)     substance abuse  ,   Endo/Other  negative endocrine ROS  Renal/GU negative Renal ROS     Musculoskeletal  (+) Arthritis , narcotic dependent  Abdominal   Peds  Hematology negative hematology ROS (+)   Anesthesia Other Findings Day of surgery medications reviewed with the patient.  Reproductive/Obstetrics                            Anesthesia Physical Anesthesia Plan  ASA: II  Anesthesia Plan: Bier Block and Bier Block-LIDOCAINE ONLY   Post-op Pain Management:    Induction:   PONV Risk Score and Plan: Treatment may vary due to age or medical condition and Propofol infusion  Airway Management Planned: Simple Face Mask and Natural Airway  Additional Equipment:   Intra-op Plan:   Post-operative Plan:   Informed Consent: I have reviewed the patients History and Physical, chart, labs and discussed the procedure including the risks, benefits and alternatives for the proposed anesthesia with the patient or authorized representative who has indicated his/her understanding and acceptance.       Plan Discussed with: CRNA  Anesthesia Plan Comments:        Anesthesia Quick Evaluation

## 2018-02-17 ENCOUNTER — Other Ambulatory Visit: Payer: Self-pay

## 2018-02-17 ENCOUNTER — Ambulatory Visit (HOSPITAL_BASED_OUTPATIENT_CLINIC_OR_DEPARTMENT_OTHER): Payer: Medicare HMO | Admitting: Anesthesiology

## 2018-02-17 ENCOUNTER — Ambulatory Visit (HOSPITAL_BASED_OUTPATIENT_CLINIC_OR_DEPARTMENT_OTHER)
Admission: RE | Admit: 2018-02-17 | Discharge: 2018-02-17 | Disposition: A | Discharge status: Home / Self Care | Payer: Medicare HMO | Attending: Orthopedic Surgery | Admitting: Orthopedic Surgery

## 2018-02-17 ENCOUNTER — Encounter (HOSPITAL_BASED_OUTPATIENT_CLINIC_OR_DEPARTMENT_OTHER): Payer: Self-pay | Admitting: *Deleted

## 2018-02-17 ENCOUNTER — Encounter (HOSPITAL_BASED_OUTPATIENT_CLINIC_OR_DEPARTMENT_OTHER)
Admission: RE | Disposition: A | Discharge status: Home / Self Care | Payer: Self-pay | Source: Home / Self Care | Attending: Orthopedic Surgery

## 2018-02-17 DIAGNOSIS — Z79899 Other long term (current) drug therapy: Secondary | ICD-10-CM | POA: Diagnosis not present

## 2018-02-17 DIAGNOSIS — F419 Anxiety disorder, unspecified: Secondary | ICD-10-CM | POA: Diagnosis not present

## 2018-02-17 DIAGNOSIS — F1120 Opioid dependence, uncomplicated: Secondary | ICD-10-CM | POA: Diagnosis not present

## 2018-02-17 DIAGNOSIS — F329 Major depressive disorder, single episode, unspecified: Secondary | ICD-10-CM | POA: Insufficient documentation

## 2018-02-17 DIAGNOSIS — G5603 Carpal tunnel syndrome, bilateral upper limbs: Secondary | ICD-10-CM | POA: Diagnosis present

## 2018-02-17 DIAGNOSIS — M47812 Spondylosis without myelopathy or radiculopathy, cervical region: Secondary | ICD-10-CM | POA: Insufficient documentation

## 2018-02-17 DIAGNOSIS — G5601 Carpal tunnel syndrome, right upper limb: Secondary | ICD-10-CM | POA: Insufficient documentation

## 2018-02-17 DIAGNOSIS — F17210 Nicotine dependence, cigarettes, uncomplicated: Secondary | ICD-10-CM | POA: Insufficient documentation

## 2018-02-17 DIAGNOSIS — I10 Essential (primary) hypertension: Secondary | ICD-10-CM | POA: Insufficient documentation

## 2018-02-17 HISTORY — DX: Other chronic pain: G89.29

## 2018-02-17 HISTORY — DX: Opioid dependence, uncomplicated: F11.20

## 2018-02-17 HISTORY — PX: CARPAL TUNNEL RELEASE: SHX101

## 2018-02-17 HISTORY — DX: Dorsalgia, unspecified: M54.9

## 2018-02-17 SURGERY — CARPAL TUNNEL RELEASE
Anesthesia: Regional | Site: Wrist | Laterality: Right

## 2018-02-17 MED ORDER — MIDAZOLAM HCL 5 MG/5ML IJ SOLN
INTRAMUSCULAR | Status: DC | PRN
  Administered 2018-02-17: 2 mg via INTRAVENOUS

## 2018-02-17 MED ORDER — OXYCODONE HCL 5 MG PO TABS: 5.0000 mg | ORAL_TABLET | Freq: Once | ORAL | Status: DC | PRN

## 2018-02-17 MED ORDER — PROPOFOL 500 MG/50ML IV EMUL
INTRAVENOUS | Status: DC | PRN
  Administered 2018-02-17: 125 ug/kg/min via INTRAVENOUS

## 2018-02-17 MED ORDER — FENTANYL CITRATE (PF) 100 MCG/2ML IJ SOLN: 50.0000 ug | INTRAMUSCULAR | Status: DC | PRN

## 2018-02-17 MED ORDER — LACTATED RINGERS IV SOLN
INTRAVENOUS | Status: DC
  Administered 2018-02-17: 08:00:00 via INTRAVENOUS

## 2018-02-17 MED ORDER — SCOPOLAMINE 1 MG/3DAYS TD PT72: 1.0000 | MEDICATED_PATCH | Freq: Once | TRANSDERMAL | Status: DC | PRN

## 2018-02-17 MED ORDER — BUPIVACAINE HCL (PF) 0.25 % IJ SOLN
INTRAMUSCULAR | Status: DC | PRN
  Administered 2018-02-17: 9 mL

## 2018-02-17 MED ORDER — TRAMADOL HCL 50 MG PO TABS: 50.0000 mg | ORAL_TABLET | Freq: Four times a day (QID) | ORAL | 0 refills | Status: DC | PRN

## 2018-02-17 MED ORDER — DEXMEDETOMIDINE HCL IN NACL 200 MCG/50ML IV SOLN
INTRAVENOUS | Status: AC
  Filled 2018-02-17: qty 50

## 2018-02-17 MED ORDER — OXYCODONE HCL 5 MG/5ML PO SOLN: 5.0000 mg | Freq: Once | ORAL | Status: DC | PRN

## 2018-02-17 MED ORDER — CEFAZOLIN SODIUM-DEXTROSE 2-4 GM/100ML-% IV SOLN
2.0000 g | INTRAVENOUS | Status: AC
  Administered 2018-02-17: 2 g via INTRAVENOUS

## 2018-02-17 MED ORDER — MIDAZOLAM HCL 2 MG/2ML IJ SOLN
INTRAMUSCULAR | Status: AC
  Filled 2018-02-17: qty 2

## 2018-02-17 MED ORDER — FENTANYL CITRATE (PF) 100 MCG/2ML IJ SOLN
INTRAMUSCULAR | Status: DC | PRN
  Administered 2018-02-17 (×2): 50 ug via INTRAVENOUS

## 2018-02-17 MED ORDER — LIDOCAINE 2% (20 MG/ML) 5 ML SYRINGE
INTRAMUSCULAR | Status: AC
  Filled 2018-02-17: qty 5

## 2018-02-17 MED ORDER — LIDOCAINE HCL (PF) 0.5 % IJ SOLN
INTRAMUSCULAR | Status: DC | PRN
  Administered 2018-02-17: 35 mL via INTRAVENOUS

## 2018-02-17 MED ORDER — DEXMEDETOMIDINE HCL IN NACL 200 MCG/50ML IV SOLN
INTRAVENOUS | Status: DC | PRN
  Administered 2018-02-17: 12 ug via INTRAVENOUS

## 2018-02-17 MED ORDER — PROPOFOL 10 MG/ML IV BOLUS
INTRAVENOUS | Status: DC | PRN
  Administered 2018-02-17: 20 mg via INTRAVENOUS

## 2018-02-17 MED ORDER — CHLORHEXIDINE GLUCONATE 4 % EX LIQD: 60.0000 mL | Freq: Once | CUTANEOUS | Status: DC

## 2018-02-17 MED ORDER — ONDANSETRON HCL 4 MG/2ML IJ SOLN
INTRAMUSCULAR | Status: DC | PRN
  Administered 2018-02-17: 4 mg via INTRAVENOUS

## 2018-02-17 MED ORDER — CEFAZOLIN SODIUM-DEXTROSE 2-4 GM/100ML-% IV SOLN
INTRAVENOUS | Status: AC
  Filled 2018-02-17: qty 100

## 2018-02-17 MED ORDER — MIDAZOLAM HCL 2 MG/2ML IJ SOLN: 1.0000 mg | INTRAMUSCULAR | Status: DC | PRN

## 2018-02-17 MED ORDER — PROMETHAZINE HCL 25 MG/ML IJ SOLN: 6.2500 mg | INTRAMUSCULAR | Status: DC | PRN

## 2018-02-17 MED ORDER — ACETAMINOPHEN 10 MG/ML IV SOLN: 1000.0000 mg | Freq: Once | INTRAVENOUS | Status: DC | PRN

## 2018-02-17 MED ORDER — FENTANYL CITRATE (PF) 100 MCG/2ML IJ SOLN: 25.0000 ug | INTRAMUSCULAR | Status: DC | PRN

## 2018-02-17 MED ORDER — PROPOFOL 500 MG/50ML IV EMUL
INTRAVENOUS | Status: AC
  Filled 2018-02-17: qty 50

## 2018-02-17 MED ORDER — FENTANYL CITRATE (PF) 100 MCG/2ML IJ SOLN
INTRAMUSCULAR | Status: AC
  Filled 2018-02-17: qty 2

## 2018-02-17 MED ORDER — ONDANSETRON HCL 4 MG/2ML IJ SOLN
INTRAMUSCULAR | Status: AC
  Filled 2018-02-17: qty 2

## 2018-02-17 SURGICAL SUPPLY — 37 items
BLADE SURG 15 STRL LF DISP TIS (BLADE) ×1 IMPLANT
BLADE SURG 15 STRL SS (BLADE) ×2
BNDG CMPR 9X4 STRL LF SNTH (GAUZE/BANDAGES/DRESSINGS)
BNDG COHESIVE 3X5 TAN STRL LF (GAUZE/BANDAGES/DRESSINGS) ×2 IMPLANT
BNDG ESMARK 4X9 LF (GAUZE/BANDAGES/DRESSINGS) IMPLANT
BNDG GAUZE ELAST 4 BULKY (GAUZE/BANDAGES/DRESSINGS) ×2 IMPLANT
CHLORAPREP W/TINT 26ML (MISCELLANEOUS) ×2 IMPLANT
CORD BIPOLAR FORCEPS 12FT (ELECTRODE) ×2 IMPLANT
COVER BACK TABLE 60X90IN (DRAPES) ×2 IMPLANT
COVER MAYO STAND STRL (DRAPES) ×2 IMPLANT
COVER WAND RF STERILE (DRAPES) IMPLANT
CUFF TOURNIQUET SINGLE 18IN (TOURNIQUET CUFF) ×2 IMPLANT
DRAPE EXTREMITY T 121X128X90 (DISPOSABLE) ×2 IMPLANT
DRAPE SURG 17X23 STRL (DRAPES) ×2 IMPLANT
DRSG PAD ABDOMINAL 8X10 ST (GAUZE/BANDAGES/DRESSINGS) ×2 IMPLANT
GAUZE SPONGE 4X4 12PLY STRL (GAUZE/BANDAGES/DRESSINGS) ×2 IMPLANT
GAUZE XEROFORM 1X8 LF (GAUZE/BANDAGES/DRESSINGS) ×2 IMPLANT
GLOVE BIO SURGEON STRL SZ 6.5 (GLOVE) ×2 IMPLANT
GLOVE BIOGEL PI IND STRL 7.0 (GLOVE) IMPLANT
GLOVE BIOGEL PI IND STRL 8.5 (GLOVE) ×1 IMPLANT
GLOVE BIOGEL PI INDICATOR 7.0 (GLOVE) ×3
GLOVE BIOGEL PI INDICATOR 8.5 (GLOVE) ×1
GLOVE SURG ORTHO 8.0 STRL STRW (GLOVE) ×2 IMPLANT
GOWN STRL REUS W/ TWL LRG LVL3 (GOWN DISPOSABLE) ×1 IMPLANT
GOWN STRL REUS W/TWL LRG LVL3 (GOWN DISPOSABLE) ×4
GOWN STRL REUS W/TWL XL LVL3 (GOWN DISPOSABLE) ×2 IMPLANT
NDL PRECISIONGLIDE 27X1.5 (NEEDLE) IMPLANT
NEEDLE PRECISIONGLIDE 27X1.5 (NEEDLE) IMPLANT
NS IRRIG 1000ML POUR BTL (IV SOLUTION) ×2 IMPLANT
PACK BASIN DAY SURGERY FS (CUSTOM PROCEDURE TRAY) ×2 IMPLANT
STOCKINETTE 4X48 STRL (DRAPES) ×2 IMPLANT
SUT ETHILON 4 0 PS 2 18 (SUTURE) ×2 IMPLANT
SUT VICRYL 4-0 PS2 18IN ABS (SUTURE) IMPLANT
SYR BULB 3OZ (MISCELLANEOUS) ×2 IMPLANT
SYR CONTROL 10ML LL (SYRINGE) IMPLANT
TOWEL GREEN STERILE FF (TOWEL DISPOSABLE) ×2 IMPLANT
UNDERPAD 30X30 (UNDERPADS AND DIAPERS) ×2 IMPLANT

## 2018-02-17 NOTE — Transfer of Care (Signed)
Immediate Anesthesia Transfer of Care Note  Patient: Benjamin Solomon  Procedure(s) Performed: CARPAL TUNNEL RELEASE (Right Wrist)  Patient Location: PACU  Anesthesia Type:MAC and Bier block  Level of Consciousness: drowsy and patient cooperative  Airway & Oxygen Therapy: Patient Spontanous Breathing and Patient connected to face mask oxygen  Post-op Assessment: Report given to RN and Post -op Vital signs reviewed and stable  Post vital signs: Reviewed and stable  Last Vitals:  Vitals Value Taken Time  BP 108/64   Temp    Pulse 75 02/17/2018 10:01 AM  Resp 16 02/17/2018 10:01 AM  SpO2 100 % 02/17/2018 10:01 AM    Last Pain:  Vitals:   02/17/18 0821  TempSrc: Oral  PainSc: 4       Patients Stated Pain Goal: 4 (97/74/14 2395)  Complications: No apparent anesthesia complications

## 2018-02-17 NOTE — Op Note (Signed)
NAME: Benjamin Solomon MEDICAL RECORD NO: [id number] DATE OF BIRTH: 11/06/1965 FACILITY: [name] LOCATION:  SURGERY CENTER PHYSICIAN: Wynonia Sours, MD   OPERATIVE REPORT   DATE OF PROCEDURE: 02/17/18    PREOPERATIVE DIAGNOSIS:   Carpal tunnel syndrome right hand   POSTOPERATIVE DIAGNOSIS:   Same   PROCEDURE:   Compression median nerve right hand   SURGEON: Daryll Brod, M.D.   ASSISTANT: none   ANESTHESIA:  Bier block with sedation and Local   INTRAVENOUS FLUIDS:  Per anesthesia flow sheet.   ESTIMATED BLOOD LOSS:  Minimal.   COMPLICATIONS:  None.   SPECIMENS:  none   TOURNIQUET TIME:    Total Tourniquet Time Documented: Forearm (Right) - 19 minutes Total: Forearm (Right) - 19 minutes    DISPOSITION:  Stable to PACU.   INDICATIONS: Patient is a 53 year old male with a history of numbness and tingling both hands.  Nerve conductions revealed carpal tunnel syndrome bilaterally.  He also has cervical spondylosis and may well have a double crush.  He has had injections which have given him incomplete relief of symptoms with some relief of symptoms.  He has elected undergo surgical decompression of the median nerve at the wrist and see if this will make his problem tolerable.  Pre-peri-and postoperative course been discussed along with risks and complications.  He is aware that there is no guarantee to the surgery the possibility of infection recurrence injury to arteries nerves tendons incomplete relief of symptoms dystrophy the fact that we are attempting to halt the process allowing the nerve the opportunity to get better.  In the preoperative area the patient is seen the extremity marked by both patient and surgeon antibiotic given  OPERATIVE COURSE: Patient is brought to the operating room where form based IV regional anesthetic was carried out without difficulty under the direction of the anesthesia department.  He was prepped using ChloraPrep in a supine position  with the right arm free.  3-minute dry time was allowed and a timeout taken confirming patient procedure.  A longitudinal incision was made in the right palm carried down through subcutaneous tissue.  Bleeders were electrocauterized with bipolar.  The palmar fascia was split.  The superficial palmar arch was identified along with the flexor tendon to the ring and little finger.  Retractors were placed retracting median nerve radially ulnar nerve ulnarly.  The flexor retinaculum was then released on its ulnar border.  A right angle and stool retractor was placed between skin and forearm fascia proximally.  Deep structures were dissected free.  Under direct vision the proximal aspect of the flexor retinaculum and distal forearm fascia was then released under direct vision.  The canal was explored.  An area compression of the nerve was apparent.  The persistent median artery was present.  The motor branch entered in the muscle distally.  The wound was copiously irrigated with saline.  The skin was then closed interrupted 4-0 nylon sutures.  Local infiltration quarter percent bupivacaine without epinephrine was given.  Approximately 9 cc was used.  A sterile compressive dressing with the fingers free was applied.  And deflation the tourniquet all fingers immediately pink.  He was taken to the recovery room for observation in satisfactory condition.  He will be discharged home to return to the [facility] of [place].  Will be in 1 week.  Will be discharged on Tylenol ibuprofen with Ultram as a backup.   Daryll Brod, MD Electronically signed, 02/17/18

## 2018-02-17 NOTE — H&P (Signed)
Benjamin Solomon is an 53 y.o. male.   Chief Complaint:numbness right hand HPI: Benjamin Solomon is a 53 year old right-hand-dominant male referred from urgent pain management for consultation regarding pain in his right greater than left hand he states that his virtually constant on his right side intermittent on his left he still complains of sharp pain with a VAS score 8/10 weeks complaining of numbness and tingling of all of his fingers. He does not localize the pain to any extent greater than going up to his elbow. He is awakened 7 out of 7 nights. Is no history of injury to his hand. He has had his cervical spine fusion but not done by Dr. Sherley Bounds. He has been wearing a brace which has helped. Has no history of diabetes thyroid problems arthritis or gout. Family history is negative for each of these also. He states problem has been going on for approximately 2 years. He has had nerve conductions done by Dr. Vira Blanco This is right wrist changes with an increase between median and ulnar nerves no discrete significant changes on the nerve conduction numbers themselves.Has had that injected which gave him some relief of symptoms but not complete. He does have problems in his neck.    Past Medical History:  Diagnosis Date  . Anxiety   . Arthritis   . Chronic back pain   . Depression   . Hemorrhoids   . Hyperplastic colon polyp   . Hypertension   . Opioid dependence ([place])   . Tubular adenoma     Past Surgical History:  Procedure Laterality Date  . CERVICAL SPINE SURGERY  2009  . COLONOSCOPY    . EVALUATION UNDER ANESTHESIA WITH HEMORRHOIDECTOMY  12/20/2011  . FOOT SURGERY Right 2012   bone removed from the 5 th toe    Family History  Problem Relation Age of Onset  . Hypertension Mother   . Hypertension Father   . Alcohol abuse Brother   . Drug abuse Brother   . Diabetes Neg Hx   . Heart disease Neg Hx   . Kidney disease Neg Hx   . Liver disease Neg Hx    Social History:   reports that he has been smoking cigarettes. He has a 5.00 pack-year smoking history. He has never used smokeless tobacco. He reports that he does not drink alcohol or use drugs.  Allergies: No Known Allergies  No medications prior to admission.    No results found for this or any previous visit (from the past 48 hour(s)).  No results found.   Pertinent items are noted in HPI.  Height 5\' 8"  (1.727 m), weight 72.6 kg.  General appearance: alert, cooperative and appears stated age Head: Normocephalic, without obvious abnormality Neck: no JVD Resp: clear to auscultation bilaterally Cardio: regular rate and rhythm, S1, S2 normal, no murmur, click, rub or gallop GI: soft, non-tender; bowel sounds normal; no masses,  no organomegaly Extremities: numbness right hand Pulses: 2+ and symmetric Skin: Skin color, texture, turgor normal. No rashes or lesions Neurologic: Grossly normal Incision/Wound: na  Assessment/Plan Assessment:  1. Bilateral carpal tunnel syndrome    Plan: He would like to proceed to have a right carpal tunnel release done. Pre-peri-and postoperative course are discussed along with risk complications. He is aware there is no guarantee to the surgery the possibility of infection recurrence injury to arteries nerves tendons complete relief symptoms dystrophy. Is scheduled for right carpal tunnel release in outpatient under regional anesthesia.   Daryll Brod  02/17/2018, 4:02 AM

## 2018-02-17 NOTE — Anesthesia Postprocedure Evaluation (Signed)
Anesthesia Post Note  Patient: Benjamin Solomon  Procedure(s) Performed: CARPAL TUNNEL RELEASE (Right Wrist)     Patient location during evaluation: PACU Anesthesia Type: Bier Block Level of consciousness: awake and alert Pain management: pain level controlled Vital Signs Assessment: post-procedure vital signs reviewed and stable Respiratory status: spontaneous breathing, nonlabored ventilation and respiratory function stable Cardiovascular status: blood pressure returned to baseline and stable Postop Assessment: no apparent nausea or vomiting Anesthetic complications: no    Last Vitals:  Vitals:   02/17/18 1005 02/17/18 1010  BP:    Pulse: 74 75  Resp: 15 14  Temp:    SpO2: 100% 100%    Last Pain:  Vitals:   02/17/18 1002  TempSrc:   PainSc: 0-No pain                 Brennan Bailey

## 2018-02-17 NOTE — Discharge Instructions (Addendum)

## 2018-02-17 NOTE — Brief Op Note (Signed)
02/17/2018  9:55 AM  PATIENT:  Benjamin Solomon  53 y.o. male  PRE-OPERATIVE DIAGNOSIS:  CARPAL TUNNEL SYNDROME RIGHT  POST-OPERATIVE DIAGNOSIS:  CARPAL TUNNEL SYNDROME RIGHT  PROCEDURE:  Procedure(s): CARPAL TUNNEL RELEASE (Right)  SURGEON:  Surgeon(s) and Role:    * Daryll Brod, MD - Primary  PHYSICIAN ASSISTANT:   ASSISTANTS: none   ANESTHESIA:   local, regional and IV sedation  EBL:  1 mL   BLOOD ADMINISTERED:none  DRAINS: none   LOCAL MEDICATIONS USED:  BUPIVICAINE   SPECIMEN:  No Specimen  DISPOSITION OF SPECIMEN:  N/A  COUNTS:  YES  TOURNIQUET:   Total Tourniquet Time Documented: Forearm (Right) - 19 minutes Total: Forearm (Right) - 19 minutes   DICTATION: .Viviann Spare Dictation  PLAN OF CARE: Discharge to home after PACU  PATIENT DISPOSITION:  PACU - hemodynamically stable.

## 2018-02-17 NOTE — Anesthesia Procedure Notes (Signed)
Anesthesia Regional Block: Bier block (IV Regional)   Pre-Anesthetic Checklist: ,, timeout performed, Correct Patient, Correct Site, Correct Laterality, Correct Procedure, Correct Position, site marked, Risks and benefits discussed,  Surgical consent,  Pre-op evaluation,  At surgeon's request and post-op pain management  Laterality: Right  Prep: alcohol swabs        Procedures:,,,,, intact distal pulses, Esmarch exsanguination, single tourniquet utilized, #20gu IV placed  Narrative:  Start time: 02/17/2018 9:32 AM End time: 02/17/2018 9:34 AM Anesthesiologist: Brennan Bailey, MD CRNA: Genelle Bal, CRNA

## 2018-02-18 ENCOUNTER — Encounter (HOSPITAL_BASED_OUTPATIENT_CLINIC_OR_DEPARTMENT_OTHER): Payer: Self-pay | Admitting: Orthopedic Surgery

## 2018-06-04 ENCOUNTER — Emergency Department (HOSPITAL_COMMUNITY): Payer: Medicare HMO

## 2018-06-04 ENCOUNTER — Other Ambulatory Visit: Payer: Self-pay

## 2018-06-04 ENCOUNTER — Emergency Department (HOSPITAL_COMMUNITY)
Admission: EM | Admit: 2018-06-04 | Discharge: 2018-06-04 | Disposition: A | Discharge status: Home / Self Care | Payer: Medicare HMO | Attending: Emergency Medicine | Admitting: Emergency Medicine

## 2018-06-04 ENCOUNTER — Encounter (HOSPITAL_COMMUNITY): Payer: Self-pay | Admitting: Emergency Medicine

## 2018-06-04 DIAGNOSIS — M5412 Radiculopathy, cervical region: Secondary | ICD-10-CM | POA: Insufficient documentation

## 2018-06-04 DIAGNOSIS — F17210 Nicotine dependence, cigarettes, uncomplicated: Secondary | ICD-10-CM | POA: Diagnosis not present

## 2018-06-04 DIAGNOSIS — M436 Torticollis: Secondary | ICD-10-CM | POA: Insufficient documentation

## 2018-06-04 DIAGNOSIS — Z79899 Other long term (current) drug therapy: Secondary | ICD-10-CM | POA: Insufficient documentation

## 2018-06-04 DIAGNOSIS — I10 Essential (primary) hypertension: Secondary | ICD-10-CM | POA: Diagnosis not present

## 2018-06-04 DIAGNOSIS — M542 Cervicalgia: Secondary | ICD-10-CM | POA: Diagnosis present

## 2018-06-04 MED ORDER — OXYCODONE-ACETAMINOPHEN 5-325 MG PO TABS
2.0000 | ORAL_TABLET | Freq: Once | ORAL | Status: AC
  Administered 2018-06-04: 18:00:00 2 via ORAL
  Filled 2018-06-04: qty 2

## 2018-06-04 MED ORDER — METHOCARBAMOL 500 MG PO TABS
500.0000 mg | ORAL_TABLET | Freq: Once | ORAL | Status: AC
  Administered 2018-06-04: 500 mg via ORAL
  Filled 2018-06-04: qty 1

## 2018-06-04 MED ORDER — CYCLOBENZAPRINE HCL 5 MG PO TABS: 5.0000 mg | ORAL_TABLET | Freq: Three times a day (TID) | ORAL | 0 refills | Status: DC | PRN

## 2018-06-04 NOTE — ED Notes (Signed)
ED Provider at bedside. 

## 2018-06-04 NOTE — Discharge Instructions (Addendum)
Your CT scan is reassuring tonight for any obvious surgical emergency regarding your symptoms.  Your pain is possibly from a deep muscle spasm or inflammation causing irritation of the nerves into your left arm.  Continue using your home pain medication, your celebrex and the muscle relaxer you were prescribed tonight. Apply a heating pad to your left neck and shoulder for 20 minutes several times daily followed by gentle stretching as instructed.

## 2018-06-04 NOTE — ED Triage Notes (Signed)
Rounding completed in waiting area. Patient in NAD.

## 2018-06-04 NOTE — ED Triage Notes (Signed)
Patient reports pain in his neck with "electricity down my arm" since Tuesday. No other symptoms, denies CP.

## 2018-06-04 NOTE — ED Provider Notes (Signed)
[facility] EMERGENCY DEPARTMENT Provider Note   CSN: [id number] Arrival date & time: 06/04/18  1442    History   Chief Complaint Chief Complaint  Patient presents with  . Neck Pain    HPI Benjamin Solomon is a 53 y.o. male  With a history of anxiety, arthritis, chronic back pain, hypertension, recent right carpal tunnel surgery by Dr. Fredna Dow and a distant history of cervical fusion secondary to degenerative disc disease with known continued DDD of the cervical spine presenting with a 2-day history of rather acute onset of worsening pain radiating from his lower cervical spine across his left shoulder with intermittent "electric" like pain which radiates into all of his fingertips, worse with leftward head rotation.  He also has noticed decreased range of motion in this direction.  Patient is right-handed, he denies any new injuries or overuse.  He denies weakness in his hands or arms.  He is taking oxycodone chronically and was recently placed on Celebrex and these medicines do not improve his neck pain symptoms.  He denies fevers or chills, weakness, also no chest pain or shortness of breath, no headaches.     The history is provided by the patient.    Past Medical History:  Diagnosis Date  . Anxiety   . Arthritis   . Chronic back pain   . Depression   . Hemorrhoids   . Hyperplastic colon polyp   . Hypertension   . Opioid dependence ([place])   . Tubular adenoma     Patient Active Problem List   Diagnosis Date Noted  . S/P lumbar spinal fusion 08/07/2016  . Generalized anxiety disorder 10/04/2012  . Alcohol dependence ([place]) 06/26/2012  . Cocaine dependence ([place]) 06/26/2012  . Depressive disorder, not elsewhere classified 06/26/2012  . Bleeding external hemorrhoids 12/17/2011    Past Surgical History:  Procedure Laterality Date  . CARPAL TUNNEL RELEASE Right 02/17/2018   Procedure: CARPAL TUNNEL RELEASE;  Surgeon: Daryll Brod, MD;  Location: [facility];   Service: Orthopedics;  Laterality: Right;  . CERVICAL SPINE SURGERY  2009  . COLONOSCOPY    . EVALUATION UNDER ANESTHESIA WITH HEMORRHOIDECTOMY  12/20/2011  . FOOT SURGERY Right 2012   bone removed from the 5 th toe        Home Medications    Prior to Admission medications   Medication Sig Start Date End Date Taking? Authorizing Provider  ALPRAZolam Duanne Moron) 0.5 MG tablet Take 0.5 mg by mouth at bedtime as needed for sleep.  06/02/16   [provider]  amLODipine (NORVASC) 5 MG tablet Take 5 mg by mouth daily as needed (for high blood pressure).  01/27/13   [provider]  buPROPion (WELLBUTRIN XL) 300 MG 24 hr tablet Take 300 mg by mouth daily. 05/18/16   [provider]  clindamycin (CLEOCIN T) 1 % external solution Apply 1 application topically 2 (two) times daily as needed (boils).  10/13/15   [provider]  clindamycin (CLEOCIN) 300 MG capsule Take 300 mg by mouth 2 (two) times daily as needed (boils).  10/12/15   [provider]  cyclobenzaprine (FLEXERIL) 5 MG tablet Take 1 tablet (5 mg total) by mouth 3 (three) times daily as needed for muscle spasms. 06/04/18   Evalee Jefferson, PA-C  docusate sodium (COLACE) 100 MG capsule Take 200 mg by mouth daily.    [provider]  FLUoxetine (PROZAC) 20 MG tablet Take 20 mg by mouth daily.    [provider]  ibuprofen (ADVIL,MOTRIN) 600 MG tablet Take 1 tablet (600 mg total) by mouth every 6 (six) hours as needed. 03/21/14   Evalee Jefferson, PA-C  lisinopril-hydrochlorothiazide (PRINZIDE,ZESTORETIC) 20-12.5 MG per tablet Take 1 tablet by mouth daily.    [provider]  oxyCODONE-acetaminophen (PERCOCET) 10-325 MG tablet Take 1 tablet by mouth every 4 (four) hours as needed for pain. 08/08/16   Eustace Moore, MD  OXYCONTIN 20 MG 12 hr tablet Take 20 mg by mouth every 12 (twelve) hours.  06/25/16   [provider]  tetrahydrozoline 0.05 % ophthalmic solution Place 2 drops into  both eyes daily as needed (dry eyes).    [provider]  traMADol (ULTRAM) 50 MG tablet Take 1 tablet (50 mg total) by mouth every 6 (six) hours as needed. 02/17/18   Daryll Brod, MD    Family History Family History  Problem Relation Age of Onset  . Hypertension Mother   . Hypertension Father   . Alcohol abuse Brother   . Drug abuse Brother   . Diabetes Neg Hx   . Heart disease Neg Hx   . Kidney disease Neg Hx   . Liver disease Neg Hx     Social History Social History   Tobacco Use  . Smoking status: Current Every Day Smoker    Packs/day: 0.25    Years: 20.00    Pack years: 5.00    Types: Cigarettes  . Smokeless tobacco: Never Used  Substance Use Topics  . Alcohol use: No    Alcohol/week: 0.0 standard drinks  . Drug use: No     Allergies   Patient has no known allergies.   Review of Systems Review of Systems  Constitutional: Negative for fever.  Musculoskeletal: Positive for arthralgias, back pain, neck pain and neck stiffness. Negative for joint swelling and myalgias.  Neurological: Negative for tremors, weakness and numbness.     Physical Exam Updated Vital Signs BP 120/81 (BP Location: Right Arm)   Pulse 90   Temp 98.1 F (36.7 C) (Oral)   Resp 16   Ht 5\' 8"  (1.727 m)   Wt 72.6 kg   SpO2 100%   BMI 24.33 kg/m   Physical Exam Vitals signs and nursing note reviewed.  Constitutional:      Appearance: He is well-developed.  HENT:     Head: Normocephalic.  Eyes:     Conjunctiva/sclera: Conjunctivae normal.  Neck:     Musculoskeletal: Normal range of motion and neck supple.  Cardiovascular:     Rate and Rhythm: Normal rate.     Pulses:          Radial pulses are 2+ on the right side and 2+ on the left side.  Pulmonary:     Effort: Pulmonary effort is normal.  Abdominal:     General: Bowel sounds are normal. There is no distension.     Palpations: Abdomen is soft. There is no mass.  Musculoskeletal:     Lumbar back: He exhibits no  tenderness.     Comments: Patient does have some midline tenderness in the C6-C7 region which radiates into his left trapezius muscle to his left scapular spine.  There is reduced range of motion with leftward head rotation.  He has equal grip strength.  Sensation to fine touch and sharp is present and equal in his bilateral hands and arms.  Skin:    General: Skin is warm and dry.  Neurological:     Mental Status: He  is alert.     Sensory: No sensory deficit.     Motor: No tremor or atrophy.     Gait: Gait normal.     Deep Tendon Reflexes:     Reflex Scores:      Bicep reflexes are 1+ on the right side and 1+ on the left side.    Comments: No strength deficit noted in hip and knee flexor and extensor muscle groups.  Ankle flexion and extension intact.      ED Treatments / Results  Labs (all labs ordered are listed, but only abnormal results are displayed) Labs Reviewed - No data to display  EKG None  Radiology No results found.  Ct c spine completed but will not cross over from PACS.   Impression without acute findings:  1. Straightening of the cervical spine with post fusion changes at C4-C5. No acute osseous abnormality. 2. Mid degenerative changes.   Procedures Procedures (including critical care time)  Medications Ordered in ED Medications  oxyCODONE-acetaminophen (PERCOCET/ROXICET) 5-325 MG per tablet 2 tablet (2 tablets Oral Given 06/04/18 1742)  methocarbamol (ROBAXIN) tablet 500 mg (500 mg Oral Given 06/04/18 1742)     Initial Impression / Assessment and Plan / ED Course  I have reviewed the triage vital signs and the nursing notes.  Pertinent labs & imaging results that were available during my care of the patient were reviewed by me and considered in my medical decision making (see chart for details).        Pt with cervical radiculopathy but no weakness on exam.  Suspect deep muscle spasm given tenderness at left trap and reduced leftward head rotation.   Discussed heat, stretching, continue home meds, added flexeril. Plan f/u with his neurosurgeon prn if sx persist or worsen.  Return precautions discussed.  Final Clinical Impressions(s) / ED Diagnoses   Final diagnoses:  Torticollis  Cervical radiculopathy    ED Discharge Orders         Ordered    cyclobenzaprine (FLEXERIL) 5 MG tablet  3 times daily PRN     06/04/18 2011           Landis Martins 06/04/18 2040    Nat Christen, MD 06/05/18 [phone]

## 2018-07-06 ENCOUNTER — Other Ambulatory Visit (HOSPITAL_COMMUNITY): Payer: Self-pay | Admitting: Pulmonary Disease

## 2018-07-06 ENCOUNTER — Other Ambulatory Visit: Payer: Self-pay | Admitting: Pulmonary Disease

## 2018-07-06 DIAGNOSIS — M792 Neuralgia and neuritis, unspecified: Secondary | ICD-10-CM

## 2018-07-13 ENCOUNTER — Ambulatory Visit (HOSPITAL_COMMUNITY): Payer: Medicare HMO

## 2018-07-13 ENCOUNTER — Encounter (HOSPITAL_COMMUNITY): Payer: Self-pay

## 2018-07-16 ENCOUNTER — Other Ambulatory Visit: Payer: Self-pay

## 2018-07-16 ENCOUNTER — Ambulatory Visit (HOSPITAL_COMMUNITY)
Admission: RE | Admit: 2018-07-16 | Discharge: 2018-07-16 | Disposition: A | Discharge status: Home / Self Care | Payer: Medicare HMO | Source: Ambulatory Visit | Attending: Pulmonary Disease | Admitting: Pulmonary Disease

## 2018-07-16 DIAGNOSIS — M792 Neuralgia and neuritis, unspecified: Secondary | ICD-10-CM | POA: Diagnosis not present

## 2018-11-04 ENCOUNTER — Ambulatory Visit (HOSPITAL_COMMUNITY): Payer: Medicare HMO | Attending: Specialist

## 2018-11-04 ENCOUNTER — Other Ambulatory Visit: Payer: Self-pay

## 2018-11-04 ENCOUNTER — Encounter (HOSPITAL_COMMUNITY): Payer: Self-pay

## 2018-11-04 DIAGNOSIS — M25561 Pain in right knee: Secondary | ICD-10-CM

## 2018-11-04 NOTE — Therapy (Signed)
[place], [place], [address] Phone: [phone]   Fax:  [phone]  Patient Details  Name: Benjamin Solomon MRN: [id number] Date of Birth: Apr 06, 1965 Referring Provider:  Sydnee Cabal, MD  Encounter Date: 11/04/2018   Patient arrived for physical therapy evaluation s/p R knee scope on 10/20/18. Patient reported 3/10 knee pain, chronic not rated back pain, and recently took pain medication in preparation for PT eval. Patient able to report subjective comments and complete FOTO score in sitting, but with increased back pain, required to get up and walk around. Patient attempted to ambulate loop (226 ft) around gym for gait assessment, but unable to complete due to back pain and returned to sitting to alleviate pain. Therapist attempted to assess patient's knee AROM in supine on therapy mat, but patient unable to tolerate knee flexion in supine due to increasing back pain once again. Patient reports shooting pains down BLE, with R>L that resolved to tolerable in sidelying. Therapist able to roughly measure bil knee flexion in L sidelying position at 112 deg, but pt limited due to back pain. Patient demonstrates 0 deg of extension in bil knees. Therapist unable to complete evaluation of patient's strength or flexibility due to increasing pain. No HEP issued due to concerns patient would perform incorrectly and unable to tolerate remaining in clinic for demonstrates of exercise technique. Patient reports experiencing these high pain days occasionally and this is not out of the norm. Educated patient to call his neurosurgeon ([facility] L5-S1 in 2018) to report this incident for possible further work-up. Patient doesn't appear  to be limited in his gait secondary to R knee scope. Physical Therapy Evaluation was not complete and pt has f/u with orthopedic surgeon tomorrow. Will reschedule evaluation for future pending patient tolerance to activity  and pending f/u with orthopedic surgeon.   Talbot Grumbling PT, DPT 11/04/18, 3:28 PM [place] [address] [place], [place], [address] Phone: [phone]   Fax:  [phone]

## 2018-11-05 ENCOUNTER — Telehealth (HOSPITAL_COMMUNITY): Payer: Self-pay

## 2018-11-05 NOTE — Telephone Encounter (Signed)
Called the r/s the pt's evaluation appt per provider's request. Lmonvm for a return call.

## 2018-11-24 ENCOUNTER — Ambulatory Visit
Admission: RE | Admit: 2018-11-24 | Discharge: 2018-11-24 | Disposition: A | Discharge status: Home / Self Care | Payer: Medicare HMO | Source: Ambulatory Visit | Attending: Physician Assistant | Admitting: Physician Assistant

## 2018-11-24 ENCOUNTER — Other Ambulatory Visit: Payer: Self-pay

## 2018-11-24 ENCOUNTER — Other Ambulatory Visit: Payer: Self-pay | Admitting: Physician Assistant

## 2018-11-24 ENCOUNTER — Ambulatory Visit (HOSPITAL_COMMUNITY): Payer: Medicare HMO | Attending: Specialist

## 2018-11-24 ENCOUNTER — Encounter (HOSPITAL_COMMUNITY): Payer: Self-pay

## 2018-11-24 DIAGNOSIS — M545 Low back pain, unspecified: Secondary | ICD-10-CM

## 2018-11-24 DIAGNOSIS — M25561 Pain in right knee: Secondary | ICD-10-CM

## 2018-11-24 NOTE — Therapy (Signed)
[place], [place], [address] Phone: [phone]   Fax:  [phone]  Physical Therapy Evaluation  Patient Details  Name: Benjamin Solomon MRN: [id number] Date of Birth: April 17, 1965 Referring Provider (PT): Sydnee Cabal, MD   Encounter Date: 11/24/2018  PT End of Session - 11/24/18 1114    Visit Number  1    Number of Visits  1    Authorization Type  [employer] MCR    Authorization Time Period  11/24/18 (eval only)    PT Start Time  1045    PT Stop Time  1110    PT Time Calculation (min)  25 min    Activity Tolerance  Patient limited by pain    Behavior During Therapy  [facility] for tasks assessed/performed       Past Medical History:  Diagnosis Date  . Anxiety   . Arthritis   . Chronic back pain   . Depression   . Hemorrhoids   . Hyperplastic colon polyp   . Hypertension   . Opioid dependence ([place])   . Tubular adenoma     Past Surgical History:  Procedure Laterality Date  . CARPAL TUNNEL RELEASE Right 02/17/2018   Procedure: CARPAL TUNNEL RELEASE;  Surgeon: Daryll Brod, MD;  Location: [facility];  Service: Orthopedics;  Laterality: Right;  . CERVICAL SPINE SURGERY  2009  . COLONOSCOPY    . EVALUATION UNDER ANESTHESIA WITH HEMORRHOIDECTOMY  12/20/2011  . FOOT SURGERY Right 2012   bone removed from the 5 th toe    There were no vitals filed for this visit.   Subjective Assessment - 11/24/18 1043    Subjective  Pt reports torn meniscus on R knee, repaired on 10/6. Pt reports using SPC for back problems always. Pt reports no issues with standing, sitting or walking per R knee. Pt reports increased difficulty with stairs due to R knee. Pt reports 15 steps inside the home to second floor; able to live on main floor. Pt reports surgeon told him to come because most times patient's don't do the exercises at home. Pt reports R knee has buckled once, but he didn't fall. Pt reports his doctor gave him a  MRI referral, but he hasn't completed it yet.    Limitations  Other (comment)   stairs   How long can you sit comfortably?  no issues from knee, limited by back    How long can you stand comfortably?  no issues from knee, limited by back    How long can you walk comfortably?  no issues from knee, limited by back    Patient Stated Goals  get everything back to normal    Currently in Pain?  Yes    Pain Score  5     Pain Location  Knee    Pain Orientation  Right    Pain Descriptors / Indicators  Stabbing;Sharp    Pain Type  Surgical pain    Pain Onset  1 to 4 weeks ago    Pain Frequency  Intermittent    Aggravating Factors   stairs    Pain Relieving Factors  pain medication    Effect of Pain on Daily Activities  limited         [facility] PT Assessment - 11/24/18 0001      Assessment   Medical Diagnosis  R knee scope    Referring Provider (PT)  Sydnee Cabal, MD    Onset Date/Surgical  Date  10/20/18    Next MD Visit  11/05/18    Prior Therapy  Not for R knee      Precautions   Precautions  None      Restrictions   Weight Bearing Restrictions  No      Balance Screen   Has the patient fallen in the past 6 months  No    Has the patient had a decrease in activity level because of a fear of falling?   No    Is the patient reluctant to leave their home because of a fear of falling?   No      Prior Function   Level of Independence  Independent    Vocation  On disability    Leisure  Relaxing      Cognition   Overall Cognitive Status  Within Functional Limits for tasks assessed      Sensation   Light Touch  Appears Intact      Functional Tests   Functional tests  Sit to Stand      Sit to Stand   Comments  Able to perform with SPC, increased weight-bearing on RLE with slight shifting away from LLE, possibly due to LBP complaints      AROM   AROM Assessment Site  Knee    Right Knee Extension  0    Right Knee Flexion  115      Strength   Overall Strength Comments  unable  to tolerate further testing due to LBP    Right Hip ABduction  3+/5    Right Knee Extension  4+/5    Right Ankle Dorsiflexion  4+/5    Left Ankle Dorsiflexion  4+/5      Special Tests    Special Tests  Lumbar    Lumbar Tests  Slump Test;Straight Leg Raise      Slump test   Findings  Positive    Comment  bilateral      Straight Leg Raise   Findings  Positive    Side   Right      Ambulation/Gait   Ambulation/Gait  Yes    Assistive device  Straight cane    Gait Pattern  Within Functional Limits;Step-through pattern    Gait Comments  unable to complete 2MWT due to increased LBP, required seated rest break after 65 seconds; gait pattern WNL, step through, no R knee buckling noted, good bil foot clearance, R knee extension in standing         Objective measurements completed on examination: See above findings.       PT Education - 11/24/18 1044    Education Details  Assessment findings, MRI for diagnostic cause of LBP    Person(s) Educated  Patient    Methods  Explanation    Comprehension  Verbalized understanding       PT Short Term Goals - 11/24/18 1216      PT SHORT TERM GOAL #1   Title  Pt will verbalize understanding of appropriate activities to maintain R knee strength and verbalize understanding of importance of completing lumbar MRI to prevent back pain from limiting functional status or increasing risk for falls.    Time  1    Period  Days    Status  Achieved    Target Date  11/24/18                Plan - 11/24/18 1116    Clinical Impression Statement  Pt is a  pleasant 53 YO male s/p R knee scope on 10/20/18. Pt demonstrates R knee strength and AROM WFL. Pt performs STS transfers from chair and mat table with increased RLE weight-bearing demonstrating mild shift away from LLE. Pt ambulates with functional gait pattern, achieving full R knee extension in stance, good R knee flexion in swing, bil foot clearance, and no R knee instability noted. Pt  limited during evaluation due to LBP increasing with MMT and when attempting to achieve positions for MMT. Pt with positive slump test bilaterally and positive SLR test on RLE. Attempted to teach pt quad sets and LAQ, but with significant increase in LBP and pt unable to tolerate exercise. Educated pt to complete lumbar MRI for diagnostic purposes. Pt denies changes in bowel/bladder, saddle paresthesia or BLE weakness. Pt with good functional strength and gait pattern and presents with limitations secondary to LBP and not R knee pain. Educated pt to continue functional activities as able in order to maintain RLE strength.    Personal Factors and Comorbidities  Comorbidity 3+    Comorbidities  HTN, chronic back pain, arthritis    Examination-Activity Limitations  Stairs    Examination-Participation Restrictions  Yard Work    Stability/Clinical Decision Making  Stable/Uncomplicated    Clinical Decision Making  Low    Rehab Potential  --   N/A   PT Frequency  One time visit    PT Duration  --   N/A   PT Treatment/Interventions  --   N/A   PT Next Visit Plan  Evaluation only. No PT for R knee; pt limited by LBP and educated to complete MRI for further diagnostics.    PT Home Exercise Plan  Unable to issue due to increased LBP with attempted exercises at evaluation.    Consulted and Agree with Plan of Care  Patient       Patient will benefit from skilled therapeutic intervention in order to improve the following deficits and impairments:  (N/A)  Visit Diagnosis: Acute pain of right knee     Problem List Patient Active Problem List   Diagnosis Date Noted  . S/P lumbar spinal fusion 08/07/2016  . Generalized anxiety disorder 10/04/2012  . Alcohol dependence ([place]) 06/26/2012  . Cocaine dependence ([place]) 06/26/2012  . Depressive disorder, not elsewhere classified 06/26/2012  . Bleeding external hemorrhoids 12/17/2011     Talbot Grumbling PT, DPT 11/24/18, 12:22 PM [facility] [address] [place], [place], [address] Phone: [phone]   Fax:  [phone]  Name: Benjamin Solomon MRN: [id number] Date of Birth: January 15, 1966

## 2018-12-12 ENCOUNTER — Other Ambulatory Visit: Payer: Self-pay

## 2018-12-12 ENCOUNTER — Emergency Department (HOSPITAL_COMMUNITY)
Admission: EM | Admit: 2018-12-12 | Discharge: 2018-12-12 | Disposition: A | Discharge status: Home / Self Care | Payer: Medicare HMO | Attending: Emergency Medicine | Admitting: Emergency Medicine

## 2018-12-12 ENCOUNTER — Encounter (HOSPITAL_COMMUNITY): Payer: Self-pay | Admitting: Emergency Medicine

## 2018-12-12 ENCOUNTER — Emergency Department (HOSPITAL_COMMUNITY): Payer: Medicare HMO

## 2018-12-12 DIAGNOSIS — I10 Essential (primary) hypertension: Secondary | ICD-10-CM | POA: Diagnosis not present

## 2018-12-12 DIAGNOSIS — F17210 Nicotine dependence, cigarettes, uncomplicated: Secondary | ICD-10-CM | POA: Diagnosis not present

## 2018-12-12 DIAGNOSIS — Z79899 Other long term (current) drug therapy: Secondary | ICD-10-CM | POA: Diagnosis not present

## 2018-12-12 DIAGNOSIS — Z79891 Long term (current) use of opiate analgesic: Secondary | ICD-10-CM | POA: Insufficient documentation

## 2018-12-12 DIAGNOSIS — R079 Chest pain, unspecified: Secondary | ICD-10-CM | POA: Diagnosis present

## 2018-12-12 DIAGNOSIS — R0789 Other chest pain: Secondary | ICD-10-CM | POA: Insufficient documentation

## 2018-12-12 LAB — CBC WITH DIFFERENTIAL/PLATELET
Abs Immature Granulocytes: 0.01 10*3/uL (ref 0.00–0.07)
Basophils Absolute: 0 10*3/uL (ref 0.0–0.1)
Basophils Relative: 1 %
Eosinophils Absolute: 0.2 10*3/uL (ref 0.0–0.5)
Eosinophils Relative: 3 %
HCT: 38.1 % — ABNORMAL LOW (ref 39.0–52.0)
Hemoglobin: 12.4 g/dL — ABNORMAL LOW (ref 13.0–17.0)
Immature Granulocytes: 0 %
Lymphocytes Relative: 37 %
Lymphs Abs: 3.2 10*3/uL (ref 0.7–4.0)
MCH: 30 pg (ref 26.0–34.0)
MCHC: 32.5 g/dL (ref 30.0–36.0)
MCV: 92.3 fL (ref 80.0–100.0)
Monocytes Absolute: 0.6 10*3/uL (ref 0.1–1.0)
Monocytes Relative: 8 %
Neutro Abs: 4.4 10*3/uL (ref 1.7–7.7)
Neutrophils Relative %: 51 %
Platelets: 253 10*3/uL (ref 150–400)
RBC: 4.13 MIL/uL — ABNORMAL LOW (ref 4.22–5.81)
RDW: 14.5 % (ref 11.5–15.5)
WBC: 8.6 10*3/uL (ref 4.0–10.5)
nRBC: 0 % (ref 0.0–0.2)

## 2018-12-12 LAB — TROPONIN I (HIGH SENSITIVITY)
Troponin I (High Sensitivity): 3 ng/L (ref <18)
Troponin I (High Sensitivity): 3 ng/L (ref <18)

## 2018-12-12 LAB — BASIC METABOLIC PANEL
Anion gap: 8 (ref 5–15)
BUN: 8 mg/dL (ref 6–20)
CO2: 26 mmol/L (ref 22–32)
Calcium: 8.9 mg/dL (ref 8.9–10.3)
Chloride: 104 mmol/L (ref 98–111)
Creatinine, Ser: 1.12 mg/dL (ref 0.61–1.24)
GFR calc Af Amer: >60 mL/min (ref >60)
GFR calc non Af Amer: >60 mL/min (ref >60)
Glucose, Bld: 91 mg/dL (ref 70–99)
Potassium: 4 mmol/L (ref 3.5–5.1)
Sodium: 138 mmol/L (ref 135–145)

## 2018-12-12 LAB — D-DIMER, QUANTITATIVE: D-Dimer, Quant: 0.45 ug/mL-FEU (ref 0.00–0.50)

## 2018-12-12 MED ORDER — DIAZEPAM 2 MG PO TABS
2.0000 mg | ORAL_TABLET | Freq: Once | ORAL | Status: AC
  Administered 2018-12-12: 2 mg via ORAL
  Filled 2018-12-12: qty 1

## 2018-12-12 MED ORDER — OXYCODONE-ACETAMINOPHEN 5-325 MG PO TABS
2.0000 | ORAL_TABLET | Freq: Once | ORAL | Status: AC
  Administered 2018-12-12: 2 via ORAL
  Filled 2018-12-12: qty 2

## 2018-12-12 MED ORDER — NAPROXEN 375 MG PO TABS: 375.0000 mg | ORAL_TABLET | Freq: Two times a day (BID) | ORAL | 0 refills | Status: AC

## 2018-12-12 MED ORDER — ASPIRIN 81 MG PO CHEW
243.0000 mg | CHEWABLE_TABLET | Freq: Once | ORAL | Status: AC
  Administered 2018-12-12: 243 mg via ORAL
  Filled 2018-12-12: qty 3

## 2018-12-12 MED ORDER — KETOROLAC TROMETHAMINE 30 MG/ML IJ SOLN
30.0000 mg | Freq: Once | INTRAMUSCULAR | Status: AC
  Administered 2018-12-12: 30 mg via INTRAVENOUS
  Filled 2018-12-12: qty 1

## 2018-12-12 NOTE — ED Triage Notes (Signed)
Pt states that he is having left sided chest pains that started last night

## 2018-12-12 NOTE — ED Provider Notes (Signed)
[facility] EMERGENCY DEPARTMENT Provider Note   CSN: [id number] Arrival date & time: 12/12/18  1149     History   Chief Complaint Chief Complaint  Patient presents with  . Chest Pain    HPI Benjamin Solomon is a 53 y.o. male.     HPI  Pt is a 53 y/o male with a h/o HTN, anxiety/depression, who presents to the ED today for eval of left sided chest pain that started last night around 3-4am. Pain has been constant since onset. Feels like a dull, achy pain. Pain radiates down the left arm. Pain currently rated 8/10. Pain has worsened since onset. He took some pain medication this AM and 81 mg asa and has not had improvement of symptoms. States pain is worse when he moves his arms. He reports some pain with inspiration. Denies pain is specifically associated with exertion. Denies any shortness of breath. Denies any recent falls or trauma. Reports rhinorrhea, congestion for the last week. Denies cough.  Reports he had meniscal surgery on the right knee last month. Reports right calf pain that was present last week, but it has since improved. He had some swelling in the leg as well which has improved. Denies hemoptysis, recent long travel, hormone use, personal hx of cancer, or hx of DVT/PE.    Reports a h/o tobacco use, 4-5 cigarettes per day for 20 years. Denies h/o HLD, DM or early fam hx of heart disease. Stopped using ETOH about 6 years ago. Reports h/o drug use but has not used in the last 6-7 years.  Past Medical History:  Diagnosis Date  . Anxiety   . Arthritis   . Chronic back pain   . Depression   . Hemorrhoids   . Hyperplastic colon polyp   . Hypertension   . Opioid dependence ([place])   . Tubular adenoma     Patient Active Problem List   Diagnosis Date Noted  . S/P lumbar spinal fusion 08/07/2016  . Generalized anxiety disorder 10/04/2012  . Alcohol dependence ([place]) 06/26/2012  . Cocaine dependence ([place]) 06/26/2012  . Depressive disorder, not elsewhere classified  06/26/2012  . Bleeding external hemorrhoids 12/17/2011    Past Surgical History:  Procedure Laterality Date  . CARPAL TUNNEL RELEASE Right 02/17/2018   Procedure: CARPAL TUNNEL RELEASE;  Surgeon: Daryll Brod, MD;  Location: [facility];  Service: Orthopedics;  Laterality: Right;  . CERVICAL SPINE SURGERY  2009  . COLONOSCOPY    . EVALUATION UNDER ANESTHESIA WITH HEMORRHOIDECTOMY  12/20/2011  . FOOT SURGERY Right 2012   bone removed from the 5 th toe        Home Medications    Prior to Admission medications   Medication Sig Start Date End Date Taking? Authorizing Provider  ALPRAZolam Duanne Moron) 0.5 MG tablet Take 0.5 mg by mouth at bedtime as needed for sleep.  06/02/16  Yes [provider]  buPROPion (WELLBUTRIN XL) 300 MG 24 hr tablet Take 300 mg by mouth daily. 05/18/16  Yes [provider]  clindamycin (CLEOCIN T) 1 % external solution Apply 1 application topically 2 (two) times daily as needed (boils).  10/13/15  Yes [provider]  clindamycin (CLEOCIN) 300 MG capsule Take 300 mg by mouth 2 (two) times daily as needed (boils).  10/12/15  Yes [provider]  cyclobenzaprine (FLEXERIL) 5 MG tablet Take 1 tablet (5 mg total) by mouth 3 (three) times daily as needed for muscle spasms. 06/04/18  Yes Evalee Jefferson, PA-C  docusate sodium (COLACE) 100 MG capsule Take 200 mg by mouth daily.   Yes [provider]  FLUoxetine (PROZAC) 20 MG tablet Take 20 mg by mouth daily.   Yes [provider]  lisinopril-hydrochlorothiazide (PRINZIDE,ZESTORETIC) 20-12.5 MG per tablet Take 1 tablet by mouth daily.   Yes [provider]  oxyCODONE-acetaminophen (PERCOCET) 10-325 MG tablet Take 1 tablet by mouth every 4 (four) hours as needed for pain. 08/08/16  Yes Eustace Moore, MD  OXYCONTIN 20 MG 12 hr tablet Take 20 mg by mouth every 12 (twelve) hours.  06/25/16  Yes [provider]  amLODipine (NORVASC) 5 MG tablet Take 5 mg by  mouth daily as needed (for high blood pressure).  01/27/13   [provider]  naproxen (NAPROSYN) 375 MG tablet Take 1 tablet (375 mg total) by mouth 2 (two) times daily for 5 days. 12/12/18 12/17/18  Nachman Sundt S, PA-C    Family History Family History  Problem Relation Age of Onset  . Hypertension Mother   . Hypertension Father   . Alcohol abuse Brother   . Drug abuse Brother   . Diabetes Neg Hx   . Heart disease Neg Hx   . Kidney disease Neg Hx   . Liver disease Neg Hx     Social History Social History   Tobacco Use  . Smoking status: Current Every Day Smoker    Packs/day: 0.25    Years: 20.00    Pack years: 5.00    Types: Cigarettes  . Smokeless tobacco: Never Used  Substance Use Topics  . Alcohol use: No    Alcohol/week: 0.0 standard drinks  . Drug use: No     Allergies   Patient has no known allergies.   Review of Systems Review of Systems  Constitutional: Negative for fever.  HENT: Negative for ear pain and sore throat.   Eyes: Negative for visual disturbance.  Respiratory: Negative for cough and shortness of breath.   Cardiovascular: Positive for chest pain and leg swelling (resolved).  Gastrointestinal: Negative for abdominal pain, constipation, diarrhea, nausea and vomiting.  Genitourinary: Negative for dysuria and hematuria.  Musculoskeletal: Negative for back pain.  Skin: Negative for color change and rash.  Neurological: Negative for headaches.  All other systems reviewed and are negative.    Physical Exam Updated Vital Signs BP 123/83   Pulse 81   Temp 98.9 F (37.2 C)   Resp 17   Ht 5\' 8"  (1.727 m)   Wt 72.6 kg   SpO2 100%   BMI 24.33 kg/m   Physical Exam Vitals signs and nursing note reviewed.  Constitutional:      General: He is not in acute distress.    Appearance: He is well-developed. He is not ill-appearing or toxic-appearing.  HENT:     Head: Normocephalic and atraumatic.  Eyes:     Conjunctiva/sclera:  Conjunctivae normal.  Neck:     Musculoskeletal: Neck supple.  Cardiovascular:     Rate and Rhythm: Normal rate and regular rhythm.     Pulses:          Dorsalis pedis pulses are 2+ on the right side and 2+ on the left side.     Heart sounds: No murmur.  Pulmonary:     Effort: Pulmonary effort is normal. No respiratory distress.     Breath sounds: Normal breath sounds. No decreased breath sounds, wheezing, rhonchi or rales.  Chest:     Chest wall: Tenderness (TTP to the left  pectoralis muscles, no crepitus) present.  Abdominal:     Palpations: Abdomen is soft.     Tenderness: There is no abdominal tenderness.  Musculoskeletal:     Right lower leg: He exhibits no tenderness. No edema.     Left lower leg: He exhibits no tenderness. No edema.  Skin:    General: Skin is warm and dry.  Neurological:     Mental Status: He is alert.      ED Treatments / Results  Labs (all labs ordered are listed, but only abnormal results are displayed) Labs Reviewed  CBC WITH DIFFERENTIAL/PLATELET - Abnormal; Notable for the following components:      Result Value   RBC 4.13 (*)    Hemoglobin 12.4 (*)    HCT 38.1 (*)    All other components within normal limits  BASIC METABOLIC PANEL  D-DIMER, QUANTITATIVE (NOT AT [facility])  TROPONIN I (HIGH SENSITIVITY)  TROPONIN I (HIGH SENSITIVITY)    EKG EKG Interpretation  Date/Time:  Saturday December 12 2018 12:17:07 EST Ventricular Rate:  92 PR Interval:    QRS Duration: 94 QT Interval:  364 QTC Calculation: 451 R Axis:   79 Text Interpretation: Sinus rhythm Baseline wander in lead(s) V5 V6 similar to prior 1/20 Confirmed by Aletta Edouard [phone]) on 12/12/2018 12:29:03 PM   Radiology Dg Chest Portable 1 View  Result Date: 12/12/2018 CLINICAL DATA:  53 year old presenting with acute onset of LEFT-sided chest pains that began last night. EXAM: PORTABLE CHEST 1 VIEW COMPARISON:  07/30/2016. FINDINGS: Cardiomediastinal silhouette unremarkable  and unchanged. Lungs clear. Bronchovascular markings normal. Pulmonary vascularity normal. No visible pleural effusions. No pneumothorax. IMPRESSION: No acute cardiopulmonary disease. Electronically Signed   By: Evangeline Dakin M.D.   On: 12/12/2018 12:59    Procedures Procedures (including critical care time)  Medications Ordered in ED Medications  aspirin chewable tablet 243 mg (243 mg Oral Given 12/12/18 1235)  ketorolac (TORADOL) 30 MG/ML injection 30 mg (30 mg Intravenous Given 12/12/18 1332)  diazepam (VALIUM) tablet 2 mg (2 mg Oral Given 12/12/18 1459)  oxyCODONE-acetaminophen (PERCOCET/ROXICET) 5-325 MG per tablet 2 tablet (2 tablets Oral Given 12/12/18 1513)     Initial Impression / Assessment and Plan / ED Course  I have reviewed the triage vital signs and the nursing notes.  Pertinent labs & imaging results that were available during my care of the patient were reviewed by me and considered in my medical decision making (see chart for details).     Final Clinical Impressions(s) / ED Diagnoses   Final diagnoses:  Atypical chest pain   53 y/o male w/ h/o htn, tobacco use presenting with left sided chest pain that started this AM.   VS reassuring initially though borderline tachycardic. Lungs ctab. Heart with rrr on exam. No peripheral edema or calf ttp  CBC with mild anemia, otherwise reassuring BMP wnl Ddimer negative   - PE or DVT much less likely Trop negative, delta remains negative and stable  - HEART score 3 (EKG, risk factors), low likelihood of ACS, atypical sxs  EKG with NSR, Baseline wander in lead(s) V5 V6 similar to prior 1/20  CXR with no acute cardiopulmonary disease by xray  2:30 PM reassessed pt. He states he feels much improvement after asa and toradol. He still does have some pain and now he states sxs seem somewhat consistent with muscle spasm. Will give dose of valium and reassess.   Reassessed pt. He feels improved. Discussed results and plan  for d/c  on antiinflammatory and robaxin. Advised close pcp f/u and discussed return precautions. He voices understanding of the plan and reasons to return. All questions answered, pt stable for d/c.   ED Discharge Orders         Ordered    naproxen (NAPROSYN) 375 MG tablet  2 times daily     12/12/18 [address] [place], PA-C 12/12/18 1625    Hayden Rasmussen, MD 12/12/18 [phone]

## 2018-12-12 NOTE — Discharge Instructions (Signed)
You may alternate taking Tylenol and Naproxen as needed for pain control. You may take Naproxen twice daily as directed on your discharge paperwork and you may take  [phone] mg of Tylenol every 6 hours. Do not exceed 4000 mg of Tylenol daily as this can lead to liver damage. Also, make sure to take Naproxen with meals as it can cause an upset stomach. Do not take other NSAIDs while taking Naproxen such as (Aleve, Ibuprofen, Aspirin, Celebrex, etc) and do not take more than the prescribed dose as this can lead to ulcers and bleeding in your GI tract. You may use warm and cold compresses to help with your symptoms.   Please follow up with your primary doctor within the next 7-10 days for re-evaluation and further treatment of your symptoms.   Please return to the ER sooner if you have any new or worsening symptoms.

## 2019-01-09 DIAGNOSIS — G5601 Carpal tunnel syndrome, right upper limb: Secondary | ICD-10-CM

## 2019-01-09 DIAGNOSIS — M1990 Unspecified osteoarthritis, unspecified site: Secondary | ICD-10-CM | POA: Insufficient documentation

## 2019-01-09 DIAGNOSIS — I10 Essential (primary) hypertension: Secondary | ICD-10-CM | POA: Insufficient documentation

## 2019-01-09 DIAGNOSIS — F329 Major depressive disorder, single episode, unspecified: Secondary | ICD-10-CM

## 2019-01-09 DIAGNOSIS — M545 Low back pain, unspecified: Secondary | ICD-10-CM | POA: Insufficient documentation

## 2019-01-09 DIAGNOSIS — N401 Enlarged prostate with lower urinary tract symptoms: Secondary | ICD-10-CM

## 2019-01-19 ENCOUNTER — Other Ambulatory Visit: Payer: Self-pay

## 2019-01-19 ENCOUNTER — Ambulatory Visit: Payer: PPO | Attending: Internal Medicine

## 2019-01-19 DIAGNOSIS — Z20822 Contact with and (suspected) exposure to covid-19: Secondary | ICD-10-CM

## 2019-01-21 LAB — NOVEL CORONAVIRUS, NAA: SARS-CoV-2, NAA: NOT DETECTED

## 2019-02-10 DIAGNOSIS — Z9889 Other specified postprocedural states: Secondary | ICD-10-CM | POA: Diagnosis not present

## 2019-02-10 DIAGNOSIS — M25562 Pain in left knee: Secondary | ICD-10-CM | POA: Diagnosis not present

## 2019-02-10 DIAGNOSIS — M1711 Unilateral primary osteoarthritis, right knee: Secondary | ICD-10-CM | POA: Diagnosis not present

## 2019-02-11 DIAGNOSIS — M5412 Radiculopathy, cervical region: Secondary | ICD-10-CM | POA: Diagnosis not present

## 2019-02-11 DIAGNOSIS — I10 Essential (primary) hypertension: Secondary | ICD-10-CM | POA: Diagnosis not present

## 2019-02-15 DIAGNOSIS — G894 Chronic pain syndrome: Secondary | ICD-10-CM | POA: Diagnosis not present

## 2019-02-15 DIAGNOSIS — M79602 Pain in left arm: Secondary | ICD-10-CM | POA: Diagnosis not present

## 2019-02-15 DIAGNOSIS — M5412 Radiculopathy, cervical region: Secondary | ICD-10-CM | POA: Diagnosis not present

## 2019-02-15 DIAGNOSIS — M961 Postlaminectomy syndrome, not elsewhere classified: Secondary | ICD-10-CM | POA: Diagnosis not present

## 2019-02-15 DIAGNOSIS — Z79891 Long term (current) use of opiate analgesic: Secondary | ICD-10-CM | POA: Diagnosis not present

## 2019-02-15 DIAGNOSIS — Z79899 Other long term (current) drug therapy: Secondary | ICD-10-CM | POA: Diagnosis not present

## 2019-02-23 DIAGNOSIS — M542 Cervicalgia: Secondary | ICD-10-CM | POA: Diagnosis not present

## 2019-02-23 DIAGNOSIS — M5412 Radiculopathy, cervical region: Secondary | ICD-10-CM | POA: Diagnosis not present

## 2019-02-25 DIAGNOSIS — M5412 Radiculopathy, cervical region: Secondary | ICD-10-CM | POA: Diagnosis not present

## 2019-03-03 DIAGNOSIS — R69 Illness, unspecified: Secondary | ICD-10-CM | POA: Diagnosis not present

## 2019-03-03 DIAGNOSIS — Z01818 Encounter for other preprocedural examination: Secondary | ICD-10-CM | POA: Diagnosis not present

## 2019-03-10 DIAGNOSIS — M4802 Spinal stenosis, cervical region: Secondary | ICD-10-CM | POA: Diagnosis not present

## 2019-03-10 DIAGNOSIS — Z981 Arthrodesis status: Secondary | ICD-10-CM | POA: Diagnosis not present

## 2019-03-10 DIAGNOSIS — M4722 Other spondylosis with radiculopathy, cervical region: Secondary | ICD-10-CM | POA: Diagnosis not present

## 2019-03-10 DIAGNOSIS — M5412 Radiculopathy, cervical region: Secondary | ICD-10-CM | POA: Diagnosis not present

## 2019-03-15 DIAGNOSIS — G894 Chronic pain syndrome: Secondary | ICD-10-CM | POA: Diagnosis not present

## 2019-03-15 DIAGNOSIS — M47817 Spondylosis without myelopathy or radiculopathy, lumbosacral region: Secondary | ICD-10-CM | POA: Diagnosis not present

## 2019-03-15 DIAGNOSIS — M5136 Other intervertebral disc degeneration, lumbar region: Secondary | ICD-10-CM | POA: Diagnosis not present

## 2019-03-15 DIAGNOSIS — M5412 Radiculopathy, cervical region: Secondary | ICD-10-CM | POA: Diagnosis not present

## 2019-03-18 DIAGNOSIS — R69 Illness, unspecified: Secondary | ICD-10-CM | POA: Diagnosis not present

## 2019-03-18 DIAGNOSIS — Z79899 Other long term (current) drug therapy: Secondary | ICD-10-CM | POA: Diagnosis not present

## 2019-03-18 DIAGNOSIS — G4700 Insomnia, unspecified: Secondary | ICD-10-CM | POA: Diagnosis not present

## 2019-03-18 DIAGNOSIS — Z79891 Long term (current) use of opiate analgesic: Secondary | ICD-10-CM | POA: Diagnosis not present

## 2019-03-18 DIAGNOSIS — F331 Major depressive disorder, recurrent, moderate: Secondary | ICD-10-CM | POA: Diagnosis not present

## 2019-03-25 DIAGNOSIS — R69 Illness, unspecified: Secondary | ICD-10-CM | POA: Diagnosis not present

## 2019-03-25 DIAGNOSIS — Z79891 Long term (current) use of opiate analgesic: Secondary | ICD-10-CM | POA: Diagnosis not present

## 2019-03-25 DIAGNOSIS — Z79899 Other long term (current) drug therapy: Secondary | ICD-10-CM | POA: Diagnosis not present

## 2019-03-25 DIAGNOSIS — F1120 Opioid dependence, uncomplicated: Secondary | ICD-10-CM | POA: Diagnosis not present

## 2019-03-25 DIAGNOSIS — F331 Major depressive disorder, recurrent, moderate: Secondary | ICD-10-CM | POA: Diagnosis not present

## 2019-03-25 DIAGNOSIS — G4700 Insomnia, unspecified: Secondary | ICD-10-CM | POA: Diagnosis not present

## 2019-04-06 ENCOUNTER — Ambulatory Visit: Payer: Medicare HMO | Admitting: Family Medicine

## 2019-04-08 DIAGNOSIS — R69 Illness, unspecified: Secondary | ICD-10-CM | POA: Diagnosis not present

## 2019-04-08 DIAGNOSIS — Z79899 Other long term (current) drug therapy: Secondary | ICD-10-CM | POA: Diagnosis not present

## 2019-04-08 DIAGNOSIS — G4700 Insomnia, unspecified: Secondary | ICD-10-CM | POA: Diagnosis not present

## 2019-04-08 DIAGNOSIS — F331 Major depressive disorder, recurrent, moderate: Secondary | ICD-10-CM | POA: Diagnosis not present

## 2019-04-15 DIAGNOSIS — R69 Illness, unspecified: Secondary | ICD-10-CM | POA: Diagnosis not present

## 2019-04-15 DIAGNOSIS — F331 Major depressive disorder, recurrent, moderate: Secondary | ICD-10-CM | POA: Diagnosis not present

## 2019-04-15 DIAGNOSIS — Z79899 Other long term (current) drug therapy: Secondary | ICD-10-CM | POA: Diagnosis not present

## 2019-04-15 DIAGNOSIS — G4700 Insomnia, unspecified: Secondary | ICD-10-CM | POA: Diagnosis not present

## 2019-04-22 DIAGNOSIS — G4700 Insomnia, unspecified: Secondary | ICD-10-CM | POA: Diagnosis not present

## 2019-04-22 DIAGNOSIS — Z79899 Other long term (current) drug therapy: Secondary | ICD-10-CM | POA: Diagnosis not present

## 2019-04-22 DIAGNOSIS — M5412 Radiculopathy, cervical region: Secondary | ICD-10-CM | POA: Diagnosis not present

## 2019-04-22 DIAGNOSIS — R69 Illness, unspecified: Secondary | ICD-10-CM | POA: Diagnosis not present

## 2019-04-22 DIAGNOSIS — F331 Major depressive disorder, recurrent, moderate: Secondary | ICD-10-CM | POA: Diagnosis not present

## 2019-04-22 DIAGNOSIS — M5416 Radiculopathy, lumbar region: Secondary | ICD-10-CM | POA: Diagnosis not present

## 2019-05-13 DIAGNOSIS — Z79899 Other long term (current) drug therapy: Secondary | ICD-10-CM | POA: Diagnosis not present

## 2019-05-13 DIAGNOSIS — Z79891 Long term (current) use of opiate analgesic: Secondary | ICD-10-CM | POA: Diagnosis not present

## 2019-05-13 DIAGNOSIS — R69 Illness, unspecified: Secondary | ICD-10-CM | POA: Diagnosis not present

## 2019-05-13 DIAGNOSIS — F331 Major depressive disorder, recurrent, moderate: Secondary | ICD-10-CM | POA: Diagnosis not present

## 2019-05-26 DIAGNOSIS — R351 Nocturia: Secondary | ICD-10-CM | POA: Diagnosis not present

## 2019-05-26 DIAGNOSIS — N401 Enlarged prostate with lower urinary tract symptoms: Secondary | ICD-10-CM | POA: Diagnosis not present

## 2019-06-03 DIAGNOSIS — F331 Major depressive disorder, recurrent, moderate: Secondary | ICD-10-CM | POA: Diagnosis not present

## 2019-06-03 DIAGNOSIS — R69 Illness, unspecified: Secondary | ICD-10-CM | POA: Diagnosis not present

## 2019-06-03 DIAGNOSIS — G4700 Insomnia, unspecified: Secondary | ICD-10-CM | POA: Diagnosis not present

## 2019-06-03 DIAGNOSIS — Z79899 Other long term (current) drug therapy: Secondary | ICD-10-CM | POA: Diagnosis not present

## 2019-06-08 DIAGNOSIS — R69 Illness, unspecified: Secondary | ICD-10-CM | POA: Diagnosis not present

## 2019-06-17 DIAGNOSIS — Z79899 Other long term (current) drug therapy: Secondary | ICD-10-CM | POA: Diagnosis not present

## 2019-06-17 DIAGNOSIS — Z79891 Long term (current) use of opiate analgesic: Secondary | ICD-10-CM | POA: Diagnosis not present

## 2019-06-17 DIAGNOSIS — R69 Illness, unspecified: Secondary | ICD-10-CM | POA: Diagnosis not present

## 2019-06-17 DIAGNOSIS — F331 Major depressive disorder, recurrent, moderate: Secondary | ICD-10-CM | POA: Diagnosis not present

## 2019-06-21 DIAGNOSIS — N398 Other specified disorders of urinary system: Secondary | ICD-10-CM | POA: Diagnosis not present

## 2019-06-22 DIAGNOSIS — L600 Ingrowing nail: Secondary | ICD-10-CM | POA: Diagnosis not present

## 2019-06-22 DIAGNOSIS — M25774 Osteophyte, right foot: Secondary | ICD-10-CM | POA: Diagnosis not present

## 2019-07-01 DIAGNOSIS — Z79891 Long term (current) use of opiate analgesic: Secondary | ICD-10-CM | POA: Diagnosis not present

## 2019-07-01 DIAGNOSIS — Z79899 Other long term (current) drug therapy: Secondary | ICD-10-CM | POA: Diagnosis not present

## 2019-07-01 DIAGNOSIS — M5412 Radiculopathy, cervical region: Secondary | ICD-10-CM | POA: Diagnosis not present

## 2019-07-01 DIAGNOSIS — R69 Illness, unspecified: Secondary | ICD-10-CM | POA: Diagnosis not present

## 2019-07-01 DIAGNOSIS — F331 Major depressive disorder, recurrent, moderate: Secondary | ICD-10-CM | POA: Diagnosis not present

## 2019-07-07 DIAGNOSIS — R69 Illness, unspecified: Secondary | ICD-10-CM | POA: Diagnosis not present

## 2019-07-14 DIAGNOSIS — M5136 Other intervertebral disc degeneration, lumbar region: Secondary | ICD-10-CM | POA: Diagnosis not present

## 2019-07-14 DIAGNOSIS — G894 Chronic pain syndrome: Secondary | ICD-10-CM | POA: Diagnosis not present

## 2019-07-14 DIAGNOSIS — M47817 Spondylosis without myelopathy or radiculopathy, lumbosacral region: Secondary | ICD-10-CM | POA: Diagnosis not present

## 2019-07-14 DIAGNOSIS — M961 Postlaminectomy syndrome, not elsewhere classified: Secondary | ICD-10-CM | POA: Diagnosis not present

## 2019-07-21 ENCOUNTER — Other Ambulatory Visit: Payer: Self-pay | Admitting: Pain Medicine

## 2019-07-21 DIAGNOSIS — M79651 Pain in right thigh: Secondary | ICD-10-CM

## 2019-07-21 DIAGNOSIS — M545 Low back pain, unspecified: Secondary | ICD-10-CM

## 2019-07-30 DIAGNOSIS — M5136 Other intervertebral disc degeneration, lumbar region: Secondary | ICD-10-CM | POA: Diagnosis not present

## 2019-07-30 DIAGNOSIS — Z79891 Long term (current) use of opiate analgesic: Secondary | ICD-10-CM | POA: Diagnosis not present

## 2019-07-30 DIAGNOSIS — Z79899 Other long term (current) drug therapy: Secondary | ICD-10-CM | POA: Diagnosis not present

## 2019-07-30 DIAGNOSIS — M47817 Spondylosis without myelopathy or radiculopathy, lumbosacral region: Secondary | ICD-10-CM | POA: Diagnosis not present

## 2019-07-30 DIAGNOSIS — M79651 Pain in right thigh: Secondary | ICD-10-CM | POA: Diagnosis not present

## 2019-07-30 DIAGNOSIS — G894 Chronic pain syndrome: Secondary | ICD-10-CM | POA: Diagnosis not present

## 2019-08-04 DIAGNOSIS — R69 Illness, unspecified: Secondary | ICD-10-CM | POA: Diagnosis not present

## 2019-08-17 ENCOUNTER — Other Ambulatory Visit: Payer: Self-pay

## 2019-08-17 ENCOUNTER — Ambulatory Visit
Admission: RE | Admit: 2019-08-17 | Discharge: 2019-08-17 | Disposition: A | Discharge status: Home / Self Care | Payer: Medicare HMO | Source: Ambulatory Visit | Attending: Pain Medicine | Admitting: Pain Medicine

## 2019-08-17 DIAGNOSIS — M79609 Pain in unspecified limb: Secondary | ICD-10-CM | POA: Diagnosis not present

## 2019-08-17 DIAGNOSIS — M79651 Pain in right thigh: Secondary | ICD-10-CM

## 2019-08-17 DIAGNOSIS — M48061 Spinal stenosis, lumbar region without neurogenic claudication: Secondary | ICD-10-CM | POA: Diagnosis not present

## 2019-08-17 DIAGNOSIS — R200 Anesthesia of skin: Secondary | ICD-10-CM | POA: Diagnosis not present

## 2019-08-17 DIAGNOSIS — G8929 Other chronic pain: Secondary | ICD-10-CM

## 2019-08-17 DIAGNOSIS — M545 Low back pain: Secondary | ICD-10-CM | POA: Diagnosis not present

## 2019-08-18 DIAGNOSIS — R69 Illness, unspecified: Secondary | ICD-10-CM | POA: Diagnosis not present

## 2019-08-23 DIAGNOSIS — Z0100 Encounter for examination of eyes and vision without abnormal findings: Secondary | ICD-10-CM | POA: Diagnosis not present

## 2019-08-24 DIAGNOSIS — G8929 Other chronic pain: Secondary | ICD-10-CM | POA: Diagnosis not present

## 2019-08-24 DIAGNOSIS — M545 Low back pain: Secondary | ICD-10-CM | POA: Diagnosis not present

## 2019-08-30 DIAGNOSIS — M5136 Other intervertebral disc degeneration, lumbar region: Secondary | ICD-10-CM | POA: Diagnosis not present

## 2019-08-30 DIAGNOSIS — G894 Chronic pain syndrome: Secondary | ICD-10-CM | POA: Diagnosis not present

## 2019-08-30 DIAGNOSIS — M47817 Spondylosis without myelopathy or radiculopathy, lumbosacral region: Secondary | ICD-10-CM | POA: Diagnosis not present

## 2019-08-30 DIAGNOSIS — M79651 Pain in right thigh: Secondary | ICD-10-CM | POA: Diagnosis not present

## 2019-09-01 DIAGNOSIS — L600 Ingrowing nail: Secondary | ICD-10-CM | POA: Diagnosis not present

## 2019-09-15 DIAGNOSIS — M47817 Spondylosis without myelopathy or radiculopathy, lumbosacral region: Secondary | ICD-10-CM | POA: Diagnosis not present

## 2019-09-15 DIAGNOSIS — M545 Low back pain: Secondary | ICD-10-CM | POA: Diagnosis not present

## 2019-09-15 DIAGNOSIS — M961 Postlaminectomy syndrome, not elsewhere classified: Secondary | ICD-10-CM | POA: Diagnosis not present

## 2019-09-15 DIAGNOSIS — M5136 Other intervertebral disc degeneration, lumbar region: Secondary | ICD-10-CM | POA: Diagnosis not present

## 2019-09-16 DIAGNOSIS — I10 Essential (primary) hypertension: Secondary | ICD-10-CM | POA: Diagnosis not present

## 2019-09-16 DIAGNOSIS — M545 Low back pain: Secondary | ICD-10-CM | POA: Diagnosis not present

## 2019-09-27 DIAGNOSIS — M47817 Spondylosis without myelopathy or radiculopathy, lumbosacral region: Secondary | ICD-10-CM | POA: Diagnosis not present

## 2019-09-27 DIAGNOSIS — M5136 Other intervertebral disc degeneration, lumbar region: Secondary | ICD-10-CM | POA: Diagnosis not present

## 2019-09-27 DIAGNOSIS — M961 Postlaminectomy syndrome, not elsewhere classified: Secondary | ICD-10-CM | POA: Diagnosis not present

## 2019-09-27 DIAGNOSIS — G894 Chronic pain syndrome: Secondary | ICD-10-CM | POA: Diagnosis not present

## 2019-10-20 DIAGNOSIS — R69 Illness, unspecified: Secondary | ICD-10-CM | POA: Diagnosis not present

## 2019-10-27 DIAGNOSIS — M47817 Spondylosis without myelopathy or radiculopathy, lumbosacral region: Secondary | ICD-10-CM | POA: Diagnosis not present

## 2019-10-27 DIAGNOSIS — G894 Chronic pain syndrome: Secondary | ICD-10-CM | POA: Diagnosis not present

## 2019-10-27 DIAGNOSIS — Z79891 Long term (current) use of opiate analgesic: Secondary | ICD-10-CM | POA: Diagnosis not present

## 2019-10-27 DIAGNOSIS — M961 Postlaminectomy syndrome, not elsewhere classified: Secondary | ICD-10-CM | POA: Diagnosis not present

## 2019-10-27 DIAGNOSIS — Z79899 Other long term (current) drug therapy: Secondary | ICD-10-CM | POA: Diagnosis not present

## 2019-10-27 DIAGNOSIS — M5136 Other intervertebral disc degeneration, lumbar region: Secondary | ICD-10-CM | POA: Diagnosis not present

## 2019-11-10 DIAGNOSIS — R69 Illness, unspecified: Secondary | ICD-10-CM | POA: Diagnosis not present

## 2019-11-29 DIAGNOSIS — G894 Chronic pain syndrome: Secondary | ICD-10-CM | POA: Diagnosis not present

## 2019-11-29 DIAGNOSIS — Z01818 Encounter for other preprocedural examination: Secondary | ICD-10-CM | POA: Diagnosis not present

## 2019-12-01 DIAGNOSIS — M5136 Other intervertebral disc degeneration, lumbar region: Secondary | ICD-10-CM | POA: Diagnosis not present

## 2019-12-01 DIAGNOSIS — M5412 Radiculopathy, cervical region: Secondary | ICD-10-CM | POA: Diagnosis not present

## 2019-12-01 DIAGNOSIS — M961 Postlaminectomy syndrome, not elsewhere classified: Secondary | ICD-10-CM | POA: Diagnosis not present

## 2019-12-01 DIAGNOSIS — G894 Chronic pain syndrome: Secondary | ICD-10-CM | POA: Diagnosis not present

## 2019-12-21 DIAGNOSIS — Z008 Encounter for other general examination: Secondary | ICD-10-CM | POA: Diagnosis not present

## 2019-12-21 DIAGNOSIS — L309 Dermatitis, unspecified: Secondary | ICD-10-CM | POA: Diagnosis not present

## 2019-12-21 DIAGNOSIS — R69 Illness, unspecified: Secondary | ICD-10-CM | POA: Diagnosis not present

## 2019-12-21 DIAGNOSIS — G8929 Other chronic pain: Secondary | ICD-10-CM | POA: Diagnosis not present

## 2019-12-21 DIAGNOSIS — Z79891 Long term (current) use of opiate analgesic: Secondary | ICD-10-CM | POA: Diagnosis not present

## 2019-12-21 DIAGNOSIS — M2550 Pain in unspecified joint: Secondary | ICD-10-CM | POA: Diagnosis not present

## 2019-12-21 DIAGNOSIS — I10 Essential (primary) hypertension: Secondary | ICD-10-CM | POA: Diagnosis not present

## 2019-12-27 DIAGNOSIS — M5136 Other intervertebral disc degeneration, lumbar region: Secondary | ICD-10-CM | POA: Diagnosis not present

## 2019-12-27 DIAGNOSIS — M5412 Radiculopathy, cervical region: Secondary | ICD-10-CM | POA: Diagnosis not present

## 2019-12-27 DIAGNOSIS — G894 Chronic pain syndrome: Secondary | ICD-10-CM | POA: Diagnosis not present

## 2019-12-27 DIAGNOSIS — M79602 Pain in left arm: Secondary | ICD-10-CM | POA: Diagnosis not present

## 2019-12-30 DIAGNOSIS — I10 Essential (primary) hypertension: Secondary | ICD-10-CM | POA: Diagnosis not present

## 2019-12-30 DIAGNOSIS — R69 Illness, unspecified: Secondary | ICD-10-CM | POA: Diagnosis not present

## 2019-12-30 DIAGNOSIS — Z79899 Other long term (current) drug therapy: Secondary | ICD-10-CM | POA: Diagnosis not present

## 2019-12-30 DIAGNOSIS — Z125 Encounter for screening for malignant neoplasm of prostate: Secondary | ICD-10-CM | POA: Diagnosis not present

## 2019-12-30 DIAGNOSIS — F3341 Major depressive disorder, recurrent, in partial remission: Secondary | ICD-10-CM | POA: Diagnosis not present

## 2019-12-30 DIAGNOSIS — Z0000 Encounter for general adult medical examination without abnormal findings: Secondary | ICD-10-CM | POA: Diagnosis not present

## 2020-01-18 DIAGNOSIS — I10 Essential (primary) hypertension: Secondary | ICD-10-CM | POA: Diagnosis not present

## 2020-01-18 DIAGNOSIS — M5412 Radiculopathy, cervical region: Secondary | ICD-10-CM | POA: Diagnosis not present

## 2020-01-27 DIAGNOSIS — M5137 Other intervertebral disc degeneration, lumbosacral region: Secondary | ICD-10-CM | POA: Diagnosis not present

## 2020-01-27 DIAGNOSIS — M542 Cervicalgia: Secondary | ICD-10-CM | POA: Diagnosis not present

## 2020-01-27 DIAGNOSIS — Z79891 Long term (current) use of opiate analgesic: Secondary | ICD-10-CM | POA: Diagnosis not present

## 2020-01-27 DIAGNOSIS — G5600 Carpal tunnel syndrome, unspecified upper limb: Secondary | ICD-10-CM | POA: Diagnosis not present

## 2020-01-27 DIAGNOSIS — G894 Chronic pain syndrome: Secondary | ICD-10-CM | POA: Diagnosis not present

## 2020-01-27 DIAGNOSIS — Z79899 Other long term (current) drug therapy: Secondary | ICD-10-CM | POA: Diagnosis not present

## 2020-02-08 DIAGNOSIS — M5412 Radiculopathy, cervical region: Secondary | ICD-10-CM | POA: Diagnosis not present

## 2020-02-14 ENCOUNTER — Other Ambulatory Visit: Payer: Self-pay | Admitting: Neurological Surgery

## 2020-02-14 DIAGNOSIS — M5412 Radiculopathy, cervical region: Secondary | ICD-10-CM

## 2020-02-29 DIAGNOSIS — M5136 Other intervertebral disc degeneration, lumbar region: Secondary | ICD-10-CM | POA: Diagnosis not present

## 2020-02-29 DIAGNOSIS — M961 Postlaminectomy syndrome, not elsewhere classified: Secondary | ICD-10-CM | POA: Diagnosis not present

## 2020-02-29 DIAGNOSIS — G894 Chronic pain syndrome: Secondary | ICD-10-CM | POA: Diagnosis not present

## 2020-02-29 DIAGNOSIS — M5412 Radiculopathy, cervical region: Secondary | ICD-10-CM | POA: Diagnosis not present

## 2020-03-03 ENCOUNTER — Ambulatory Visit
Admission: EM | Admit: 2020-03-03 | Discharge: 2020-03-03 | Disposition: A | Discharge status: Home / Self Care | Payer: Medicare HMO | Attending: Family Medicine | Admitting: Family Medicine

## 2020-03-03 ENCOUNTER — Other Ambulatory Visit: Payer: Self-pay

## 2020-03-03 ENCOUNTER — Encounter: Payer: Self-pay | Admitting: Emergency Medicine

## 2020-03-03 DIAGNOSIS — K0889 Other specified disorders of teeth and supporting structures: Secondary | ICD-10-CM

## 2020-03-03 DIAGNOSIS — R220 Localized swelling, mass and lump, head: Secondary | ICD-10-CM

## 2020-03-03 DIAGNOSIS — K047 Periapical abscess without sinus: Secondary | ICD-10-CM | POA: Diagnosis not present

## 2020-03-03 MED ORDER — AMOXICILLIN-POT CLAVULANATE 875-125 MG PO TABS: 1.0000 | ORAL_TABLET | Freq: Two times a day (BID) | ORAL | 0 refills | Status: DC

## 2020-03-03 NOTE — Discharge Instructions (Signed)
I have sent in Augmentin for you to take twice a day for 7 days.  Follow up with this office or with primary care if symptoms are persisting.  Follow up in the ER for high fever, trouble swallowing, trouble breathing, other concerning symptoms.

## 2020-03-03 NOTE — ED Provider Notes (Signed)
[facility]    CSN: [id number] Arrival date & time: 03/03/20  1017      History   Chief Complaint Chief Complaint  Patient presents with  . Facial Pain    HPI Benjamin Solomon is a 55 y.o. male.   Reports left cheek swelling, pain and tenderness x 1 week. States that tenderness and pressure are worse today. Reports that he has a bad tooth on the L upper as well. Denies headache, cough, nausea, vomiting, diarrhea, rash, fever, other symptoms  ROS per HPI  The history is provided by the patient.    Past Medical History:  Diagnosis Date  . Anxiety   . Arthritis   . Benign prostatic hyperplasia with lower urinary tract symptoms   . Carpal tunnel syndrome, right upper limb   . Chronic back pain   . Depression   . Essential (primary) hypertension   . Hemorrhoids   . Hyperplastic colon polyp   . Hypertension   . Low back pain   . Major depressive disorder, single episode, unspecified   . Opioid dependence ([place])   . Tubular adenoma   . Unspecified osteoarthritis, unspecified site     Patient Active Problem List   Diagnosis Date Noted  . Essential (primary) hypertension   . Low back pain   . Major depressive disorder, single episode, unspecified   . Benign prostatic hyperplasia with lower urinary tract symptoms   . Carpal tunnel syndrome, right upper limb   . Unspecified osteoarthritis, unspecified site   . S/P lumbar spinal fusion 08/07/2016  . Generalized anxiety disorder 10/04/2012  . Alcohol dependence ([place]) 06/26/2012  . Cocaine dependence ([place]) 06/26/2012  . Depressive disorder, not elsewhere classified 06/26/2012  . Bleeding external hemorrhoids 12/17/2011    Past Surgical History:  Procedure Laterality Date  . CARPAL TUNNEL RELEASE Right 02/17/2018   Procedure: CARPAL TUNNEL RELEASE;  Surgeon: Daryll Brod, MD;  Location: [facility];  Service: Orthopedics;  Laterality: Right;  . CERVICAL SPINE SURGERY  2009  . COLONOSCOPY     . EVALUATION UNDER ANESTHESIA WITH HEMORRHOIDECTOMY  12/20/2011  . FOOT SURGERY Right 2012   bone removed from the 5 th toe       Home Medications    Prior to Admission medications   Medication Sig Start Date End Date Taking? Authorizing Provider  amoxicillin-clavulanate (AUGMENTIN) 875-125 MG tablet Take 1 tablet by mouth 2 (two) times daily for 7 days. 03/03/20 03/10/20 Yes Faustino Congress, NP  ALPRAZolam Duanne Moron) 0.5 MG tablet Take 0.5 mg by mouth at bedtime as needed for sleep.  06/02/16   [provider]  amLODipine (NORVASC) 5 MG tablet Take 5 mg by mouth daily as needed (for high blood pressure).  01/27/13   [provider]  buPROPion (WELLBUTRIN XL) 300 MG 24 hr tablet Take 300 mg by mouth daily. 05/18/16   [provider]  clindamycin (CLEOCIN T) 1 % external solution Apply 1 application topically 2 (two) times daily as needed (boils).  10/13/15   [provider]  clindamycin (CLEOCIN) 300 MG capsule Take 300 mg by mouth 2 (two) times daily as needed (boils).  10/12/15   [provider]  cyclobenzaprine (FLEXERIL) 5 MG tablet Take 1 tablet (5 mg total) by mouth 3 (three) times daily as needed for muscle spasms. 06/04/18   Evalee Jefferson, PA-C  docusate sodium (COLACE) 100 MG capsule Take 200 mg by mouth daily.    [provider]  FLUoxetine (PROZAC)  20 MG tablet Take 20 mg by mouth daily.    [provider]  lisinopril-hydrochlorothiazide (PRINZIDE,ZESTORETIC) 20-12.5 MG per tablet Take 1 tablet by mouth daily.    [provider]  methocarbamol (ROBAXIN) 500 MG tablet Take 500 mg by mouth 3 (three) times daily.    [provider]  oxyCODONE-acetaminophen (PERCOCET) 10-325 MG tablet Take 1 tablet by mouth every 4 (four) hours as needed for pain. 08/08/16   Eustace Moore, MD  OXYCONTIN 20 MG 12 hr tablet Take 20 mg by mouth every 12 (twelve) hours.  06/25/16   [provider]  tamsulosin (FLOMAX) 0.4 MG  CAPS capsule Take 0.4 mg by mouth daily.    [provider]    Family History Family History  Problem Relation Age of Onset  . Hypertension Mother   . Hypertension Father   . Alcohol abuse Brother   . Drug abuse Brother   . Diabetes Neg Hx   . Heart disease Neg Hx   . Kidney disease Neg Hx   . Liver disease Neg Hx     Social History Social History   Tobacco Use  . Smoking status: Current Every Day Smoker    Packs/day: 0.25    Years: 20.00    Pack years: 5.00    Types: Cigarettes  . Smokeless tobacco: Never Used  Vaping Use  . Vaping Use: Never used  Substance Use Topics  . Alcohol use: No    Alcohol/week: 0.0 standard drinks  . Drug use: No     Allergies   Patient has no known allergies.   Review of Systems Review of Systems   Physical Exam Triage Vital Signs ED Triage Vitals  Enc Vitals Group     BP 03/03/20 1029 116/78     Pulse Rate 03/03/20 1029 89     Resp 03/03/20 1029 18     Temp 03/03/20 1029 98.3 F (36.8 C)     Temp Source 03/03/20 1029 Oral     SpO2 03/03/20 1029 97 %     Weight --      Height --      Head Circumference --      Peak Flow --      Pain Score 03/03/20 1032 4     Pain Loc --      Pain Edu? --      Excl. in [place]? --    No data found.  Updated Vital Signs BP 116/78   Pulse 89   Temp 98.3 F (36.8 C) (Oral)   Resp 18   SpO2 97%      Physical Exam Vitals and nursing note reviewed.  Constitutional:      Appearance: He is well-developed and well-nourished.  HENT:     Head: Atraumatic.      Comments: Area of swelling, warmth and TTP    Right Ear: Tympanic membrane, ear canal and external ear normal.     Left Ear: Tympanic membrane, ear canal and external ear normal.     Nose: Nose normal.     Mouth/Throat:     Mouth: Mucous membranes are moist.     Pharynx: Oropharynx is clear.      Comments: Area of caries, mild erythema, no area of fluctuance, no drainage  Eyes:     Extraocular Movements: Extraocular  movements intact.     Conjunctiva/sclera: Conjunctivae normal.     Pupils: Pupils are equal, round, and reactive to light.  Cardiovascular:  Rate and Rhythm: Normal rate and regular rhythm.     Heart sounds: No murmur heard.   Pulmonary:     Effort: Pulmonary effort is normal. No respiratory distress.     Breath sounds: Normal breath sounds.  Abdominal:     Palpations: Abdomen is soft.     Tenderness: There is no abdominal tenderness.  Musculoskeletal:        General: No edema. Normal range of motion.     Cervical back: Neck supple.  Skin:    General: Skin is warm and dry.     Capillary Refill: Capillary refill takes less than 2 seconds.  Neurological:     General: No focal deficit present.     Mental Status: He is alert and oriented to person, place, and time.  Psychiatric:        Mood and Affect: Mood and affect and mood normal.        Behavior: Behavior normal.        Thought Content: Thought content normal.      UC Treatments / Results  Labs (all labs ordered are listed, but only abnormal results are displayed) Labs Reviewed - No data to display  EKG   Radiology No results found.  Procedures Procedures (including critical care time)  Medications Ordered in UC Medications - No data to display  Initial Impression / Assessment and Plan / UC Course  I have reviewed the triage vital signs and the nursing notes.  Pertinent labs & imaging results that were available during my care of the patient were reviewed by me and considered in my medical decision making (see chart for details).    Left Facial Swelling Dental Pain Dental Infection  Prescribed Augmentin BID x 7 days Follow up with dentist once antibiotics are completed Follow up with the ER for high fever, trouble swallowing, trouble breathing, other concerning symptoms  Final Clinical Impressions(s) / UC Diagnoses   Final diagnoses:  Left facial swelling  Dental infection  Pain, dental      Discharge Instructions     I have sent in Augmentin for you to take twice a day for 7 days.  Follow up with this office or with primary care if symptoms are persisting.  Follow up in the ER for high fever, trouble swallowing, trouble breathing, other concerning symptoms.     ED Prescriptions    Medication Sig Dispense Auth. Provider   amoxicillin-clavulanate (AUGMENTIN) 875-125 MG tablet Take 1 tablet by mouth 2 (two) times daily for 7 days. 14 tablet Faustino Congress, NP     PDMP not reviewed this encounter.   Faustino Congress, NP 03/03/20 1047

## 2020-03-03 NOTE — ED Triage Notes (Signed)
Swelling and pressure to LT side of face x 1 week.  Pt states he thinks he has a sinus infection.

## 2020-03-05 ENCOUNTER — Encounter (HOSPITAL_COMMUNITY): Payer: Self-pay | Admitting: *Deleted

## 2020-03-05 ENCOUNTER — Other Ambulatory Visit: Payer: Self-pay

## 2020-03-05 ENCOUNTER — Ambulatory Visit
Admission: EM | Admit: 2020-03-05 | Discharge: 2020-03-05 | Disposition: A | Discharge status: Home / Self Care | Payer: Medicare HMO

## 2020-03-05 ENCOUNTER — Emergency Department (HOSPITAL_COMMUNITY): Payer: Medicare HMO

## 2020-03-05 ENCOUNTER — Emergency Department (HOSPITAL_COMMUNITY)
Admission: EM | Admit: 2020-03-05 | Discharge: 2020-03-05 | Disposition: A | Discharge status: Home / Self Care | Payer: Medicare HMO | Attending: Emergency Medicine | Admitting: Emergency Medicine

## 2020-03-05 DIAGNOSIS — R69 Illness, unspecified: Secondary | ICD-10-CM | POA: Diagnosis not present

## 2020-03-05 DIAGNOSIS — I10 Essential (primary) hypertension: Secondary | ICD-10-CM | POA: Insufficient documentation

## 2020-03-05 DIAGNOSIS — R519 Headache, unspecified: Secondary | ICD-10-CM | POA: Diagnosis not present

## 2020-03-05 DIAGNOSIS — F17210 Nicotine dependence, cigarettes, uncomplicated: Secondary | ICD-10-CM | POA: Diagnosis not present

## 2020-03-05 DIAGNOSIS — Z79899 Other long term (current) drug therapy: Secondary | ICD-10-CM | POA: Diagnosis not present

## 2020-03-05 DIAGNOSIS — R220 Localized swelling, mass and lump, head: Secondary | ICD-10-CM | POA: Diagnosis present

## 2020-03-05 DIAGNOSIS — L03211 Cellulitis of face: Secondary | ICD-10-CM

## 2020-03-05 LAB — I-STAT CHEM 8, ED
BUN: 8 mg/dL (ref 6–20)
Calcium, Ion: 1.23 mmol/L (ref 1.15–1.40)
Chloride: 100 mmol/L (ref 98–111)
Creatinine, Ser: 1.1 mg/dL (ref 0.61–1.24)
Glucose, Bld: 104 mg/dL — ABNORMAL HIGH (ref 70–99)
HCT: 39 % (ref 39.0–52.0)
Hemoglobin: 13.3 g/dL (ref 13.0–17.0)
Potassium: 4 mmol/L (ref 3.5–5.1)
Sodium: 139 mmol/L (ref 135–145)
TCO2: 29 mmol/L (ref 22–32)

## 2020-03-05 MED ORDER — DOXYCYCLINE HYCLATE 100 MG PO CAPS: 100.0000 mg | ORAL_CAPSULE | Freq: Two times a day (BID) | ORAL | 0 refills | Status: DC

## 2020-03-05 MED ORDER — CLINDAMYCIN PHOSPHATE 600 MG/50ML IV SOLN
600.0000 mg | Freq: Once | INTRAVENOUS | Status: AC
  Administered 2020-03-05: 600 mg via INTRAVENOUS
  Filled 2020-03-05: qty 50

## 2020-03-05 MED ORDER — IOHEXOL 300 MG/ML  SOLN
75.0000 mL | Freq: Once | INTRAMUSCULAR | Status: AC | PRN
  Administered 2020-03-05: 75 mL via INTRAVENOUS

## 2020-03-05 MED ORDER — OXYCODONE-ACETAMINOPHEN 5-325 MG PO TABS
1.0000 | ORAL_TABLET | Freq: Once | ORAL | Status: AC
  Administered 2020-03-05: 1 via ORAL
  Filled 2020-03-05: qty 1

## 2020-03-05 NOTE — ED Triage Notes (Signed)
Pt currently on antibiotics for a sinus infection since Thursday.  Pt woke up with left facial swelling and went to Urgent Care, who sent pt here for possible CT.

## 2020-03-05 NOTE — ED Notes (Signed)
Pt sitting up in bed watching tv, pt states that his pain is a 9/10, states that he doesn't need anything for pain at this time, states that he took his pain med before coming to the er and it is still in his system

## 2020-03-05 NOTE — ED Notes (Signed)
Patient is being discharged from the Urgent Care and sent to the Emergency Department via POV. Per Faustino Congress , patient is in need of higher level of care due to need for imaging and iv antibiotics. Patient is aware and verbalizes understanding of plan of care. There were no vitals filed for this visit.

## 2020-03-05 NOTE — Discharge Instructions (Addendum)
You have a cellulitis of your face.  The CT today did not show evidence of an abscess.  Discontinue the Augmentin previously prescribed and start doxycycline tomorrow.  Follow-up with your primary care provider for recheck in 2 to 3 days.  Return to the emergency department for any worsening symptoms.

## 2020-03-05 NOTE — ED Notes (Signed)
Pt in bed, pt has swelling to L face, states that he has been treated for a sinus infection, states that this am he awoke and had the swelling to his face, pt has small red pimple like area with some surrounding induration, reports that it is tender to the touch, resps even and unlabored

## 2020-03-05 NOTE — ED Triage Notes (Signed)
Pt presents with complaints of worsening swelling to the left side of his face. Pt has been on antibiotics x 3 days. Swelling is significant to left side of face. No airway involvement. Faustino Congress at bedside to assess need to go to ER for IV antibiotics.

## 2020-03-07 NOTE — ED Provider Notes (Signed)
[facility] EMERGENCY DEPARTMENT Provider Note   CSN: [id number] Arrival date & time: 03/05/20  1132     History Chief Complaint  Patient presents with  . Facial Swelling    Benjamin Solomon is a 55 y.o. male.  HPI      Benjamin Solomon is a 55 y.o. male who presents to the Emergency Department complaining of gradually worsening left facial swelling.  Symptoms have been present for several days.  He was seen at a local urgent care for sinus pressure, given Augmentin for sinus infection.  He noticed gradual swelling of the left face along the side of his nose into his cheek.  He returned to urgent care for follow-up and was advised to come to the ER for further evaluation.  He denies fever, chills, difficulty swallowing, neck pain, nasal congestion or rhinorrhea.  No headache or dizziness.  He denies any trauma of his face or dental pain    Past Medical History:  Diagnosis Date  . Anxiety   . Arthritis   . Benign prostatic hyperplasia with lower urinary tract symptoms   . Carpal tunnel syndrome, right upper limb   . Chronic back pain   . Depression   . Essential (primary) hypertension   . Hemorrhoids   . Hyperplastic colon polyp   . Hypertension   . Low back pain   . Major depressive disorder, single episode, unspecified   . Opioid dependence ([place])   . Tubular adenoma   . Unspecified osteoarthritis, unspecified site     Patient Active Problem List   Diagnosis Date Noted  . Essential (primary) hypertension   . Low back pain   . Major depressive disorder, single episode, unspecified   . Benign prostatic hyperplasia with lower urinary tract symptoms   . Carpal tunnel syndrome, right upper limb   . Unspecified osteoarthritis, unspecified site   . S/P lumbar spinal fusion 08/07/2016  . Generalized anxiety disorder 10/04/2012  . Alcohol dependence ([place]) 06/26/2012  . Cocaine dependence ([place]) 06/26/2012  . Depressive disorder, not elsewhere classified 06/26/2012   . Bleeding external hemorrhoids 12/17/2011    Past Surgical History:  Procedure Laterality Date  . CARPAL TUNNEL RELEASE Right 02/17/2018   Procedure: CARPAL TUNNEL RELEASE;  Surgeon: Daryll Brod, MD;  Location: [facility];  Service: Orthopedics;  Laterality: Right;  . CERVICAL SPINE SURGERY  2009  . COLONOSCOPY    . EVALUATION UNDER ANESTHESIA WITH HEMORRHOIDECTOMY  12/20/2011  . FOOT SURGERY Right 2012   bone removed from the 5 th toe       Family History  Problem Relation Age of Onset  . Hypertension Mother   . Hypertension Father   . Alcohol abuse Brother   . Drug abuse Brother   . Diabetes Neg Hx   . Heart disease Neg Hx   . Kidney disease Neg Hx   . Liver disease Neg Hx     Social History   Tobacco Use  . Smoking status: Current Every Day Smoker    Packs/day: 0.25    Years: 20.00    Pack years: 5.00    Types: Cigarettes  . Smokeless tobacco: Never Used  Vaping Use  . Vaping Use: Never used  Substance Use Topics  . Alcohol use: No    Alcohol/week: 0.0 standard drinks  . Drug use: No    Home Medications Prior to Admission medications   Medication Sig Start Date End Date Taking? Authorizing Provider  doxycycline (VIBRAMYCIN) 100 MG  capsule Take 1 capsule (100 mg total) by mouth 2 (two) times daily. 03/05/20  Yes Triplett, Tammy, PA-C  ALPRAZolam (XANAX) 0.5 MG tablet Take 0.5 mg by mouth at bedtime as needed for sleep.  06/02/16   [provider]  amLODipine (NORVASC) 5 MG tablet Take 5 mg by mouth daily as needed (for high blood pressure).  01/27/13   [provider]  buPROPion (WELLBUTRIN XL) 300 MG 24 hr tablet Take 300 mg by mouth daily. 05/18/16   [provider]  clindamycin (CLEOCIN T) 1 % external solution Apply 1 application topically 2 (two) times daily as needed (boils).  10/13/15   [provider]  clindamycin (CLEOCIN) 300 MG capsule Take 300 mg by mouth 2 (two) times daily as needed (boils).  10/12/15    [provider]  cyclobenzaprine (FLEXERIL) 5 MG tablet Take 1 tablet (5 mg total) by mouth 3 (three) times daily as needed for muscle spasms. 06/04/18   Evalee Jefferson, PA-C  docusate sodium (COLACE) 100 MG capsule Take 200 mg by mouth daily.    [provider]  FLUoxetine (PROZAC) 20 MG tablet Take 20 mg by mouth daily.    [provider]  lisinopril-hydrochlorothiazide (PRINZIDE,ZESTORETIC) 20-12.5 MG per tablet Take 1 tablet by mouth daily.    [provider]  methocarbamol (ROBAXIN) 500 MG tablet Take 500 mg by mouth 3 (three) times daily.    [provider]  oxyCODONE-acetaminophen (PERCOCET) 10-325 MG tablet Take 1 tablet by mouth every 4 (four) hours as needed for pain. 08/08/16   Eustace Moore, MD  OXYCONTIN 20 MG 12 hr tablet Take 20 mg by mouth every 12 (twelve) hours.  06/25/16   [provider]  tamsulosin (FLOMAX) 0.4 MG CAPS capsule Take 0.4 mg by mouth daily.    [provider]    Allergies    Patient has no known allergies.  Review of Systems   Review of Systems  Constitutional: Negative for chills, fatigue and fever.  HENT: Positive for facial swelling. Negative for congestion, dental problem, ear pain, rhinorrhea, sinus pressure, sore throat and trouble swallowing.   Eyes: Negative for pain, redness and visual disturbance.  Respiratory: Negative for cough and shortness of breath.   Cardiovascular: Negative for chest pain.  Gastrointestinal: Negative for abdominal pain, nausea and vomiting.  Musculoskeletal: Negative for arthralgias, myalgias, neck pain and neck stiffness.  Skin: Negative for rash.  Neurological: Negative for dizziness, speech difficulty, weakness, numbness and headaches.  Hematological: Does not bruise/bleed easily.    Physical Exam Updated Vital Signs BP 134/90 (BP Location: Left Arm)   Pulse 84   Temp 98.1 F (36.7 C) (Oral)   Resp 18   Ht 5\' 8"  (1.727 m)   Wt 72.6 kg   SpO2 99%   BMI  24.33 kg/m   Physical Exam Vitals and nursing note reviewed.  Constitutional:      General: He is not in acute distress.    Appearance: Normal appearance. He is not ill-appearing.  HENT:     Head:     Jaw: There is normal jaw occlusion. No trismus, pain on movement or malocclusion.     Comments: Moderate edema noted along the left nasolabial fold extending to the left cheek.  Mild induration noted, no fluctuance or significant erythema.  No pustules, vesicles or rash.  No periorbital tenderness or edema.  No tenderness left upper teeth or oral lesions    Right Ear: Tympanic membrane and ear canal  normal.     Left Ear: Tympanic membrane and ear canal normal.     Nose: Nose normal. No rhinorrhea.     Mouth/Throat:     Mouth: Mucous membranes are moist.     Pharynx: Oropharynx is clear. No oropharyngeal exudate or posterior oropharyngeal erythema.     Comments: Uvula midline and nonedematous.  No trismus Eyes:     Extraocular Movements: Extraocular movements intact.     Conjunctiva/sclera: Conjunctivae normal.     Pupils: Pupils are equal, round, and reactive to light.  Cardiovascular:     Rate and Rhythm: Normal rate and regular rhythm.     Pulses: Normal pulses.  Pulmonary:     Effort: Pulmonary effort is normal.     Breath sounds: Normal breath sounds.  Musculoskeletal:        General: Normal range of motion.     Cervical back: Normal range of motion. No tenderness.  Lymphadenopathy:     Cervical: No cervical adenopathy.  Skin:    General: Skin is warm.     Capillary Refill: Capillary refill takes less than 2 seconds.     Findings: No rash.  Neurological:     General: No focal deficit present.     Mental Status: He is alert.     Sensory: No sensory deficit.     Motor: No weakness.     ED Results / Procedures / Treatments   Labs (all labs ordered are listed, but only abnormal results are displayed) Labs Reviewed  I-STAT CHEM 8, ED - Abnormal; Notable for the following  components:      Result Value   Glucose, Bld 104 (*)    All other components within normal limits    EKG None  Radiology CT Maxillofacial W Contrast  Result Date: 03/05/2020 CLINICAL DATA:  Left facial pain and swelling for 4 days ago. EXAM: CT MAXILLOFACIAL WITH CONTRAST TECHNIQUE: Multidetector CT imaging of the maxillofacial structures was performed with intravenous contrast. Multiplanar CT image reconstructions were also generated. CONTRAST:  49mL OMNIPAQUE IOHEXOL 300 MG/ML  SOLN COMPARISON:  Head CT 09/09/2006 FINDINGS: Osseous: No fracture or destructive osseous process. Multiple missing teeth and dental caries including a prominent cavity involving the left maxillary canine. Orbits: Grossly intact globes.  No postseptal inflammation or mass. Sinuses: Paranasal sinuses and mastoid air cells are clear. Soft tissues: Moderate thickening of the soft tissues anterior to the left maxilla extending superiorly to the lower eyelid and inferiorly lateral to the mandible. No fluid collection. Mild bilateral submandibular gland ductal ectasia. Punctate calcifications in both parotid glands. Limited intracranial: Unremarkable. IMPRESSION: Left facial swelling which may reflect cellulitis.  No abscess. Electronically Signed   By: Logan Bores M.D.   On: 03/05/2020 14:59     Procedures Procedures   Medications Ordered in ED Medications  iohexol (OMNIPAQUE) 300 MG/ML solution 75 mL (75 mLs Intravenous Contrast Given 03/05/20 1349)  clindamycin (CLEOCIN) IVPB 600 mg (0 mg Intravenous Stopped 03/05/20 1616)  oxyCODONE-acetaminophen (PERCOCET/ROXICET) 5-325 MG per tablet 1 tablet (1 tablet Oral Given 03/05/20 1559)    ED Course  I have reviewed the triage vital signs and the nursing notes.  Pertinent labs & imaging results that were available during my care of the patient were reviewed by me and considered in my medical decision making (see chart for details).    MDM Rules/Calculators/A&P  Patient sent here from local urgent care for evaluation of swelling of the left face.  Symptoms present for several days.  No airway involvement or oral lesions.  No dental tenderness to suggest periapical abscess.  Overall patient well-appearing nontoxic.  No involvement of the neck.  Contrasted CT maxillofacial shows edema and likely cellulitis without findings to suggest abscess.  Patient has been taking Augmentin for 3 days without relief.  We will have him discontinue Augmentin and start doxycycline.  He was given IV clindamycin here.  He has pain medication at home.  He appears appropriate for discharge home, agrees to close follow-up with PCP.  I have also given strict return precautions.   Final Clinical Impression(s) / ED Diagnoses Final diagnoses:  Facial cellulitis    Rx / DC Orders ED Discharge Orders         Ordered    doxycycline (VIBRAMYCIN) 100 MG capsule  2 times daily        03/05/20 1704           Kem Parkinson, PA-C 03/07/20 1419    Horton, Alvin Critchley, DO 03/09/20 1546

## 2020-03-11 ENCOUNTER — Other Ambulatory Visit: Payer: Self-pay

## 2020-03-11 ENCOUNTER — Ambulatory Visit
Admission: RE | Admit: 2020-03-11 | Discharge: 2020-03-11 | Disposition: A | Discharge status: Home / Self Care | Payer: Medicare HMO | Source: Ambulatory Visit | Attending: Neurological Surgery | Admitting: Neurological Surgery

## 2020-03-11 DIAGNOSIS — M4802 Spinal stenosis, cervical region: Secondary | ICD-10-CM | POA: Diagnosis not present

## 2020-03-11 DIAGNOSIS — M5412 Radiculopathy, cervical region: Secondary | ICD-10-CM

## 2020-03-27 DIAGNOSIS — R040 Epistaxis: Secondary | ICD-10-CM | POA: Diagnosis not present

## 2020-03-27 DIAGNOSIS — L03211 Cellulitis of face: Secondary | ICD-10-CM | POA: Diagnosis not present

## 2020-03-29 DIAGNOSIS — M5136 Other intervertebral disc degeneration, lumbar region: Secondary | ICD-10-CM | POA: Diagnosis not present

## 2020-03-29 DIAGNOSIS — G894 Chronic pain syndrome: Secondary | ICD-10-CM | POA: Diagnosis not present

## 2020-03-29 DIAGNOSIS — M47817 Spondylosis without myelopathy or radiculopathy, lumbosacral region: Secondary | ICD-10-CM | POA: Diagnosis not present

## 2020-03-29 DIAGNOSIS — M961 Postlaminectomy syndrome, not elsewhere classified: Secondary | ICD-10-CM | POA: Diagnosis not present

## 2020-04-06 DIAGNOSIS — M5412 Radiculopathy, cervical region: Secondary | ICD-10-CM | POA: Diagnosis not present

## 2020-04-21 ENCOUNTER — Encounter (HOSPITAL_COMMUNITY): Payer: Self-pay | Admitting: Physical Therapy

## 2020-04-21 ENCOUNTER — Other Ambulatory Visit: Payer: Self-pay

## 2020-04-21 ENCOUNTER — Ambulatory Visit (HOSPITAL_COMMUNITY): Payer: Medicare HMO | Attending: Neurological Surgery | Admitting: Physical Therapy

## 2020-04-21 DIAGNOSIS — M542 Cervicalgia: Secondary | ICD-10-CM | POA: Diagnosis not present

## 2020-04-21 DIAGNOSIS — M6281 Muscle weakness (generalized): Secondary | ICD-10-CM | POA: Diagnosis not present

## 2020-04-21 NOTE — Therapy (Signed)
[place], [place], [address] Phone: [phone]   Fax:  [phone]  Physical Therapy Evaluation  Patient Details  Name: Benjamin Solomon MRN: [id number] Date of Birth: 1965/02/06 Referring Provider (PT): Sherley Bounds, MD   Encounter Date: 04/21/2020   PT End of Session - 04/21/20 1318    Visit Number 1    Number of Visits 12    Date for PT Re-Evaluation 06/02/20    Authorization Type aetna medicare HMO- visits med [employer], no auth    Progress Note Due on Visit 10    PT Start Time 1319    PT Stop Time 1355    PT Time Calculation (min) 36 min           Past Medical History:  Diagnosis Date  . Anxiety   . Arthritis   . Benign prostatic hyperplasia with lower urinary tract symptoms   . Carpal tunnel syndrome, right upper limb   . Chronic back pain   . Depression   . Essential (primary) hypertension   . Hemorrhoids   . Hyperplastic colon polyp   . Hypertension   . Low back pain   . Major depressive disorder, single episode, unspecified   . Opioid dependence ([place])   . Tubular adenoma   . Unspecified osteoarthritis, unspecified site     Past Surgical History:  Procedure Laterality Date  . CARPAL TUNNEL RELEASE Right 02/17/2018   Procedure: CARPAL TUNNEL RELEASE;  Surgeon: Daryll Brod, MD;  Location: [facility];  Service: Orthopedics;  Laterality: Right;  . CERVICAL SPINE SURGERY  2009  . COLONOSCOPY    . EVALUATION UNDER ANESTHESIA WITH HEMORRHOIDECTOMY  12/20/2011  . FOOT SURGERY Right 2012   bone removed from the 5 th toe    There were no vitals filed for this visit.    Subjective Assessment - 04/21/20 1326    Subjective States he has been having some neck pain for about 6 months and this has gotten worse. States that the pain radiates into both arms with the left worse then the right. States that he has had problems with his neck with and has had 2 surgeries on his neck one in 2009 and  one in 2018 both fusions. States this is the first time he has had numbness in his arms and this extends down both arms and into front and back of hands. States that sleeping/laying down makes his symptoms worse, changing positions help with his symptoms. Current pain level is 7/10 described as sharp shooting and right at the base of the neck (points to C7).    Pertinent History C4-C6 ACDF 2009, another fusion below initial fusion  2018  depression    Diagnostic tests MRI and xray    Patient Stated Goals would like to get back down to baseline pain of 3/10 and no radicular symptoms    Currently in Pain? Yes    Pain Score 7     Pain Location Neck    Pain Orientation Mid;Posterior    Pain Descriptors / Indicators Shooting;Sharp    Pain Type Chronic pain    Pain Radiating Towards into arms and hands with left worse then right.    Pain Onset More than a month ago    Pain Frequency Constant    Pain Relieving Factors moving              [facility] PT Assessment - 04/21/20 0001  Assessment   Medical Diagnosis cervical radiculopathy    Referring Provider (PT) Sherley Bounds, MD    Next MD Visit 06/03/20      Balance Screen   Has the patient fallen in the past 6 months No      [place] residence    Living Arrangements Children;Spouse/significant other    Available Help at Discharge Family      Prior Function   Level of Independence Independent with community mobility with device      Cognition   Overall Cognitive Status Within Functional Limits for tasks assessed      Observation/Other Assessments   Observations resting sitting position with left shoulder shrugged and forward head. Very stiff movements.    Focus on Therapeutic Outcomes (FOTO)  35% function      ROM / Strength   AROM / PROM / Strength AROM      AROM   AROM Assessment Site Cervical;Shoulder    Right/Left Shoulder Left;Right    Right Shoulder Flexion 160 Degrees   tightness along  back of neck   Right Shoulder Internal Rotation --   reaches to T12 SP - painful in neck and mid back   Right Shoulder External Rotation --   reaches to T1 SP - pain in neck and back   Left Shoulder Flexion 150 Degrees   tightness along back of neck   Left Shoulder Internal Rotation --   reaches to T12 SP - painful in neck and mid back   Left Shoulder External Rotation --   reaches to left scpular border upper - pain in neck and back   Cervical Flexion 34   pulling   Cervical Extension 29   tightness   Cervical - Right Side Bend 22   tightness on ipsilateral side   Cervical - Left Side Bend 24   tightness on ipsilateral side   Cervical - Right Rotation 40   tightness along back of neck   Cervical - Left Rotation 35   pain on left side of lower neck     Palpation   Spinal mobility not tested    Palpation comment tenderness and triggerpoints noted in bilateral upper traps, cervical paraspinals. UT and rotator cuff muscles referred pain into arms      Special Tests    Special Tests Thoracic Outlet Syndrome    Thoracic Outlet Syndrome  Arms overhead      Arms overhead    Findings Postive    Side --   bialteral - numbness presented                     Objective measurements completed on examination: See above findings.       [facility] Adult PT Treatment/Exercise - 04/21/20 0001      Exercises   Exercises Neck      Neck Exercises: Standing   Other Standing Exercises self mobilization with tennis ball to thoraicc and lumbar musculature - light pressure - 3 minutes                  PT Education - 04/21/20 1349    Education Details on DN, on POC, HEP    Person(s) Educated Patient    Methods Explanation    Comprehension Verbalized understanding            PT Short Term Goals - 04/21/20 1401      PT SHORT TERM GOAL #1   Title Patient  will be independent in self management strategies to improve quality of life and functional outcomes.    Time 3    Period  Weeks    Status New    Target Date 05/12/20      PT SHORT TERM GOAL #2   Title Patient will be able to reache behind back without severe pain in neck    Time 3    Period Weeks    Status New    Target Date 05/12/20      PT SHORT TERM GOAL #3   Title Patient will report at least 25% improvement in overall symptoms and/or function to demonstrate improved functional mobility    Time 3    Period Weeks    Status New    Target Date 05/12/20             PT Long Term Goals - 04/21/20 1402      PT LONG TERM GOAL #1   Title Patient will be able to stand and wash dishes without needing to stop secondary to severe pain to demonstrate improved function at home.    Time 6    Period Weeks    Status New    Target Date 06/02/20      PT LONG TERM GOAL #2   Title Patient will improve on FOTO score to meet predicted outcomes to demonstrate improved functional mobility.    Time 6    Period Weeks    Status New    Target Date 06/02/20      PT LONG TERM GOAL #3   Title Patient will report at least 50% improvement in overall symptoms and/or function to demonstrate improved functional mobility    Time 6    Period Weeks    Status New    Target Date 06/02/20                  Plan - 04/21/20 1359    Clinical Impression Statement Patient presents to therapy with complaints of chronic neck pain with recent exacerbation of symptoms about 6 months ago with no known cause with symptoms radiating into both arms and hands on both side. Patient with history of 2 cervical fusions. one in 2009 and one in 2018.  Patient presents with tenderness throughout spinal musculature and with referred pain into arms with palpation of spinal musculature. Patient would benefit from skilled physical therapy focusing on improving thoracic mobility, reducing referred symptoms and improving overall function.    Personal Factors and Comorbidities Comorbidity 1;Comorbidity 2    Comorbidities C4-C6 ACDF 2009, another  fusion below initial fusion  2018  depression    Examination-Activity Limitations Lift;Reach Overhead;Dressing;Stand;Sleep;Bed Mobility;Bend    Examination-Participation Restrictions Occupation;Meal Prep;Yard Work;Community Activity;Cleaning    Stability/Clinical Decision Making Stable/Uncomplicated    Clinical Decision Making Low    Rehab Potential Fair    PT Frequency 2x / week    PT Duration 6 weeks    PT Treatment/Interventions ADLs/Self Care Home Management;Aquatic Therapy;Moist Heat;Electrical Stimulation;Cryotherapy;Therapeutic activities;Therapeutic exercise;Manual techniques;Neuromuscular re-education;Dry needling;Passive range of motion;Patient/family education    PT Next Visit Plan thoracic mobilization, STM to spinal musculature, Shoulder ROM, trial DN    PT Home Exercise Plan self mobilizaiton to muscles with tennis ball at wall    Consulted and Agree with Plan of Care Patient           Patient will benefit from skilled therapeutic intervention in order to improve the following deficits and impairments:  Pain,Decreased activity tolerance,Decreased range of  motion,Postural dysfunction,Decreased strength  Visit Diagnosis: Cervicalgia  Muscle weakness (generalized)     Problem List Patient Active Problem List   Diagnosis Date Noted  . Essential (primary) hypertension   . Low back pain   . Major depressive disorder, single episode, unspecified   . Benign prostatic hyperplasia with lower urinary tract symptoms   . Carpal tunnel syndrome, right upper limb   . Unspecified osteoarthritis, unspecified site   . S/P lumbar spinal fusion 08/07/2016  . Generalized anxiety disorder 10/04/2012  . Alcohol dependence ([place]) 06/26/2012  . Cocaine dependence ([place]) 06/26/2012  . Depressive disorder, not elsewhere classified 06/26/2012  . Bleeding external hemorrhoids 12/17/2011   2:26 PM, 04/21/20 Jerene Pitch, DPT Physical Therapy with [facility]   [phone] office  [place] [address] [place], [place], [address] Phone: [phone]   Fax:  [phone]  Name: Benjamin Solomon MRN: [id number] Date of Birth: 1965-04-15

## 2020-04-21 NOTE — Patient Instructions (Signed)

## 2020-04-24 ENCOUNTER — Encounter (HOSPITAL_COMMUNITY): Payer: Self-pay | Admitting: Physical Therapy

## 2020-04-24 ENCOUNTER — Ambulatory Visit (HOSPITAL_COMMUNITY): Payer: Medicare HMO | Admitting: Physical Therapy

## 2020-04-24 ENCOUNTER — Other Ambulatory Visit: Payer: Self-pay

## 2020-04-24 DIAGNOSIS — M542 Cervicalgia: Secondary | ICD-10-CM

## 2020-04-24 DIAGNOSIS — M6281 Muscle weakness (generalized): Secondary | ICD-10-CM

## 2020-04-24 NOTE — Therapy (Addendum)
[place], [place], [address] Phone: [phone]   Fax:  [phone]  Physical Therapy Treatment Discharge Note  Patient Details  Name: Benjamin Solomon MRN: [id number] Date of Birth: 1966-01-06 Referring Provider (PT): Sherley Bounds, MD  PHYSICAL THERAPY DISCHARGE SUMMARY  Visits from Start of Care: 2  Current functional level related to goals / functional outcomes: Unable to assess due to unplanned discharge   Remaining deficits: Unable to assess due to unplanned discharge   Education / Equipment: Unable to assess due to unplanned discharge Plan: Patient agrees to discharge.  Patient goals were not met. Patient is being discharged due to not returning since the last visit.  ?????    2:46 PM, 05/11/20 Jerene Pitch, DPT Physical Therapy with [facility]  [phone] office    Encounter Date: 04/24/2020   PT End of Session - 04/24/20 1404    Visit Number 2    Number of Visits 12    Date for PT Re-Evaluation 06/02/20    Authorization Type aetna medicare HMO- visits med [employer] [employer], no auth    Progress Note Due on Visit 10    PT Start Time 1404    PT Stop Time 1442    PT Time Calculation (min) 38 min    Activity Tolerance Patient tolerated treatment well    Behavior During Therapy [facility] for tasks assessed/performed           Past Medical History:  Diagnosis Date  . Anxiety   . Arthritis   . Benign prostatic hyperplasia with lower urinary tract symptoms   . Carpal tunnel syndrome, right upper limb   . Chronic back pain   . Depression   . Essential (primary) hypertension   . Hemorrhoids   . Hyperplastic colon polyp   . Hypertension   . Low back pain   . Major depressive disorder, single episode, unspecified   . Opioid dependence ([place])   . Tubular adenoma   . Unspecified osteoarthritis, unspecified site     Past Surgical History:  Procedure Laterality Date  . CARPAL TUNNEL  RELEASE Right 02/17/2018   Procedure: CARPAL TUNNEL RELEASE;  Surgeon: Daryll Brod, MD;  Location: [facility];  Service: Orthopedics;  Laterality: Right;  . CERVICAL SPINE SURGERY  2009  . COLONOSCOPY    . EVALUATION UNDER ANESTHESIA WITH HEMORRHOIDECTOMY  12/20/2011  . FOOT SURGERY Right 2012   bone removed from the 5 th toe    There were no vitals filed for this visit.   Subjective Assessment - 04/24/20 1403    Subjective Current pain is 6/10 in the neck and currently sharp shooting. States his lower back has been bothering him states he did some exercises but not much as his back was hurting.    Pertinent History C4-C6 ACDF 2009, another fusion below initial fusion  2018, lumbar fusion L5/S1 2018    Diagnostic tests MRI and xray    Patient Stated Goals would like to get back down to baseline pain of 3/10 and no radicular symptoms    Currently in Pain? Yes    Pain Score 6     Pain Location Neck    Pain Descriptors / Indicators Tightness;Sharp    Pain Type Chronic pain    Pain Onset More than a month ago              [facility] PT Assessment - 04/24/20 0001      Assessment  Medical Diagnosis cervical radiculopathy    Referring Provider (PT) Sherley Bounds, MD                         [facility] Adult PT Treatment/Exercise - 04/24/20 0001      Neck Exercises: Standing   Other Standing Exercises towel roll down thoracic spine at wall with lumbar pillow support scapular retraction 4x5 5" holds      Neck Exercises: Seated   Other Seated Exercise diapragmatic breathing - 6 minutes - verbal and tactile cues    Other Seated Exercise thoracic extension in chair - compensates with lumbar extension even with pillow support - stopped as patient tries to move with lumbar spine      Neck Exercises: Supine   Other Supine Exercise gentle traction on ball - pressing heels onto ball with 90/90 position x20 5" hlds, hamstring isometric into ball 2x5 5" holds bilateral       Neck Exercises: Sidelying   Other Sidelying Exercise book stretch (focus on thoracic mobility and only to gentle pull) 3x5 5" holds only to left side as right painful                  PT Education - 04/24/20 1431    Education Details on anatomy, on relearning movement patterns, on what exercises should and should not feel like. on possible benefits of aquatics, on diaphragmatic vs chest breathing.    Person(s) Educated Patient    Methods Explanation    Comprehension Verbalized understanding            PT Short Term Goals - 04/21/20 1401      PT SHORT TERM GOAL #1   Title Patient will be independent in self management strategies to improve quality of life and functional outcomes.    Time 3    Period Weeks    Status New    Target Date 05/12/20      PT SHORT TERM GOAL #2   Title Patient will be able to reache behind back without severe pain in neck    Time 3    Period Weeks    Status New    Target Date 05/12/20      PT SHORT TERM GOAL #3   Title Patient will report at least 25% improvement in overall symptoms and/or function to demonstrate improved functional mobility    Time 3    Period Weeks    Status New    Target Date 05/12/20             PT Long Term Goals - 04/21/20 1402      PT LONG TERM GOAL #1   Title Patient will be able to stand and wash dishes without needing to stop secondary to severe pain to demonstrate improved function at home.    Time 6    Period Weeks    Status New    Target Date 06/02/20      PT LONG TERM GOAL #2   Title Patient will improve on FOTO score to meet predicted outcomes to demonstrate improved functional mobility.    Time 6    Period Weeks    Status New    Target Date 06/02/20      PT LONG TERM GOAL #3   Title Patient will report at least 50% improvement in overall symptoms and/or function to demonstrate improved functional mobility    Time 6    Period Weeks    Status  New    Target Date 06/02/20                  Plan - 04/24/20 1447    Clinical Impression Statement Session focused on education and initial thoracic mobility and gross mobility exercises. This was not tolerated well. Transitioned to diaphragmatic breathing which was also challenging and painful secondary to patient primarily breathing as accessory  muscle breathing. Educated patient on current presentation and discussed possible benefits of aquatic therapy secondary to current presentation.    Personal Factors and Comorbidities Comorbidity 1;Comorbidity 2    Comorbidities C4-C6 ACDF 2009, another fusion below initial fusion 2018 , lumbar fusion 2018  depression    Examination-Activity Limitations Lift;Reach Overhead;Dressing;Stand;Sleep;Bed Mobility;Bend    Examination-Participation Restrictions Occupation;Meal Prep;Yard Work;Community Activity;Cleaning    Stability/Clinical Decision Making Stable/Uncomplicated    Rehab Potential Fair    PT Frequency 2x / week    PT Duration 6 weeks    PT Treatment/Interventions ADLs/Self Care Home Management;Aquatic Therapy;Moist Heat;Electrical Stimulation;Cryotherapy;Therapeutic activities;Therapeutic exercise;Manual techniques;Neuromuscular re-education;Dry needling;Passive range of motion;Patient/family education    PT Next Visit Plan thoracic mobilization, ROM W/I tolerance, go over aquatic information  STM to spinal musculature, Shoulder ROM, trial DN if indicated, trial percussion gun.    PT Home Exercise Plan self mobilizaiton to muscles with tennis ball at wall, diaphragmatic breathing    Consulted and Agree with Plan of Care Patient           Patient will benefit from skilled therapeutic intervention in order to improve the following deficits and impairments:  Pain,Decreased activity tolerance,Decreased range of motion,Postural dysfunction,Decreased strength  Visit Diagnosis: Cervicalgia  Muscle weakness (generalized)     Problem List Patient Active Problem List    Diagnosis Date Noted  . Essential (primary) hypertension   . Low back pain   . Major depressive disorder, single episode, unspecified   . Benign prostatic hyperplasia with lower urinary tract symptoms   . Carpal tunnel syndrome, right upper limb   . Unspecified osteoarthritis, unspecified site   . S/P lumbar spinal fusion 08/07/2016  . Generalized anxiety disorder 10/04/2012  . Alcohol dependence ([place]) 06/26/2012  . Cocaine dependence ([place]) 06/26/2012  . Depressive disorder, not elsewhere classified 06/26/2012  . Bleeding external hemorrhoids 12/17/2011    2:52 PM, 04/24/20 Jerene Pitch, DPT Physical Therapy with [facility]  [phone] office  [place] [address] [place], [place], [address] Phone: [phone]   Fax:  [phone]  Name: Benjamin Solomon MRN: [id number] Date of Birth: [birth year]

## 2020-04-26 ENCOUNTER — Telehealth (HOSPITAL_COMMUNITY): Payer: Self-pay | Admitting: Physical Therapy

## 2020-04-26 ENCOUNTER — Ambulatory Visit (HOSPITAL_COMMUNITY): Payer: Medicare HMO | Admitting: Physical Therapy

## 2020-04-26 NOTE — Telephone Encounter (Signed)
pt cancelled appt for today because he is having back trouble

## 2020-04-28 DIAGNOSIS — M5136 Other intervertebral disc degeneration, lumbar region: Secondary | ICD-10-CM | POA: Diagnosis not present

## 2020-04-28 DIAGNOSIS — Z79891 Long term (current) use of opiate analgesic: Secondary | ICD-10-CM | POA: Diagnosis not present

## 2020-04-28 DIAGNOSIS — Z79899 Other long term (current) drug therapy: Secondary | ICD-10-CM | POA: Diagnosis not present

## 2020-04-28 DIAGNOSIS — M961 Postlaminectomy syndrome, not elsewhere classified: Secondary | ICD-10-CM | POA: Diagnosis not present

## 2020-04-28 DIAGNOSIS — M5412 Radiculopathy, cervical region: Secondary | ICD-10-CM | POA: Diagnosis not present

## 2020-04-28 DIAGNOSIS — G894 Chronic pain syndrome: Secondary | ICD-10-CM | POA: Diagnosis not present

## 2020-05-02 ENCOUNTER — Ambulatory Visit (HOSPITAL_COMMUNITY): Payer: Medicare HMO | Admitting: Physical Therapy

## 2020-05-04 ENCOUNTER — Ambulatory Visit (HOSPITAL_COMMUNITY): Payer: Medicare HMO | Admitting: Physical Therapy

## 2020-05-04 ENCOUNTER — Telehealth (HOSPITAL_COMMUNITY): Payer: Self-pay | Admitting: Physical Therapy

## 2020-05-04 DIAGNOSIS — R600 Localized edema: Secondary | ICD-10-CM | POA: Diagnosis not present

## 2020-05-04 NOTE — Telephone Encounter (Signed)
He has another MD apptment and will not be here today

## 2020-05-09 ENCOUNTER — Telehealth (HOSPITAL_COMMUNITY): Payer: Self-pay | Admitting: Physical Therapy

## 2020-05-09 ENCOUNTER — Ambulatory Visit (HOSPITAL_COMMUNITY): Payer: Medicare HMO | Admitting: Physical Therapy

## 2020-05-09 NOTE — Telephone Encounter (Signed)
No Show #2. Patient with previous no show on 4/19 and same day cancellations on 4/13 and 4/21. Called patient but mailbox full and unable to leave voicemail about missed appointment or cancellation policy. Will move to discharge patient next session if he does not come into next appointment.   3:15 PM, 05/09/20 Jerene Pitch, DPT Physical Therapy with [facility]  [phone] office

## 2020-05-11 ENCOUNTER — Other Ambulatory Visit: Payer: Self-pay

## 2020-05-11 ENCOUNTER — Encounter: Payer: Self-pay | Admitting: Emergency Medicine

## 2020-05-11 ENCOUNTER — Ambulatory Visit (HOSPITAL_COMMUNITY): Payer: Medicare HMO | Admitting: Physical Therapy

## 2020-05-11 ENCOUNTER — Encounter (HOSPITAL_COMMUNITY): Payer: Self-pay | Admitting: Physical Therapy

## 2020-05-11 ENCOUNTER — Ambulatory Visit
Admission: EM | Admit: 2020-05-11 | Discharge: 2020-05-11 | Disposition: A | Discharge status: Home / Self Care | Payer: Medicare HMO | Attending: Family Medicine | Admitting: Family Medicine

## 2020-05-11 ENCOUNTER — Telehealth (HOSPITAL_COMMUNITY): Payer: Self-pay | Admitting: Physical Therapy

## 2020-05-11 DIAGNOSIS — J392 Other diseases of pharynx: Secondary | ICD-10-CM

## 2020-05-11 DIAGNOSIS — R0981 Nasal congestion: Secondary | ICD-10-CM

## 2020-05-11 MED ORDER — NYSTATIN 100000 UNIT/ML MT SUSP: 500000.0000 [IU] | OROMUCOSAL | 0 refills | Status: DC | PRN

## 2020-05-11 MED ORDER — PSEUDOEPHEDRINE HCL 30 MG PO TABS: 30.0000 mg | ORAL_TABLET | ORAL | 0 refills | Status: DC | PRN

## 2020-05-11 MED ORDER — LIDOCAINE VISCOUS HCL 2 % MT SOLN: 15.0000 mL | OROMUCOSAL | 0 refills | Status: DC | PRN

## 2020-05-11 NOTE — ED Provider Notes (Signed)
[facility]    CSN: [id number] Arrival date & time: 05/11/20  1203      History   Chief Complaint Chief Complaint  Patient presents with  . Sore Throat    HPI Benjamin Solomon is a 55 y.o. male.   HPI Patient presents with two days for sore throat. He is actively taking an antibiotic for treatment of left maxillary sinusitis by ENT and has 3 days of medication remaining. Throat is sore and raw. He is not currently taking any medication for nasal congestion. He denies any dysphagia. He is current daily smoker. He is not a diabetic. Past Medical History:  Diagnosis Date  . Anxiety   . Arthritis   . Benign prostatic hyperplasia with lower urinary tract symptoms   . Carpal tunnel syndrome, right upper limb   . Chronic back pain   . Depression   . Essential (primary) hypertension   . Hemorrhoids   . Hyperplastic colon polyp   . Hypertension   . Low back pain   . Major depressive disorder, single episode, unspecified   . Opioid dependence ([place])   . Tubular adenoma   . Unspecified osteoarthritis, unspecified site     Patient Active Problem List   Diagnosis Date Noted  . Essential (primary) hypertension   . Low back pain   . Major depressive disorder, single episode, unspecified   . Benign prostatic hyperplasia with lower urinary tract symptoms   . Carpal tunnel syndrome, right upper limb   . Unspecified osteoarthritis, unspecified site   . S/P lumbar spinal fusion 08/07/2016  . Generalized anxiety disorder 10/04/2012  . Alcohol dependence ([place]) 06/26/2012  . Cocaine dependence ([place]) 06/26/2012  . Depressive disorder, not elsewhere classified 06/26/2012  . Bleeding external hemorrhoids 12/17/2011    Past Surgical History:  Procedure Laterality Date  . CARPAL TUNNEL RELEASE Right 02/17/2018   Procedure: CARPAL TUNNEL RELEASE;  Surgeon: Daryll Brod, MD;  Location: [place];  Service: Orthopedics;  Laterality: Right;  . CERVICAL SPINE  SURGERY  2009  . COLONOSCOPY    . EVALUATION UNDER ANESTHESIA WITH HEMORRHOIDECTOMY  12/20/2011  . FOOT SURGERY Right 2012   bone removed from the 5 th toe       Home Medications    Prior to Admission medications   Medication Sig Start Date End Date Taking? Authorizing Provider  nystatin (MYCOSTATIN) 100000 UNIT/ML suspension Take 5 mLs (500,000 Units total) by mouth every 4 (four) hours as needed (add lidocaine viscous 15 ml mix gargle and spit). 05/11/20  Yes Scot Jun, FNP  pseudoephedrine (SUDAFED) 30 MG tablet Take 1 tablet (30 mg total) by mouth every 4 (four) hours as needed for congestion. 05/11/20  Yes Scot Jun, FNP  ALPRAZolam Duanne Moron) 0.5 MG tablet Take 0.5 mg by mouth at bedtime as needed for sleep.  06/02/16   [provider]  amLODipine (NORVASC) 5 MG tablet Take 5 mg by mouth daily as needed (for high blood pressure).  01/27/13   [provider]  buPROPion (WELLBUTRIN XL) 300 MG 24 hr tablet Take 300 mg by mouth daily. 05/18/16   [provider]  clindamycin (CLEOCIN T) 1 % external solution Apply 1 application topically 2 (two) times daily as needed (boils).  10/13/15   [provider]  clindamycin (CLEOCIN) 300 MG capsule Take 300 mg by mouth 2 (two) times daily as needed (boils).  10/12/15   [provider]  cyclobenzaprine (FLEXERIL) 5 MG tablet Take  1 tablet (5 mg total) by mouth 3 (three) times daily as needed for muscle spasms. 06/04/18   Evalee Jefferson, PA-C  docusate sodium (COLACE) 100 MG capsule Take 200 mg by mouth daily.    [provider]  doxycycline (VIBRAMYCIN) 100 MG capsule Take 1 capsule (100 mg total) by mouth 2 (two) times daily. 03/05/20   Triplett, Tammy, PA-C  FLUoxetine (PROZAC) 20 MG tablet Take 20 mg by mouth daily.    [provider]  lidocaine (XYLOCAINE) 2 % solution Use as directed 15 mLs in the mouth or throat every 4 (four) hours as needed for mouth pain (mix with nystatin  gargle and spit). 05/11/20   Scot Jun, FNP  lisinopril-hydrochlorothiazide (PRINZIDE,ZESTORETIC) 20-12.5 MG per tablet Take 1 tablet by mouth daily.    [provider]  methocarbamol (ROBAXIN) 500 MG tablet Take 500 mg by mouth 3 (three) times daily.    [provider]  oxyCODONE-acetaminophen (PERCOCET) 10-325 MG tablet Take 1 tablet by mouth every 4 (four) hours as needed for pain. 08/08/16   Eustace Moore, MD  OXYCONTIN 20 MG 12 hr tablet Take 20 mg by mouth every 12 (twelve) hours.  06/25/16   [provider]  tamsulosin (FLOMAX) 0.4 MG CAPS capsule Take 0.4 mg by mouth daily.    [provider]    Family History Family History  Problem Relation Age of Onset  . Hypertension Mother   . Hypertension Father   . Alcohol abuse Brother   . Drug abuse Brother   . Diabetes Neg Hx   . Heart disease Neg Hx   . Kidney disease Neg Hx   . Liver disease Neg Hx     Social History Social History   Tobacco Use  . Smoking status: Current Every Day Smoker    Packs/day: 0.25    Years: 20.00    Pack years: 5.00    Types: Cigarettes  . Smokeless tobacco: Never Used  Vaping Use  . Vaping Use: Never used  Substance Use Topics  . Alcohol use: No    Alcohol/week: 0.0 standard drinks  . Drug use: No     Allergies   Patient has no known allergies.   Review of Systems Review of Systems Pertinent negatives listed in HPI   Physical Exam Triage Vital Signs ED Triage Vitals  Enc Vitals Group     BP 05/11/20 1212 (!) 146/79     Pulse Rate 05/11/20 1212 (!) 105     Resp 05/11/20 1212 17     Temp 05/11/20 1212 98.9 F (37.2 C)     Temp Source 05/11/20 1212 Oral     SpO2 05/11/20 1212 96 %     Weight --      Height --      Head Circumference --      Peak Flow --      Pain Score 05/11/20 1211 7     Pain Loc --      Pain Edu? --      Excl. in [place]? --    No data found.  Updated Vital Signs BP (!) 146/79 (BP Location: Right Arm)   Pulse  (!) 105   Temp 98.9 F (37.2 C) (Oral)   Resp 17   SpO2 96%   Visual Acuity Right Eye Distance:   Left Eye Distance:   Bilateral Distance:    Right Eye Near:   Left Eye Near:    Bilateral Near:  Physical Exam   UC Treatments / Results  Labs (all labs ordered are listed, but only abnormal results are displayed) Labs Reviewed - No data to display  EKG   Radiology No results found.  Procedures Procedures (including critical care time)  Medications Ordered in UC Medications - No data to display  Initial Impression / Assessment and Plan / UC Course  I have reviewed the triage vital signs and the nursing notes.  Pertinent labs & imaging results that were available during my care of the patient were reviewed by me and considered in my medical decision making (see chart for details).    Treating throat irritation with lidocaine viscous and nystatin. For ongoing nasal congestion causing post nasal drainage, start sudafed PRN  ENT follow-up as needed  Final Clinical Impressions(s) / UC Diagnoses   Final diagnoses:  Throat irritation  Nasal congestion   Discharge Instructions   None    ED Prescriptions    Medication Sig Dispense Auth. Provider   pseudoephedrine (SUDAFED) 30 MG tablet Take 1 tablet (30 mg total) by mouth every 4 (four) hours as needed for congestion. 30 tablet Scot Jun, FNP   lidocaine (XYLOCAINE) 2 % solution  (Status: Discontinued) Use as directed 15 mLs in the mouth or throat every 3 (three) hours as needed for mouth pain (mix with nystatin gargle and spit). 100 mL Scot Jun, FNP   nystatin (MYCOSTATIN) 100000 UNIT/ML suspension Take 5 mLs (500,000 Units total) by mouth every 4 (four) hours as needed (add lidocaine viscous 15 ml mix gargle and spit). 60 mL Scot Jun, FNP   lidocaine (XYLOCAINE) 2 % solution Use as directed 15 mLs in the mouth or throat every 4 (four) hours as needed for mouth pain (mix with nystatin  gargle and spit). 100 mL Scot Jun, FNP     PDMP not reviewed this encounter.   Scot Jun, FNP 05/11/20 1234

## 2020-05-11 NOTE — Telephone Encounter (Signed)
No Show # 3. Called patient and tried to leave VM about 3rd missed appointment and about cancellation/no show policy. Cancelling all follow up appointments and discharging patient at this time.   2:20 PM, 05/11/20 Jerene Pitch, DPT Physical Therapy with [facility]  [phone] office

## 2020-05-11 NOTE — ED Triage Notes (Signed)
Sore throat x 2 days

## 2020-05-15 ENCOUNTER — Ambulatory Visit (HOSPITAL_COMMUNITY): Payer: Medicare HMO | Admitting: Physical Therapy

## 2020-05-16 ENCOUNTER — Ambulatory Visit (HOSPITAL_COMMUNITY): Payer: Medicare HMO | Admitting: Physical Therapy

## 2020-05-18 ENCOUNTER — Ambulatory Visit (HOSPITAL_COMMUNITY): Payer: Medicare HMO | Admitting: Physical Therapy

## 2020-05-22 ENCOUNTER — Ambulatory Visit (HOSPITAL_COMMUNITY): Payer: Medicare HMO | Admitting: Physical Therapy

## 2020-05-23 ENCOUNTER — Encounter (HOSPITAL_COMMUNITY): Payer: Medicare HMO | Admitting: Physical Therapy

## 2020-05-25 ENCOUNTER — Encounter (HOSPITAL_COMMUNITY): Payer: Medicare HMO | Admitting: Physical Therapy

## 2020-05-29 DIAGNOSIS — M47817 Spondylosis without myelopathy or radiculopathy, lumbosacral region: Secondary | ICD-10-CM | POA: Diagnosis not present

## 2020-05-29 DIAGNOSIS — M5136 Other intervertebral disc degeneration, lumbar region: Secondary | ICD-10-CM | POA: Diagnosis not present

## 2020-05-29 DIAGNOSIS — M79602 Pain in left arm: Secondary | ICD-10-CM | POA: Diagnosis not present

## 2020-05-29 DIAGNOSIS — G894 Chronic pain syndrome: Secondary | ICD-10-CM | POA: Diagnosis not present

## 2020-05-30 ENCOUNTER — Encounter (HOSPITAL_COMMUNITY): Payer: Medicare HMO | Admitting: Physical Therapy

## 2020-06-01 ENCOUNTER — Encounter (HOSPITAL_COMMUNITY): Payer: Medicare HMO | Admitting: Physical Therapy

## 2020-06-27 DIAGNOSIS — M47817 Spondylosis without myelopathy or radiculopathy, lumbosacral region: Secondary | ICD-10-CM | POA: Diagnosis not present

## 2020-06-27 DIAGNOSIS — Z79899 Other long term (current) drug therapy: Secondary | ICD-10-CM | POA: Diagnosis not present

## 2020-06-27 DIAGNOSIS — M79602 Pain in left arm: Secondary | ICD-10-CM | POA: Diagnosis not present

## 2020-06-27 DIAGNOSIS — M5412 Radiculopathy, cervical region: Secondary | ICD-10-CM | POA: Diagnosis not present

## 2020-06-27 DIAGNOSIS — M5136 Other intervertebral disc degeneration, lumbar region: Secondary | ICD-10-CM | POA: Diagnosis not present

## 2020-06-27 DIAGNOSIS — G894 Chronic pain syndrome: Secondary | ICD-10-CM | POA: Diagnosis not present

## 2020-06-27 DIAGNOSIS — Z79891 Long term (current) use of opiate analgesic: Secondary | ICD-10-CM | POA: Diagnosis not present

## 2020-06-29 ENCOUNTER — Other Ambulatory Visit: Payer: Self-pay | Admitting: Neurological Surgery

## 2020-06-29 DIAGNOSIS — M5412 Radiculopathy, cervical region: Secondary | ICD-10-CM

## 2020-07-08 ENCOUNTER — Ambulatory Visit
Admission: RE | Admit: 2020-07-08 | Discharge: 2020-07-08 | Disposition: A | Discharge status: Home / Self Care | Payer: Medicare HMO | Source: Ambulatory Visit | Attending: Neurological Surgery | Admitting: Neurological Surgery

## 2020-07-08 ENCOUNTER — Other Ambulatory Visit: Payer: Self-pay

## 2020-07-08 DIAGNOSIS — M5412 Radiculopathy, cervical region: Secondary | ICD-10-CM

## 2020-07-08 DIAGNOSIS — J439 Emphysema, unspecified: Secondary | ICD-10-CM | POA: Diagnosis not present

## 2020-07-08 DIAGNOSIS — M4802 Spinal stenosis, cervical region: Secondary | ICD-10-CM | POA: Diagnosis not present

## 2020-07-08 DIAGNOSIS — Z981 Arthrodesis status: Secondary | ICD-10-CM | POA: Diagnosis not present

## 2020-07-08 DIAGNOSIS — M5021 Other cervical disc displacement,  high cervical region: Secondary | ICD-10-CM | POA: Diagnosis not present

## 2020-07-10 ENCOUNTER — Other Ambulatory Visit: Payer: Self-pay

## 2020-07-10 ENCOUNTER — Emergency Department (HOSPITAL_COMMUNITY)
Admission: EM | Admit: 2020-07-10 | Discharge: 2020-07-10 | Disposition: A | Discharge status: Home / Self Care | Payer: Medicare HMO

## 2020-07-11 DIAGNOSIS — I10 Essential (primary) hypertension: Secondary | ICD-10-CM | POA: Diagnosis not present

## 2020-07-11 DIAGNOSIS — R69 Illness, unspecified: Secondary | ICD-10-CM | POA: Diagnosis not present

## 2020-07-11 DIAGNOSIS — M545 Low back pain, unspecified: Secondary | ICD-10-CM | POA: Diagnosis not present

## 2020-07-11 DIAGNOSIS — G629 Polyneuropathy, unspecified: Secondary | ICD-10-CM | POA: Diagnosis not present

## 2020-07-11 DIAGNOSIS — Z811 Family history of alcohol abuse and dependence: Secondary | ICD-10-CM | POA: Diagnosis not present

## 2020-07-11 DIAGNOSIS — Z8249 Family history of ischemic heart disease and other diseases of the circulatory system: Secondary | ICD-10-CM | POA: Diagnosis not present

## 2020-07-11 DIAGNOSIS — Z79891 Long term (current) use of opiate analgesic: Secondary | ICD-10-CM | POA: Diagnosis not present

## 2020-07-11 DIAGNOSIS — G8929 Other chronic pain: Secondary | ICD-10-CM | POA: Diagnosis not present

## 2020-07-20 DIAGNOSIS — S129XXS Fracture of neck, unspecified, sequela: Secondary | ICD-10-CM | POA: Diagnosis not present

## 2020-07-27 DIAGNOSIS — L03211 Cellulitis of face: Secondary | ICD-10-CM | POA: Diagnosis not present

## 2020-07-28 DIAGNOSIS — M5136 Other intervertebral disc degeneration, lumbar region: Secondary | ICD-10-CM | POA: Diagnosis not present

## 2020-07-28 DIAGNOSIS — M47817 Spondylosis without myelopathy or radiculopathy, lumbosacral region: Secondary | ICD-10-CM | POA: Diagnosis not present

## 2020-07-28 DIAGNOSIS — G894 Chronic pain syndrome: Secondary | ICD-10-CM | POA: Diagnosis not present

## 2020-07-28 DIAGNOSIS — M79602 Pain in left arm: Secondary | ICD-10-CM | POA: Diagnosis not present

## 2020-08-01 DIAGNOSIS — J340 Abscess, furuncle and carbuncle of nose: Secondary | ICD-10-CM | POA: Diagnosis not present

## 2020-08-01 DIAGNOSIS — L03211 Cellulitis of face: Secondary | ICD-10-CM | POA: Diagnosis not present

## 2020-08-03 DIAGNOSIS — L03211 Cellulitis of face: Secondary | ICD-10-CM | POA: Diagnosis not present

## 2020-08-03 DIAGNOSIS — J340 Abscess, furuncle and carbuncle of nose: Secondary | ICD-10-CM | POA: Diagnosis not present

## 2020-08-04 ENCOUNTER — Other Ambulatory Visit: Payer: Self-pay

## 2020-08-04 ENCOUNTER — Inpatient Hospital Stay (HOSPITAL_COMMUNITY): Payer: Medicare HMO | Admitting: Anesthesiology

## 2020-08-04 ENCOUNTER — Inpatient Hospital Stay (HOSPITAL_COMMUNITY)
Admission: EM | Admit: 2020-08-04 | Discharge: 2020-08-07 | DRG: 580 | Disposition: A | Discharge status: Home / Self Care | Payer: Medicare HMO | Attending: Internal Medicine | Admitting: Internal Medicine

## 2020-08-04 ENCOUNTER — Inpatient Hospital Stay (HOSPITAL_COMMUNITY): Payer: Medicare HMO

## 2020-08-04 ENCOUNTER — Encounter (HOSPITAL_COMMUNITY)
Admission: EM | Disposition: A | Discharge status: Home / Self Care | Payer: Self-pay | Source: Home / Self Care | Attending: Internal Medicine

## 2020-08-04 ENCOUNTER — Encounter (HOSPITAL_COMMUNITY): Payer: Self-pay

## 2020-08-04 ENCOUNTER — Inpatient Hospital Stay: Payer: Self-pay

## 2020-08-04 DIAGNOSIS — I10 Essential (primary) hypertension: Secondary | ICD-10-CM | POA: Diagnosis not present

## 2020-08-04 DIAGNOSIS — Z8249 Family history of ischemic heart disease and other diseases of the circulatory system: Secondary | ICD-10-CM | POA: Diagnosis not present

## 2020-08-04 DIAGNOSIS — R69 Illness, unspecified: Secondary | ICD-10-CM | POA: Diagnosis not present

## 2020-08-04 DIAGNOSIS — G5601 Carpal tunnel syndrome, right upper limb: Secondary | ICD-10-CM | POA: Diagnosis present

## 2020-08-04 DIAGNOSIS — Z20822 Contact with and (suspected) exposure to covid-19: Secondary | ICD-10-CM | POA: Diagnosis present

## 2020-08-04 DIAGNOSIS — F329 Major depressive disorder, single episode, unspecified: Secondary | ICD-10-CM | POA: Diagnosis present

## 2020-08-04 DIAGNOSIS — E7439 Other disorders of intestinal carbohydrate absorption: Secondary | ICD-10-CM | POA: Diagnosis not present

## 2020-08-04 DIAGNOSIS — A419 Sepsis, unspecified organism: Secondary | ICD-10-CM | POA: Diagnosis not present

## 2020-08-04 DIAGNOSIS — L03211 Cellulitis of face: Secondary | ICD-10-CM | POA: Diagnosis not present

## 2020-08-04 DIAGNOSIS — L0201 Cutaneous abscess of face: Secondary | ICD-10-CM | POA: Diagnosis not present

## 2020-08-04 DIAGNOSIS — K0381 Cracked tooth: Secondary | ICD-10-CM | POA: Diagnosis present

## 2020-08-04 DIAGNOSIS — M549 Dorsalgia, unspecified: Secondary | ICD-10-CM | POA: Diagnosis present

## 2020-08-04 DIAGNOSIS — K122 Cellulitis and abscess of mouth: Secondary | ICD-10-CM | POA: Diagnosis not present

## 2020-08-04 DIAGNOSIS — Z86010 Personal history of colonic polyps: Secondary | ICD-10-CM

## 2020-08-04 DIAGNOSIS — Z872 Personal history of diseases of the skin and subcutaneous tissue: Secondary | ICD-10-CM | POA: Diagnosis not present

## 2020-08-04 DIAGNOSIS — F419 Anxiety disorder, unspecified: Secondary | ICD-10-CM | POA: Diagnosis present

## 2020-08-04 DIAGNOSIS — N401 Enlarged prostate with lower urinary tract symptoms: Secondary | ICD-10-CM | POA: Diagnosis not present

## 2020-08-04 DIAGNOSIS — Z79899 Other long term (current) drug therapy: Secondary | ICD-10-CM | POA: Diagnosis not present

## 2020-08-04 DIAGNOSIS — D649 Anemia, unspecified: Secondary | ICD-10-CM | POA: Diagnosis not present

## 2020-08-04 DIAGNOSIS — G8929 Other chronic pain: Secondary | ICD-10-CM | POA: Diagnosis present

## 2020-08-04 DIAGNOSIS — L0291 Cutaneous abscess, unspecified: Secondary | ICD-10-CM | POA: Diagnosis not present

## 2020-08-04 DIAGNOSIS — F17210 Nicotine dependence, cigarettes, uncomplicated: Secondary | ICD-10-CM | POA: Diagnosis present

## 2020-08-04 DIAGNOSIS — R220 Localized swelling, mass and lump, head: Secondary | ICD-10-CM | POA: Diagnosis not present

## 2020-08-04 HISTORY — PX: INCISION AND DRAINAGE ABSCESS: SHX5864

## 2020-08-04 HISTORY — PX: TOOTH EXTRACTION: SHX859

## 2020-08-04 HISTORY — PX: NASAL ENDOSCOPY: SHX6577

## 2020-08-04 LAB — BASIC METABOLIC PANEL
Anion gap: 8 (ref 5–15)
BUN: 15 mg/dL (ref 6–20)
CO2: 24 mmol/L (ref 22–32)
Calcium: 8.5 mg/dL — ABNORMAL LOW (ref 8.9–10.3)
Chloride: 104 mmol/L (ref 98–111)
Creatinine, Ser: 1.1 mg/dL (ref 0.61–1.24)
GFR, Estimated: >60 mL/min (ref >60)
Glucose, Bld: 108 mg/dL — ABNORMAL HIGH (ref 70–99)
Potassium: 3.2 mmol/L — ABNORMAL LOW (ref 3.5–5.1)
Sodium: 136 mmol/L (ref 135–145)

## 2020-08-04 LAB — CBC WITH DIFFERENTIAL/PLATELET
Abs Immature Granulocytes: 0.06 10*3/uL (ref 0.00–0.07)
Basophils Absolute: 0 10*3/uL (ref 0.0–0.1)
Basophils Relative: 0 %
Eosinophils Absolute: 0.1 10*3/uL (ref 0.0–0.5)
Eosinophils Relative: 0 %
HCT: 36.5 % — ABNORMAL LOW (ref 39.0–52.0)
Hemoglobin: 12.3 g/dL — ABNORMAL LOW (ref 13.0–17.0)
Immature Granulocytes: 0 %
Lymphocytes Relative: 17 %
Lymphs Abs: 3.2 10*3/uL (ref 0.7–4.0)
MCH: 30 pg (ref 26.0–34.0)
MCHC: 33.7 g/dL (ref 30.0–36.0)
MCV: 89 fL (ref 80.0–100.0)
Monocytes Absolute: 1.8 10*3/uL — ABNORMAL HIGH (ref 0.1–1.0)
Monocytes Relative: 10 %
Neutro Abs: 13.9 10*3/uL — ABNORMAL HIGH (ref 1.7–7.7)
Neutrophils Relative %: 73 %
Platelets: 300 10*3/uL (ref 150–400)
RBC: 4.1 MIL/uL — ABNORMAL LOW (ref 4.22–5.81)
RDW: 14.5 % (ref 11.5–15.5)
WBC: 19.1 10*3/uL — ABNORMAL HIGH (ref 4.0–10.5)
nRBC: 0 % (ref 0.0–0.2)

## 2020-08-04 LAB — RESP PANEL BY RT-PCR (FLU A&B, COVID) ARPGX2
Influenza A by PCR: NEGATIVE
Influenza B by PCR: NEGATIVE
SARS Coronavirus 2 by RT PCR: NEGATIVE

## 2020-08-04 LAB — HIV ANTIBODY (ROUTINE TESTING W REFLEX): HIV Screen 4th Generation wRfx: NONREACTIVE

## 2020-08-04 LAB — CK: Total CK: 67 U/L (ref 49–397)

## 2020-08-04 SURGERY — INCISION AND DRAINAGE, ABSCESS
Anesthesia: General

## 2020-08-04 MED ORDER — SUGAMMADEX SODIUM 200 MG/2ML IV SOLN
INTRAVENOUS | Status: DC | PRN
  Administered 2020-08-04: 200 mg via INTRAVENOUS

## 2020-08-04 MED ORDER — ORAL CARE MOUTH RINSE: 15.0000 mL | Freq: Once | OROMUCOSAL | Status: AC

## 2020-08-04 MED ORDER — 0.9 % SODIUM CHLORIDE (POUR BTL) OPTIME
TOPICAL | Status: DC | PRN
  Administered 2020-08-04: 1000 mL

## 2020-08-04 MED ORDER — ARIPIPRAZOLE 5 MG PO TABS
5.0000 mg | ORAL_TABLET | Freq: Every day | ORAL | Status: DC
  Administered 2020-08-04 – 2020-08-06 (×3): 5 mg via ORAL
  Filled 2020-08-04 (×4): qty 1

## 2020-08-04 MED ORDER — BACITRACIN ZINC 500 UNIT/GM EX OINT
TOPICAL_OINTMENT | CUTANEOUS | Status: AC
  Filled 2020-08-04: qty 28.35

## 2020-08-04 MED ORDER — OXYMETAZOLINE HCL 0.05 % NA SOLN
NASAL | Status: AC
  Filled 2020-08-04: qty 30

## 2020-08-04 MED ORDER — BSS IO SOLN
INTRAOCULAR | Status: DC | PRN
  Administered 2020-08-04: 1 via INTRAOCULAR

## 2020-08-04 MED ORDER — LIDOCAINE-EPINEPHRINE 1 %-1:100000 IJ SOLN
INTRAMUSCULAR | Status: DC | PRN
  Administered 2020-08-04: 5 mL

## 2020-08-04 MED ORDER — FENTANYL CITRATE (PF) 100 MCG/2ML IJ SOLN
25.0000 ug | INTRAMUSCULAR | Status: DC | PRN
  Administered 2020-08-04: 50 ug via INTRAVENOUS

## 2020-08-04 MED ORDER — LIDOCAINE-EPINEPHRINE 1 %-1:100000 IJ SOLN
INTRAMUSCULAR | Status: AC
  Filled 2020-08-04: qty 1

## 2020-08-04 MED ORDER — MORPHINE SULFATE (PF) 4 MG/ML IV SOLN
4.0000 mg | Freq: Once | INTRAVENOUS | Status: AC
Start: 2020-08-04 — End: 2020-08-04
  Administered 2020-08-04: 4 mg via INTRAVENOUS
  Filled 2020-08-04: qty 1

## 2020-08-04 MED ORDER — FENTANYL CITRATE (PF) 250 MCG/5ML IJ SOLN
INTRAMUSCULAR | Status: AC
  Filled 2020-08-04: qty 5

## 2020-08-04 MED ORDER — AMISULPRIDE (ANTIEMETIC) 5 MG/2ML IV SOLN: 10.0000 mg | Freq: Once | INTRAVENOUS | Status: DC | PRN

## 2020-08-04 MED ORDER — HYDROCHLOROTHIAZIDE 12.5 MG PO CAPS
12.5000 mg | ORAL_CAPSULE | Freq: Once | ORAL | Status: AC
  Administered 2020-08-04: 12.5 mg via ORAL
  Filled 2020-08-04: qty 1

## 2020-08-04 MED ORDER — BSS IO SOLN
INTRAOCULAR | Status: AC
  Filled 2020-08-04: qty 15

## 2020-08-04 MED ORDER — BUPROPION HCL ER (XL) 150 MG PO TB24
300.0000 mg | ORAL_TABLET | Freq: Every day | ORAL | Status: DC
  Administered 2020-08-05 – 2020-08-07 (×3): 300 mg via ORAL
  Filled 2020-08-04: qty 1
  Filled 2020-08-04 (×3): qty 2

## 2020-08-04 MED ORDER — LACTATED RINGERS IV SOLN: INTRAVENOUS | Status: DC

## 2020-08-04 MED ORDER — ONDANSETRON HCL 4 MG/2ML IJ SOLN
INTRAMUSCULAR | Status: DC | PRN
  Administered 2020-08-04: 4 mg via INTRAVENOUS

## 2020-08-04 MED ORDER — FENTANYL CITRATE (PF) 250 MCG/5ML IJ SOLN
INTRAMUSCULAR | Status: DC | PRN
  Administered 2020-08-04: 50 ug via INTRAVENOUS
  Administered 2020-08-04: 150 ug via INTRAVENOUS

## 2020-08-04 MED ORDER — MUPIROCIN 2 % EX OINT
TOPICAL_OINTMENT | CUTANEOUS | Status: AC
  Filled 2020-08-04: qty 22

## 2020-08-04 MED ORDER — DEXAMETHASONE SODIUM PHOSPHATE 10 MG/ML IJ SOLN
8.0000 mg | Freq: Three times a day (TID) | INTRAMUSCULAR | Status: AC
  Administered 2020-08-04 – 2020-08-05 (×6): 8 mg via INTRAVENOUS
  Filled 2020-08-04 (×3): qty 0.8
  Filled 2020-08-04: qty 1
  Filled 2020-08-04: qty 0.8

## 2020-08-04 MED ORDER — ENOXAPARIN SODIUM 40 MG/0.4ML IJ SOSY
40.0000 mg | PREFILLED_SYRINGE | INTRAMUSCULAR | Status: DC
  Administered 2020-08-04 – 2020-08-07 (×3): 40 mg via SUBCUTANEOUS
  Filled 2020-08-04 (×4): qty 0.4

## 2020-08-04 MED ORDER — OXYCODONE-ACETAMINOPHEN 5-325 MG PO TABS
2.0000 | ORAL_TABLET | ORAL | Status: DC | PRN
  Administered 2020-08-04 – 2020-08-07 (×12): 2 via ORAL
  Filled 2020-08-04 (×12): qty 2

## 2020-08-04 MED ORDER — MIDAZOLAM HCL 2 MG/2ML IJ SOLN
INTRAMUSCULAR | Status: AC
  Filled 2020-08-04: qty 2

## 2020-08-04 MED ORDER — PROPOFOL 10 MG/ML IV BOLUS
INTRAVENOUS | Status: DC | PRN
Start: 2020-08-04 — End: 2020-08-04
  Administered 2020-08-04: 100 mg via INTRAVENOUS

## 2020-08-04 MED ORDER — OXYMETAZOLINE HCL 0.05 % NA SOLN
NASAL | Status: DC | PRN
  Administered 2020-08-04: 1

## 2020-08-04 MED ORDER — PHENYLEPHRINE HCL (PRESSORS) 10 MG/ML IV SOLN
INTRAVENOUS | Status: DC | PRN
  Administered 2020-08-04: 80 ug via INTRAVENOUS

## 2020-08-04 MED ORDER — SODIUM CHLORIDE 0.9 % IV SOLN
3.0000 g | Freq: Four times a day (QID) | INTRAVENOUS | Status: DC
  Administered 2020-08-04 – 2020-08-07 (×12): 3 g via INTRAVENOUS
  Filled 2020-08-04 (×3): qty 8
  Filled 2020-08-04 (×2): qty 3
  Filled 2020-08-04 (×2): qty 8
  Filled 2020-08-04: qty 3
  Filled 2020-08-04: qty 8
  Filled 2020-08-04: qty 3
  Filled 2020-08-04 (×5): qty 8
  Filled 2020-08-04: qty 3

## 2020-08-04 MED ORDER — VANCOMYCIN HCL 1500 MG/300ML IV SOLN
1500.0000 mg | Freq: Once | INTRAVENOUS | Status: DC
  Administered 2020-08-04: 1500 mg via INTRAVENOUS
  Filled 2020-08-04: qty 300

## 2020-08-04 MED ORDER — MIDAZOLAM HCL 2 MG/2ML IJ SOLN
INTRAMUSCULAR | Status: DC | PRN
  Administered 2020-08-04: 2 mg via INTRAVENOUS

## 2020-08-04 MED ORDER — MORPHINE SULFATE ER 15 MG PO TBCR
15.0000 mg | EXTENDED_RELEASE_TABLET | Freq: Two times a day (BID) | ORAL | Status: DC
Start: 2020-08-04 — End: 2020-08-07
  Administered 2020-08-04 – 2020-08-07 (×7): 15 mg via ORAL
  Filled 2020-08-04 (×7): qty 1

## 2020-08-04 MED ORDER — POTASSIUM CHLORIDE 10 MEQ/100ML IV SOLN
10.0000 meq | Freq: Once | INTRAVENOUS | Status: AC
  Administered 2020-08-04: 10 meq via INTRAVENOUS
  Filled 2020-08-04: qty 100

## 2020-08-04 MED ORDER — LIDOCAINE 2% (20 MG/ML) 5 ML SYRINGE
INTRAMUSCULAR | Status: DC | PRN
Start: 2020-08-04 — End: 2020-08-04
  Administered 2020-08-04: 40 mg via INTRAVENOUS

## 2020-08-04 MED ORDER — FLUOXETINE HCL 20 MG PO CAPS
20.0000 mg | ORAL_CAPSULE | Freq: Every day | ORAL | Status: DC
  Administered 2020-08-04 – 2020-08-07 (×4): 20 mg via ORAL
  Filled 2020-08-04 (×4): qty 1

## 2020-08-04 MED ORDER — HYDRALAZINE HCL 20 MG/ML IJ SOLN
5.0000 mg | Freq: Once | INTRAMUSCULAR | Status: AC
  Administered 2020-08-04: 5 mg via INTRAVENOUS

## 2020-08-04 MED ORDER — SODIUM CHLORIDE 0.9 % IV SOLN
6.0000 mg/kg | Freq: Every day | INTRAVENOUS | Status: DC
  Filled 2020-08-04 (×2): qty 8

## 2020-08-04 MED ORDER — NICOTINE 21 MG/24HR TD PT24
21.0000 mg | MEDICATED_PATCH | Freq: Once | TRANSDERMAL | Status: AC
  Administered 2020-08-04: 21 mg via TRANSDERMAL
  Filled 2020-08-04: qty 1

## 2020-08-04 MED ORDER — ROCURONIUM BROMIDE 10 MG/ML (PF) SYRINGE
PREFILLED_SYRINGE | INTRAVENOUS | Status: DC | PRN
Start: 2020-08-04 — End: 2020-08-04
  Administered 2020-08-04: 40 mg via INTRAVENOUS

## 2020-08-04 MED ORDER — FENTANYL CITRATE (PF) 100 MCG/2ML IJ SOLN
INTRAMUSCULAR | Status: AC
  Filled 2020-08-04: qty 2

## 2020-08-04 MED ORDER — HYDRALAZINE HCL 20 MG/ML IJ SOLN
INTRAMUSCULAR | Status: AC
  Filled 2020-08-04: qty 1

## 2020-08-04 MED ORDER — SILVER NITRATE-POT NITRATE 75-25 % EX MISC
CUTANEOUS | Status: AC
  Filled 2020-08-04: qty 10

## 2020-08-04 MED ORDER — LISINOPRIL 20 MG PO TABS
20.0000 mg | ORAL_TABLET | Freq: Once | ORAL | Status: AC
  Administered 2020-08-04: 20 mg via ORAL
  Filled 2020-08-04: qty 1

## 2020-08-04 MED ORDER — PIPERACILLIN-TAZOBACTAM 3.375 G IVPB 30 MIN: 3.3750 g | Freq: Once | INTRAVENOUS | Status: DC

## 2020-08-04 MED ORDER — CHLORHEXIDINE GLUCONATE 0.12 % MT SOLN: 15.0000 mL | Freq: Once | OROMUCOSAL | Status: AC

## 2020-08-04 MED ORDER — ONDANSETRON HCL 4 MG/2ML IJ SOLN
4.0000 mg | Freq: Once | INTRAMUSCULAR | Status: AC
  Administered 2020-08-04: 4 mg via INTRAVENOUS
  Filled 2020-08-04: qty 2

## 2020-08-04 MED ORDER — CHLORHEXIDINE GLUCONATE 0.12 % MT SOLN
OROMUCOSAL | Status: AC
  Administered 2020-08-04: 15 mL via OROMUCOSAL
  Filled 2020-08-04: qty 15

## 2020-08-04 SURGICAL SUPPLY — 77 items
BAG COUNTER SPONGE SURGICOUNT (BAG) ×2 IMPLANT
BAG SPNG CNTER NS LX DISP (BAG) ×1
BAND INSRT 18 STRL LF DISP RB (MISCELLANEOUS)
BAND RUBBER #18 3X1/16 STRL (MISCELLANEOUS) IMPLANT
BLADE SURG 15 STRL LF DISP TIS (BLADE) ×1 IMPLANT
BLADE SURG 15 STRL SS (BLADE) ×2
BNDG CONFORM 2 STRL LF (GAUZE/BANDAGES/DRESSINGS) ×2 IMPLANT
BUR CROSS CUT FISSURE 1.6 (BURR) ×2 IMPLANT
CANISTER SUCT 3000ML PPV (MISCELLANEOUS) ×2 IMPLANT
CATH ROBINSON RED A/P 12FR (CATHETERS) IMPLANT
COAGULATOR SUCT SWTCH 10FR 6 (ELECTROSURGICAL) ×2 IMPLANT
COVER MAYO STAND STRL (DRAPES) IMPLANT
COVER SURGICAL LIGHT HANDLE (MISCELLANEOUS) ×6 IMPLANT
DECANTER SPIKE VIAL GLASS SM (MISCELLANEOUS) ×2 IMPLANT
DRAIN PENROSE 1/4X12 LTX STRL (WOUND CARE) ×2 IMPLANT
DRAPE HALF SHEET 40X57 (DRAPES) IMPLANT
DRAPE ORTHO SPLIT 77X108 STRL (DRAPES) ×2
DRAPE SURG ORHT 6 SPLT 77X108 (DRAPES) ×1 IMPLANT
DRAPE U-SHAPE 76X120 STRL (DRAPES) ×2 IMPLANT
DRESSING NASAL POPE 10X1.5X2.5 (GAUZE/BANDAGES/DRESSINGS) ×1 IMPLANT
DRSG NASAL POPE 10X1.5X2.5 (GAUZE/BANDAGES/DRESSINGS) ×2
DRSG NASOPORE 8CM (GAUZE/BANDAGES/DRESSINGS) IMPLANT
DRSG PAD ABDOMINAL 8X10 ST (GAUZE/BANDAGES/DRESSINGS) IMPLANT
ELECT COATED BLADE 2.86 ST (ELECTRODE) ×2 IMPLANT
ELECT NDL BLADE 2-5/6 (NEEDLE) IMPLANT
ELECT NEEDLE BLADE 2-5/6 (NEEDLE) ×2 IMPLANT
ELECT REM PT RETURN 9FT ADLT (ELECTROSURGICAL) ×2
ELECTRODE REM PT RTRN 9FT ADLT (ELECTROSURGICAL) ×1 IMPLANT
GAUZE 4X4 16PLY ~~LOC~~+RFID DBL (SPONGE) ×2 IMPLANT
GAUZE PACKING FOLDED 2  STR (GAUZE/BANDAGES/DRESSINGS) ×2
GAUZE PACKING FOLDED 2 STR (GAUZE/BANDAGES/DRESSINGS) ×1 IMPLANT
GAUZE SPONGE 2X2 8PLY STRL LF (GAUZE/BANDAGES/DRESSINGS) IMPLANT
GAUZE SPONGE 4X4 12PLY STRL (GAUZE/BANDAGES/DRESSINGS) IMPLANT
GAUZE SPONGE 4X4 12PLY STRL LF (GAUZE/BANDAGES/DRESSINGS) ×2 IMPLANT
GLOVE SRG 8 PF TXTR STRL LF DI (GLOVE) IMPLANT
GLOVE SURG ENC MOIS LTX SZ6.5 (GLOVE) ×2 IMPLANT
GLOVE SURG ENC MOIS LTX SZ8 (GLOVE) ×2 IMPLANT
GLOVE SURG LTX SZ7.5 (GLOVE) ×2 IMPLANT
GLOVE SURG UNDER POLY LF SZ8 (GLOVE)
GOWN STRL REUS W/ TWL LRG LVL3 (GOWN DISPOSABLE) ×2 IMPLANT
GOWN STRL REUS W/ TWL XL LVL3 (GOWN DISPOSABLE) ×1 IMPLANT
GOWN STRL REUS W/TWL LRG LVL3 (GOWN DISPOSABLE) ×4
GOWN STRL REUS W/TWL XL LVL3 (GOWN DISPOSABLE) ×2
HEMOSTAT SURGICEL 2X4 FIBR (HEMOSTASIS) ×2 IMPLANT
IV NS 1000ML (IV SOLUTION) ×2
IV NS 1000ML BAXH (IV SOLUTION) ×1 IMPLANT
KIT BASIN OR (CUSTOM PROCEDURE TRAY) ×4 IMPLANT
KIT TURNOVER KIT B (KITS) ×4 IMPLANT
MARKER SKIN DUAL TIP RULER LAB (MISCELLANEOUS) ×2 IMPLANT
NDL HYPO 25GX1X1/2 BEV (NEEDLE) ×3 IMPLANT
NEEDLE HYPO 25GX1X1/2 BEV (NEEDLE) ×6 IMPLANT
NS IRRIG 1000ML POUR BTL (IV SOLUTION) ×4 IMPLANT
PACK SURGICAL SETUP 50X90 (CUSTOM PROCEDURE TRAY) ×2 IMPLANT
PAD ARMBOARD 7.5X6 YLW CONV (MISCELLANEOUS) ×6 IMPLANT
PATTIES SURGICAL .5 X3 (DISPOSABLE) ×1 IMPLANT
PENCIL SMOKE EVACUATOR (MISCELLANEOUS) ×2 IMPLANT
POSITIONER HEAD DONUT 9IN (MISCELLANEOUS) IMPLANT
SLEEVE IRRIGATION ELITE 7 (MISCELLANEOUS) ×2 IMPLANT
SOL ANTI FOG 6CC (MISCELLANEOUS) IMPLANT
SOLUTION ANTI FOG 6CC (MISCELLANEOUS) ×1
SOLUTION BUTLER CLEAR DIP (MISCELLANEOUS) IMPLANT
SPONGE GAUZE 2X2 STER 10/PKG (GAUZE/BANDAGES/DRESSINGS)
SPONGE SURGIFOAM ABS GEL 12-7 (HEMOSTASIS) IMPLANT
SUT CHROMIC 3 0 PS 2 (SUTURE) ×2 IMPLANT
SUT CHROMIC 4 0 P 3 18 (SUTURE) ×1 IMPLANT
SUT ETHILON 2 0 FS 18 (SUTURE) ×2 IMPLANT
SUT SILK 2 0 SH CR/8 (SUTURE) IMPLANT
SWAB COLLECTION DEVICE MRSA (MISCELLANEOUS) ×2 IMPLANT
SWAB CULTURE ESWAB REG 1ML (MISCELLANEOUS) ×2 IMPLANT
SYR BULB IRRIG 60ML STRL (SYRINGE) ×4 IMPLANT
SYR CONTROL 10ML LL (SYRINGE) ×2 IMPLANT
TOWEL GREEN STERILE (TOWEL DISPOSABLE) IMPLANT
TRAY ENT MC OR (CUSTOM PROCEDURE TRAY) ×4 IMPLANT
TUBE CONNECTING 12X1/4 (SUCTIONS) ×2 IMPLANT
TUBE SALEM SUMP 16 FR W/ARV (TUBING) ×2 IMPLANT
TUBING IRRIGATION (MISCELLANEOUS) ×2 IMPLANT
YANKAUER SUCT BULB TIP NO VENT (SUCTIONS) ×4 IMPLANT

## 2020-08-04 NOTE — ED Triage Notes (Signed)
Pt c/o facial swelling x 1 week. Pt states he was given antibiotics and steroids but they are not working.

## 2020-08-04 NOTE — H&P (Signed)
Date: 08/04/2020               Patient Name:  Benjamin Solomon MRN: [id number]  DOB: March 31, 1965 Age / Sex: 55 y.o., male   PCP: Medicine, [place] Service: Internal Medicine Teaching Service         Attending Physician: Dr. Sid Falcon, MD    First Contact: Dr. Lorin Glass Pager: [phone]  Second Contact: Dr. Laural Golden Pager: [phone]       After Hours (After 5p/  First Contact Pager: [phone]  weekends / holidays): Second Contact Pager: [phone]   Chief Complaint: Facial Swelling  History of Present Illness: [name] is a 55 year old male with a past medical history of HTN, chronic low back pain, depression, and BPH who presents to the [name] with facial swelling.  Patient states that his has had recurrent facial swelling started earlier this year.  He states that his facial swelling is gone down with steroids and antibiotics in the past, but yesterday he noticed an acute change in his swelling.  He states that his his usual facial swelling is on the left side but yesterday he noticed to go across his nose and onto the right side.  He has now had difficulty opening his eyes secondary to eye swelling as well.  He states that he is on an ACE inhibitor, but has been on the same dose for the past 10 years.  He endorses mild fevers, but denies headaches, difficulty handling secretions, nausea, vomiting, chest pain, dyspnea, abdominal pain, constipation, or diarrhea.  He is seen by Dr. Warren Lacy who is a ENT at [facility]  Patient did have a CT head yesterday which showed a 2.1 x 1.6 x 2.9 cm abscess on the left side of his nose.  Meds:  No outpatient medications have been marked as taking for the 08/04/20 encounter [facility] Encounter).  Tamsulosin 0.4 mg daily  Percocet 10-325 mg every 4 hours as needed OxyContin 15 mg every 12 hours  amlodipine 5 mg daily  Allergies: Allergies as of 08/04/2020   (No Known Allergies)   Past  Medical History:  Diagnosis Date   Anxiety    Arthritis    Benign prostatic hyperplasia with lower urinary tract symptoms    Carpal tunnel syndrome, right upper limb    Chronic back pain    Depression    Essential (primary) hypertension    Hemorrhoids    Hyperplastic colon polyp    Hypertension    Low back pain    Major depressive disorder, single episode, unspecified    Opioid dependence ([facility])    Tubular adenoma    Unspecified osteoarthritis, unspecified site     Family History:  Family History  Problem Relation Age of Onset   Hypertension Mother    Hypertension Father    Alcohol abuse Brother    Drug abuse Brother    Diabetes Neg Hx    Heart disease Neg Hx    Kidney disease Neg Hx    Liver disease Neg Hx      Social History:  Social History   Socioeconomic History   Marital status: Married    Spouse name: Not on file   Number of children: Not on file   Years of education: Not on file   Highest education level: Not on file  Occupational History   Not on file  Tobacco Use   Smoking status:  Every Day    Packs/day: 0.25    Years: 20.00    Pack years: 5.00    Types: Cigarettes   Smokeless tobacco: Never  Vaping Use   Vaping Use: Never used  Substance and Sexual Activity   Alcohol use: No    Alcohol/week: 0.0 standard drinks   Drug use: No   Sexual activity: Yes  Other Topics Concern   Not on file  Social History Narrative   09/22/2012 AHW Narada was born and grew up in [place], [place]. He has 2 brothers and 4 sisters. He is the oldest. He reports his childhood was "pretty good." He was raised by his maternal grandmother. He achieved a GED. He has been married for 22 years. He has one son and one daughter. He lives with his wife. 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 His social support system consists of his wife and his father. 09/22/2012 AHW   Social Determinants of Health   Financial Resource Strain: Not on file  Food  Insecurity: Not on file  Transportation Needs: Not on file  Physical Activity: Not on file  Stress: Not on file  Social Connections: Not on file  Intimate Partner Violence: Not on file     Review of Systems: A complete ROS was negative except as per HPI.   Physical Exam: Blood pressure 129/81, pulse 92, temperature 100 F (37.8 C), resp. rate 15, height '5\' 8"'$  (1.727 m), weight 70.3 kg, SpO2 100 %. Physical Exam Constitutional:      Comments: Appears to be in mild distress, able answer questions appropriately.   HENT:     Head:     Comments: Fascial swelling present    Nose: No congestion or rhinorrhea.     Comments: No nasal congestion, polyps, necrosis, or erythema present    Mouth/Throat:     Mouth: Mucous membranes are moist.     Pharynx: No posterior oropharyngeal erythema.     Comments: Poor dentition with multiple caries. Missing left second premolar, with no drainage on palpation.  Eyes:     General:        Right eye: No discharge.        Left eye: No discharge.  Cardiovascular:     Rate and Rhythm: Normal rate and regular rhythm.     Pulses: Normal pulses.     Heart sounds: Normal heart sounds. No murmur heard.   No friction rub. No gallop.  Pulmonary:     Effort: Pulmonary effort is normal.     Breath sounds: Normal breath sounds. No wheezing, rhonchi or rales.  Abdominal:     General: Abdomen is flat. Bowel sounds are normal.     Tenderness: There is no abdominal tenderness. There is no guarding.  Musculoskeletal:        General: No signs of injury.     Right lower leg: No edema.     Left lower leg: No edema.  Skin:    General: Skin is warm.  Neurological:     Mental Status: He is alert and oriented to person, place, and time.  Psychiatric:        Mood and Affect: Mood normal.        Behavior: Behavior normal.         Assessment & Plan by Problem: Active Problems:   Facial abscess  Facial Abscess: Patient presents with acute worsening of facial  swelling from the left side to the right side. Has been treated  with doxycycline in the outpatient setting with steroids from ENT with [facility].  Do not have CT head findings in our system, but reported to have 2.1 x 1.6 x 2.9 cm abscess on the left side of the nose.  Has leukocytosis of 19.1, no fevers, hemodynamically stable. No obvious skin lacerations, likely odontogenic source given his poor dentition.  Spoke with infectious disease at bedside and will start daptomycin ampicillin-sulbactam per gram-negative coverage.  ENT was also consulted for drainage of the abscess.  Given poor dentition we will also reach out the dental for tooth extraction. - Daptomycin 6 mg/kg daily - Unasyn 3 g every 6 hours - Decadron 8 mg every 8 hours - Appreciate ENT recommendations and interventions - Appreciate infectious disease recommendations - Patient n.p.o. - We will consult with dental for tooth extraction.  Hypertension: Patient on amlodipine 5 mg daily at home, and Zestoretic 20-12.5 mg daily.  Will hold blood pressure medications until after I&D by ENT.  Chronic back pain: Patient with chronic back and neck pain, currently takes oxycodone-acetaminophen 10-325 mg every 4 hours as needed and morphine sulfate extended release 50 mg tabs twice daily.  Per PDMP patient has been on this medication for the past 2 years.  We will continue during his hospitalization. - Oxycodone acetaminophen 10-325 mg every 4 hours as needed for pain - MS Contin 15 mg every 12 hours  Depression Anxiety: Patient on home regimen of Prozac 20 mg, Wellbutrin 300 mg, Abilify 5 mg nightly, and alprazolam 0.5 mg at night as needed.   -Continue Prozac 20 mg daily - We will continue Wellbutrin 3 mg nightly - Continue Abilify 5 mg nightly  Dispo: Admit patient to Inpatient with expected length of stay greater than 2 midnights.  Signed: Maudie Mercury, MD 08/04/2020, 12:43 PM  Pager: [phone] After 5pm on weekdays and 1pm on  weekends: On Call pager: [phone]

## 2020-08-04 NOTE — ED Provider Notes (Signed)
[facility] EMERGENCY DEPARTMENT Provider Note   CSN: [id number] Arrival date & time: 08/04/20  0058     History Chief Complaint  Patient presents with   Facial Swelling    Benjamin Solomon is a 55 y.o. male.  Patient presents to ER chief complaint of facial swelling and cellulitis off and on for the past 3 months.  He states is getting worse over the course the last week.  He has been followed by outpatient ENT with [facility].  He had a CT scan done yesterday that was concerning for an abscess on the left side of the nose and he was sent to the ER.  He has finished multiple doses of various antibiotics, most recently finishing clindamycin 2 days ago as well as a course of steroids 2 days ago.  He states he started to develop fever since yesterday subjective T-max.  Otherwise denies any vomiting or cough or diarrhea.      Past Medical History:  Diagnosis Date   Anxiety    Arthritis    Benign prostatic hyperplasia with lower urinary tract symptoms    Carpal tunnel syndrome, right upper limb    Chronic back pain    Depression    Essential (primary) hypertension    Hemorrhoids    Hyperplastic colon polyp    Hypertension    Low back pain    Major depressive disorder, single episode, unspecified    Opioid dependence ([place])    Tubular adenoma    Unspecified osteoarthritis, unspecified site     Patient Active Problem List   Diagnosis Date Noted   Essential (primary) hypertension    Low back pain    Major depressive disorder, single episode, unspecified    Benign prostatic hyperplasia with lower urinary tract symptoms    Carpal tunnel syndrome, right upper limb    Unspecified osteoarthritis, unspecified site    S/P lumbar spinal fusion 08/07/2016   Generalized anxiety disorder 10/04/2012   Alcohol dependence ([place]) 06/26/2012   Cocaine dependence ([place]) 06/26/2012   Depressive disorder, not elsewhere classified 06/26/2012   Bleeding external  hemorrhoids 12/17/2011    Past Surgical History:  Procedure Laterality Date   CARPAL TUNNEL RELEASE Right 02/17/2018   Procedure: CARPAL TUNNEL RELEASE;  Surgeon: Daryll Brod, MD;  Location: [place];  Service: Orthopedics;  Laterality: Right;   CERVICAL SPINE SURGERY  2009   COLONOSCOPY     EVALUATION UNDER ANESTHESIA WITH HEMORRHOIDECTOMY  12/20/2011   FOOT SURGERY Right 2012   bone removed from the 5 th toe       Family History  Problem Relation Age of Onset   Hypertension Mother    Hypertension Father    Alcohol abuse Brother    Drug abuse Brother    Diabetes Neg Hx    Heart disease Neg Hx    Kidney disease Neg Hx    Liver disease Neg Hx     Social History   Tobacco Use   Smoking status: Every Day    Packs/day: 0.25    Years: 20.00    Pack years: 5.00    Types: Cigarettes   Smokeless tobacco: Never  Vaping Use   Vaping Use: Never used  Substance Use Topics   Alcohol use: No    Alcohol/week: 0.0 standard drinks   Drug use: No    Home Medications Prior to Admission medications   Medication Sig Start Date End Date Taking? Authorizing Provider  ALPRAZolam Duanne Moron) 0.5 MG  tablet Take 0.5 mg by mouth at bedtime as needed for sleep.  06/02/16   [provider]  amLODipine (NORVASC) 5 MG tablet Take 5 mg by mouth daily as needed (for high blood pressure).  01/27/13   [provider]  buPROPion (WELLBUTRIN XL) 300 MG 24 hr tablet Take 300 mg by mouth daily. 05/18/16   [provider]  clindamycin (CLEOCIN T) 1 % external solution Apply 1 application topically 2 (two) times daily as needed (boils).  10/13/15   [provider]  clindamycin (CLEOCIN) 300 MG capsule Take 300 mg by mouth 2 (two) times daily as needed (boils).  10/12/15   [provider]  cyclobenzaprine (FLEXERIL) 5 MG tablet Take 1 tablet (5 mg total) by mouth 3 (three) times daily as needed for muscle spasms. 06/04/18   Evalee Jefferson, PA-C  docusate sodium  (COLACE) 100 MG capsule Take 200 mg by mouth daily.    [provider]  doxycycline (VIBRAMYCIN) 100 MG capsule Take 1 capsule (100 mg total) by mouth 2 (two) times daily. 03/05/20   Triplett, Tammy, PA-C  FLUoxetine (PROZAC) 20 MG tablet Take 20 mg by mouth daily.    [provider]  lidocaine (XYLOCAINE) 2 % solution Use as directed 15 mLs in the mouth or throat every 4 (four) hours as needed for mouth pain (mix with nystatin gargle and spit). 05/11/20   Scot Jun, FNP  lisinopril-hydrochlorothiazide (PRINZIDE,ZESTORETIC) 20-12.5 MG per tablet Take 1 tablet by mouth daily.    [provider]  methocarbamol (ROBAXIN) 500 MG tablet Take 500 mg by mouth 3 (three) times daily.    [provider]  nystatin (MYCOSTATIN) 100000 UNIT/ML suspension Take 5 mLs (500,000 Units total) by mouth every 4 (four) hours as needed (add lidocaine viscous 15 ml mix gargle and spit). 05/11/20   Scot Jun, FNP  oxyCODONE-acetaminophen (PERCOCET) 10-325 MG tablet Take 1 tablet by mouth every 4 (four) hours as needed for pain. 08/08/16   Eustace Moore, MD  OXYCONTIN 20 MG 12 hr tablet Take 20 mg by mouth every 12 (twelve) hours.  06/25/16   [provider]  pseudoephedrine (SUDAFED) 30 MG tablet Take 1 tablet (30 mg total) by mouth every 4 (four) hours as needed for congestion. 05/11/20   Scot Jun, FNP  tamsulosin (FLOMAX) 0.4 MG CAPS capsule Take 0.4 mg by mouth daily.    [provider]    Allergies    Patient has no known allergies.  Review of Systems   Review of Systems  Constitutional:  Negative for chills.  HENT:  Negative for ear pain and sore throat.   Eyes:  Negative for pain.  Respiratory:  Negative for cough.   Cardiovascular:  Negative for chest pain.  Gastrointestinal:  Negative for abdominal pain.  Genitourinary:  Negative for flank pain.  Musculoskeletal:  Negative for back pain.  Skin:  Negative for color change and rash.   Neurological:  Negative for syncope.  All other systems reviewed and are negative.  Physical Exam Updated Vital Signs BP (!) 164/95   Pulse 92   Temp 98.5 F (36.9 C) (Oral)   Resp 16   Ht '5\' 8"'$  (1.727 m)   Wt 70.3 kg   SpO2 100%   BMI 23.57 kg/m   Physical Exam Constitutional:      General: He is not in acute distress.    Appearance: He is well-developed.  HENT:     Head: Normocephalic.  Nose: Nose normal.  Eyes:     Extraocular Movements: Extraocular movements intact.  Cardiovascular:     Rate and Rhythm: Normal rate.  Pulmonary:     Effort: Pulmonary effort is normal.  Skin:    Coloration: Skin is not jaundiced.     Comments: Swelling induration and tenderness across the bridge of the nose.  Maximal in the left side of the nose.  Neurological:     Mental Status: He is alert. Mental status is at baseline.         ED Results / Procedures / Treatments   Labs (all labs ordered are listed, but only abnormal results are displayed) Labs Reviewed  CBC WITH DIFFERENTIAL/PLATELET - Abnormal; Notable for the following components:      Result Value   WBC 19.1 (*)    RBC 4.10 (*)    Hemoglobin 12.3 (*)    HCT 36.5 (*)    Neutro Abs 13.9 (*)    Monocytes Absolute 1.8 (*)    All other components within normal limits  BASIC METABOLIC PANEL - Abnormal; Notable for the following components:   Potassium 3.2 (*)    Glucose, Bld 108 (*)    Calcium 8.5 (*)    All other components within normal limits  CULTURE, BLOOD (ROUTINE X 2)  CULTURE, BLOOD (ROUTINE X 2)  RESP PANEL BY RT-PCR (FLU A&B, COVID) ARPGX2    EKG None  Radiology No results found.  Procedures Procedures   Medications Ordered in ED Medications  vancomycin (VANCOREADY) IVPB 1500 mg/300 mL (1,500 mg Intravenous New Bag/Given 08/04/20 0916)  potassium chloride 10 mEq in 100 mL IVPB (10 mEq Intravenous New Bag/Given 08/04/20 0920)  dexamethasone (DECADRON) injection 8 mg (8 mg Intravenous Given  08/04/20 0911)  nicotine (NICODERM CQ - dosed in mg/24 hours) patch 21 mg (21 mg Transdermal Patch Applied 08/04/20 0836)  morphine 4 MG/ML injection 4 mg (4 mg Intravenous Given 08/04/20 0833)  ondansetron (ZOFRAN) injection 4 mg (4 mg Intravenous Given 08/04/20 0832)  lisinopril (ZESTRIL) tablet 20 mg (20 mg Oral Given 08/04/20 0837)  hydrochlorothiazide (MICROZIDE) capsule 12.5 mg (12.5 mg Oral Given 08/04/20 [id number])    ED Course  I have reviewed the triage vital signs and the nursing notes.  Pertinent labs & imaging results that were available during my care of the patient were reviewed by me and considered in my medical decision making (see chart for details).    MDM Rules/Calculators/A&P                           Case discussed with on-call ENT Dr.Skotnicki who will see the patient. Medicine has been consulted for admission.  Patient started on broad-spectrum antibiotics.   Final Clinical Impression(s) / ED Diagnoses Final diagnoses:  Facial cellulitis  Facial abscess    Rx / DC Orders ED Discharge Orders     None        Luna Fuse, MD 08/04/20 [phone]

## 2020-08-04 NOTE — ED Provider Notes (Addendum)
Emergency Medicine Provider Triage Evaluation Note  Benjamin Solomon , a 55 y.o. male  was evaluated in triage.  Pt complains of recurrent facial swelling. Has been having recurrent symptoms since February. Initially has improvement with steroids and antibiotics. Most recently, swelling worsening over the past few weeks with extension to the right side of his nose acutely this evening. Currently prescribed doxycycline which he has been taking. Followed by Dr. Warren Lacy, ENT at [facility].  Had CT yesterday which showed focal 2.1 x 1.6 x 2.9 cm abscess to the left side of the nose which could come from skin of the nose, nasal mucosa orpossibly the left lacrimal apparatus, or the left maxillary teeth (Care Everywhere)  Review of Systems  Positive: Facial swelling Negative: Fever, difficulty breathing, nasal drainage  Physical Exam  BP (!) 151/93 (BP Location: Left Arm)   Pulse 100   Temp 98.6 F (37 C) (Oral)   Resp (!) 22   Ht '5\' 8"'$  (1.727 m)   Wt 70.3 kg   SpO2 94%   BMI 23.57 kg/m  Gen:   Awake, no distress   Resp:  Normal effort  MSK:   Moves extremities without difficulty  Other:  Facial swelling at the nasal bridge extending to under the eyes bilaterally. B/l nares patent.  Medical Decision Making  Medically screening exam initiated at 1:16 AM.  Appropriate orders placed.  [name] was informed that the remainder of the evaluation will be completed by another provider, this initial triage assessment does not replace that evaluation, and the importance of remaining in the ED until their evaluation is complete.  Facial cellulitis/abscess   Antonietta Breach, PA-C 08/04/20 0119    Antonietta Breach, PA-C 08/04/20 Mare Ferrari, MD 08/04/20 [phone]

## 2020-08-04 NOTE — Anesthesia Preprocedure Evaluation (Addendum)
Anesthesia Evaluation  Patient identified by MRN, date of birth, ID band Patient awake    Reviewed: Allergy & Precautions, NPO status , Patient's Chart, lab work & pertinent test results  Airway Mallampati: II  TM Distance: >3 FB Neck ROM: Full    Dental  (+) Dental Advisory Given   Pulmonary Current Smoker and Patient abstained from smoking.,    breath sounds clear to auscultation       Cardiovascular hypertension, Pt. on medications  Rhythm:Regular Rate:Normal     Neuro/Psych negative neurological ROS     GI/Hepatic negative GI ROS, Neg liver ROS,   Endo/Other  negative endocrine ROS  Renal/GU negative Renal ROS     Musculoskeletal   Abdominal   Peds  Hematology  (+) anemia ,   Anesthesia Other Findings   Reproductive/Obstetrics                             Anesthesia Physical Anesthesia Plan  ASA: 3  Anesthesia Plan: General   Post-op Pain Management:    Induction: Intravenous  PONV Risk Score and Plan: 1 and Dexamethasone, Ondansetron and Treatment may vary due to age or medical condition  Airway Management Planned: Oral ETT  Additional Equipment:   Intra-op Plan:   Post-operative Plan: Extubation in OR  Informed Consent: I have reviewed the patients History and Physical, chart, labs and discussed the procedure including the risks, benefits and alternatives for the proposed anesthesia with the patient or authorized representative who has indicated his/her understanding and acceptance.     Dental advisory given  Plan Discussed with: CRNA  Anesthesia Plan Comments:        Anesthesia Quick Evaluation

## 2020-08-04 NOTE — Anesthesia Procedure Notes (Signed)
Procedure Name: Intubation Date/Time: 08/04/2020 6:21 PM Performed by: Murvin Natal, MD Pre-anesthesia Checklist: Patient identified, Emergency Drugs available, Suction available and Patient being monitored Patient Re-evaluated:Patient Re-evaluated prior to induction Oxygen Delivery Method: Circle system utilized Preoxygenation: Pre-oxygenation with 100% oxygen Induction Type: IV induction Ventilation: Mask ventilation without difficulty Laryngoscope Size: Mac and 4 Grade View: Grade II Tube type: Oral Tube size: 7.5 mm Number of attempts: 1 Airway Equipment and Method: Stylet Placement Confirmation: ETT inserted through vocal cords under direct vision, positive ETCO2 and breath sounds checked- equal and bilateral Secured at: 24 cm Tube secured with: Tape Dental Injury: Teeth and Oropharynx as per pre-operative assessment  Comments: Intubation SRNA

## 2020-08-04 NOTE — ED Notes (Signed)
Patient transported to x-ray. ?

## 2020-08-04 NOTE — Consult Note (Signed)
H&P Infection  Exam Date: 08/04/20  ID: The patient is a 76yoM who presented with pain and swelling of the bilateral midface/nose/eyes space.  History of Present Illness:  The patient reports having intermittent left facial swelling that comes and goes every few months dating back to Feb 2022. He has been treated with multiple courses of abx and steroids and follows with an ENT provider in [place]. No source for the swelling has every been found. Recently he had another epidosde, reportedly worse than previous occasions and the swelling has extended to the right side of his face as well as around his eyes. Denies visual changes. He is planned for the OR today for I&D with ENT and due to concern for odontogenic source would like OMS consult. He denies any recent dental pain, does reports he has a fractured tooth on the upper left for some time.   Clinical Exam: Extraoral Exam:              Patient is alert, orientated and in mild distress             CN II-XII intact             There is appreciable facial swelling on the face bilaterally  in the canine space extending beyond the bridge of the nose and extending to the periorbital region side of the face.    Intraoral Exam:              The patient does have trismus with maximum incisal opening of 50 mm.             The maxillary vestibule is minimally raised.             Oral airway is patent, no palatal/phayngeal swelling/edema   FOM is soft, non-elevated             Gross decay noted on #11 which is tender to palpation     Radiographic Exam:               Panorex shows carious #11, with periapical radiolucencies associated with #11.                          A CT of the larynx was done at [place] [facility] apparently which revealed abscess (per Chart Review).   Assessment: 74 yoM patient with facial cellulitis of possible odontogenic origin.    Plan:  The patient is planned for I&D for ENT today. I will join for removal of tooth #11  while in the OR. Consent obtained.   Risks, complications and alternatives of tooth extraction and incision and drainage procedures were discussed and questions were answered.  Among all potential risks and complications, I emphasized the potential for pain, bleeding, swelling, infection, localized alveolar osteitis (dry socket), temporary and permanent lingual and inferior alveolar nerve injury, oroantral (sinus) communication, oronasal communication, jaw fracture, damage to adjacent teeth and tissue, joint discomfort, bone/tooth fragments, recurrence of infection, need for additional procedures, facial nerve injury, scarring, limited mouth opening, drain placement, aspiration and anesthetic mishap.   Terese Door, DDS

## 2020-08-04 NOTE — Op Note (Addendum)
OPERATIVE NOTE  Benjamin Solomon Date/Time of Admission: 08/04/2020  1:04 AM  CSN: [id number] Attending Provider: Lucious Groves, DO Room/Bed: MCPO/NONE DOB: [birth year] Age: 55 y.o.   Pre-Op Diagnosis: Facial Abscess  Post-Op Diagnosis: Facial Abscess  Procedure: Procedure(s): INTRAORAL INCISION AND DRAINAGE ABSCESS [id number]) NASAL ENDOSCOPY [id number])  Anesthesia: General  Surgeon(s): Shona Pardo A Pawel Soules, DO  Staff: Circulator: Pelance, Bubba Hales, RN; Mallie Snooks, RN Scrub Person: Rozell Searing, RN  Implants: * No implants in log *  Specimens: ID Type Source Tests Collected by Time Destination  A : facial abscess Abscess Abscess AEROBIC CULTURE W GRAM STAIN (SUPERFICIAL SPECIMEN), ANAEROBIC CULTURE W GRAM STAIN Lamesha Tibbits A, DO [id number]     Complications: None  EBL: 5 ML  Condition: stable  Operative Findings:  Large facial abscess over left maxilla and nasal bones, secondary to odontogenic infection  Description of Operation: Once operative consent was obtained, and the surgical site was confirmed with the patient and the operating room team, the patient was brought back to the operating room and general endotracheal anesthesia was obtained. The patient was turned over to the ENT service and the patient was prepped and draped in sterile fashion. Attention was turned to the oral cavity. 1% Lidocaine with 1:100,000 Epinephrine was injected in the upper lip vestibule. An incision was made in the left upper lip vestibule with a needle tip Bovie 4 cm above the gingival attachment to bone. A hemostat was used to enter into the palpable area of fluctuance and copious purulence was immediately encountered. Cultures were obtained. Manual pressure was used to facilitate drainage of purulence overlying the nasal bones and hemostat was used to break up loculations. After no additional purulence was encountered, the abscess cavity was copiously irrigated  and a 1/4 inch penrose drain was placed in the abscess cavity to allow for further drainage. This was secured to the mucosa with a 4-0 nylon suture. A 4-0 chromic suture was used to approximate the buccal mucosa. Attention was then turned to the nasal cavity. A zero degree endoscope was used to visualize the left nasal cavity. There was no evidence of mucosal abnormality, mass lesion, or purulent intranasal drainage. The patient was then turned over to oral surgery for extraction of the involved dentition. Please see his dictation for that portion of the case. An orogastric tube was passed and the stomach was suctioned to reduce post operative nausea. The patient was extubated in the OR and transferred to the PACU in stable condition.    Jason Coop, [facility]  08/04/2020

## 2020-08-04 NOTE — Transfer of Care (Signed)
Immediate Anesthesia Transfer of Care Note  Patient: Benjamin Solomon  Procedure(s) Performed: INCISION AND DRAINAGE ABSCESS NASAL ENDOSCOPY DENTAL EXTRACTION TOOTH #11  Patient Location: PACU  Anesthesia Type:General  Level of Consciousness: drowsy and patient cooperative  Airway & Oxygen Therapy: Patient Spontanous Breathing  Post-op Assessment: Report given to RN and Post -op Vital signs reviewed and stable  Post vital signs: Reviewed and stable  Last Vitals:  Vitals Value Taken Time  BP 155/97 08/04/20 1910  Temp 36.4 C 08/04/20 1910  Pulse 95 08/04/20 1917  Resp 16 08/04/20 1917  SpO2 98 % 08/04/20 1917  Vitals shown include unvalidated device data.  Last Pain:  Vitals:   08/04/20 1539  TempSrc: Oral  PainSc: 8       Patients Stated Pain Goal: 5 ([id number])  Complications: No notable events documented.

## 2020-08-04 NOTE — Consult Note (Signed)
[facility]    Date of Admission:  08/04/2020     Reason for Consult: facial abscess    Referring Provider: Heber Mansfield   Lines:  Peripheral IV's  Abx: 7/22-c dapto 7/22-c amp-sulbactam  7/22 vanc/piptazo      Recent dexamethasone/clinda--> doxy outpatient  Assessment:  55 y.o. male smoker without diabetes mellitus, recurrent left face swelling/cellulitis admitted for same in setting of imaging face showing an abscess  Based on ct description and exam, suspect odonotgenic in nature Ent planning OR debridement today  Bcx in progress  Patient nondiabetic and facial sinus ct without suggest of sinusitis or concern for fungal infection (in setting of frequent dexamethasone courses recently)  On lisinopril but doesn't appear to have any suggestion of angioedema. Do not think this is orbital pseudotumor. Doesn't appear to midline necrotizing process or lymphoma  Will check hiv for health maintenance purpose   Plan: Continue abx with daptomycin/amp-sulbactam ENT to perform I&D -- please send for path/culture  Hiv testing Will need dental evaluation to remove carrious teeth Discussed with ent/primary team  I spent 60 minute reviewing data/chart, and coordinating care and >50% direct face to face time providing counseling/discussing diagnostics/treatment plan with patient     ------------------------------------------------ Active Problems:   Facial abscess    HPI: SYLVANUS HULLUM is a 55 y.o. male smoker without diabetes mellitus, recurrent left face swelling/cellulitis admitted for same in setting of imaging face showing an abscess  Patient has been followed by ENT from [place]. No previous id evaluation. He reported a couple episodes of facial swelling/pain left side in 02/2020 and 04/2020. He doesn't recall getting abx then but was given dexamethasone. These spontaneously resolved  He had recurrent pain/swellign associated  with subjective chill starting this month. He was given 2 courses of abx therapy including dexamethasone by ent. Abx was clinda --> doxy.  He has progression of sx prompting sinus ct done 7/21 which showed a left sided facial abscess, so referred for admission  He has been on lisinopril for years. No recurrent abd pain, change in taste in mouth, tongue/oral swelling, dyspnea/sob/wheezing  He has no weight loss  He denies family hx autoimmune disease  He has bad upper left sided teeth in proximity to the abscess but hasn't had it pulled  I reviewed imaging result from outside facility and notes by ent. The abscess is not clear in terms of odontogenic/sinus related vs skin  He hasn't had recurrent boils/rash other places  On admission his wbc is 19 Ent to see patient today  He was given a dose vanc/piptazo in the ED. Bcx is cooking  He has no other sx or focal pain    Family History  Problem Relation Age of Onset   Hypertension Mother    Hypertension Father    Alcohol abuse Brother    Drug abuse Brother    Diabetes Neg Hx    Heart disease Neg Hx    Kidney disease Neg Hx    Liver disease Neg Hx     Social History   Tobacco Use   Smoking status: Every Day    Packs/day: 0.25    Years: 20.00    Pack years: 5.00    Types: Cigarettes   Smokeless tobacco: Never  Vaping Use   Vaping Use: Never used  Substance Use Topics   Alcohol use: No    Alcohol/week: 0.0 standard drinks   Drug use: No  No Known Allergies  Review of Systems: ROS All Other ROS was negative, except mentioned above   Past Medical History:  Diagnosis Date   Anxiety    Arthritis    Benign prostatic hyperplasia with lower urinary tract symptoms    Carpal tunnel syndrome, right upper limb    Chronic back pain    Depression    Essential (primary) hypertension    Hemorrhoids    Hyperplastic colon polyp    Hypertension    Low back pain    Major depressive disorder, single episode, unspecified     Opioid dependence (HCC)    Tubular adenoma    Unspecified osteoarthritis, unspecified site        Scheduled Meds:  dexamethasone (DECADRON) injection  8 mg Intravenous Q8H   enoxaparin (LOVENOX) injection  40 mg Subcutaneous Q24H   morphine  15 mg Oral Q12H   nicotine  21 mg Transdermal Once   Continuous Infusions:  ampicillin-sulbactam (UNASYN) IV     DAPTOmycin (CUBICIN)  IV     PRN Meds:.oxyCODONE-acetaminophen   OBJECTIVE: Blood pressure 129/81, pulse 92, temperature 100 F (37.8 C), resp. rate 15, height '5\' 8"'$  (1.727 m), weight 70.3 kg, SpO2 100 %.  Physical Exam General/constitutional: no distress, pleasant HEENT: anterior facial swelling focal at nasal bridge extending up to forehead; tender to palpation; clears left eye nasal discharge, PER, Conj Clear, EOMI, Oropharynx with lost upper tooth and next to it is a badly carrious tooth without obvious dental abscess Neck supple CV: rrr no mrg Lungs: clear to auscultation, normal respiratory effort Abd: Soft, Nontender Ext: no edema Skin: No Rash Neuro: nonfocal MSK: no peripheral joint swelling/tenderness/warmth; back spines nontender Psych: alert/oriented  Central line presence: no    Lab Results Lab Results  Component Value Date   WBC 19.1 (H) 08/04/2020   HGB 12.3 (L) 08/04/2020   HCT 36.5 (L) 08/04/2020   MCV 89.0 08/04/2020   PLT 300 08/04/2020    Lab Results  Component Value Date   CREATININE 1.10 08/04/2020   BUN 15 08/04/2020   NA 136 08/04/2020   K 3.2 (L) 08/04/2020   CL 104 08/04/2020   CO2 24 08/04/2020    Lab Results  Component Value Date   ALT 15 11/13/2013   AST 15 11/13/2013   ALKPHOS 101 11/13/2013   BILITOT 0.3 11/13/2013      Microbiology: Recent Results (from the past 240 hour(s))  Resp Panel by RT-PCR (Flu A&B, Covid) Nasopharyngeal Swab     Status: None   Collection Time: 08/04/20  8:48 AM   Specimen: Nasopharyngeal Swab; Nasopharyngeal(NP) swabs in vial transport  medium  Result Value Ref Range Status   SARS Coronavirus 2 by RT PCR NEGATIVE NEGATIVE Final    Comment: (NOTE) SARS-CoV-2 target nucleic acids are NOT DETECTED.  The SARS-CoV-2 RNA is generally detectable in upper respiratory specimens during the acute phase of infection. The lowest concentration of SARS-CoV-2 viral copies this assay can detect is 138 copies/mL. A negative result does not preclude SARS-Cov-2 infection and should not be used as the sole basis for treatment or other patient management decisions. A negative result may occur with  improper specimen collection/handling, submission of specimen other than nasopharyngeal swab, presence of viral mutation(s) within the areas targeted by this assay, and inadequate number of viral copies(<138 copies/mL). A negative result must be combined with clinical observations, patient history, and epidemiological information. The expected result is Negative.  Fact Sheet for Patients:  EntrepreneurPulse.com.au  Fact  Sheet for Healthcare Providers:  IncredibleEmployment.be  This test is no t yet approved or cleared by the [place] FDA and  has been authorized for detection and/or diagnosis of SARS-CoV-2 by FDA under an Emergency Use Authorization (EUA). This EUA will remain  in effect (meaning this test can be used) for the duration of the COVID-19 declaration under Section 564(b)(1) of the Act, 21 U.S.C.section 360bbb-3(b)(1), unless the authorization is terminated  or revoked sooner.       Influenza A by PCR NEGATIVE NEGATIVE Final   Influenza B by PCR NEGATIVE NEGATIVE Final    Comment: (NOTE) The Xpert Xpress SARS-CoV-2/FLU/RSV plus assay is intended as an aid in the diagnosis of influenza from Nasopharyngeal swab specimens and should not be used as a sole basis for treatment. Nasal washings and aspirates are unacceptable for Xpert Xpress SARS-CoV-2/FLU/RSV testing.  Fact Sheet for  Patients: EntrepreneurPulse.com.au  Fact Sheet for Healthcare Providers: IncredibleEmployment.be  This test is not yet approved or cleared by the [place] FDA and has been authorized for detection and/or diagnosis of SARS-CoV-2 by FDA under an Emergency Use Authorization (EUA). This EUA will remain in effect (meaning this test can be used) for the duration of the COVID-19 declaration under Section 564(b)(1) of the Act, 21 U.S.C. section 360bbb-3(b)(1), unless the authorization is terminated or revoked.  Performed at [facility], [place] [address] [place], [place] [address]      Serology:    Imaging: If present, new imagings (plain films, ct scans, and mri) have been personally visualized and interpreted; radiology reports have been reviewed. Decision making incorporated into the Impression / Recommendations.  Outpatient facial ct scan from 7/21 (await images upload into our system) FINDINGS:  Abscess: There is a focal 2.1 x 1.6 x 2.9 cm abscess, involving the left side of the nose. There is skin thickening in the nodes. There is adjacency to the mucosa at the nasal apertures, but could alternatively be related to the skin of the nose, or less likely the lacrimal apparatus, or the left maxillary teeth. No significant sinus disease cc below). There are some carries at the left maxillary lateral incisor and the left maxillary canine, without periapical cyst or cortical breakthrough in the alveolus however.   Paranasal sinuses:  #  Frontal sinuses:Clear., Who is a recesses are patent, though mildly narrowed.  #  Ethmoid sinuses: Clear  #  Maxillary sinuses: Clear bilaterally., Ostiomeatal units are narrowed but patent.  #  Sphenoid sinuses: Clear., The sphenoethmoidal recesses are patent.   Nasal cavity:Nasal septum is straight. Turbinates are normal. Nasal cycle on the left.   Orbits: Mild proptosis. Thickening of the soft tissues in  the cheek and eyelids. The gaze is slightly divergent but the eyes could be closed.  Orbits otherwise benign   Oral Cavity: No significant dental disease., Torus on the bony palate.  Caries, as above and missing teeth.   Other findings: None of significance.    Jabier Mutton, [facility] [phone] pager    08/04/2020, 12:36 PM

## 2020-08-04 NOTE — Consult Note (Signed)
ENT CONSULT:  Reason for Consult: Facial abscess  Referring Physician:  Dr. Almyra Free  HPI: Benjamin Solomon is an 55 y.o. male who presented to ED for evaluation upon recommendation by his ENT in [facility], Dr. Warren Lacy.  Patient states that he has been seen by ENT in [place] over the last several months for recurrent facial swelling, worse on the left.  He states that this first started in February, and initially improved with treatment with oral antibiotics and steroids, but then relapsed again in March.  Since then he states that he has been on several different oral antibiotics as well as steroids without complete resolution of his facial swelling and pain.  Imaging obtained at outside hospital yesterday reports 2.1 x 1.6 x 2.8 cm abscess of the left aspect of the nose.  He reports known history of a broken left maxillary tooth, but denies recent history of facial trauma or injury.   Past Medical History:  Diagnosis Date   Anxiety    Arthritis    Benign prostatic hyperplasia with lower urinary tract symptoms    Carpal tunnel syndrome, right upper limb    Chronic back pain    Depression    Essential (primary) hypertension    Hemorrhoids    Hyperplastic colon polyp    Hypertension    Low back pain    Major depressive disorder, single episode, unspecified    Opioid dependence ([place])    Tubular adenoma    Unspecified osteoarthritis, unspecified site     Past Surgical History:  Procedure Laterality Date   CARPAL TUNNEL RELEASE Right 02/17/2018   Procedure: CARPAL TUNNEL RELEASE;  Surgeon: Daryll Brod, MD;  Location: [facility];  Service: Orthopedics;  Laterality: Right;   CERVICAL SPINE SURGERY  2009   COLONOSCOPY     EVALUATION UNDER ANESTHESIA WITH HEMORRHOIDECTOMY  12/20/2011   FOOT SURGERY Right 2012   bone removed from the 5 th toe    Family History  Problem Relation Age of Onset   Hypertension Mother    Hypertension Father    Alcohol abuse Brother    Drug  abuse Brother    Diabetes Neg Hx    Heart disease Neg Hx    Kidney disease Neg Hx    Liver disease Neg Hx     Social History:  reports that he has been smoking cigarettes. He has a 5.00 pack-year smoking history. He has never used smokeless tobacco. He reports that he does not drink alcohol and does not use drugs.  Allergies: No Known Allergies  Medications: I have reviewed the patient's current medications.  Results for orders placed or performed during the hospital encounter of 08/04/20 (from the past 48 hour(s))  CBC with Differential     Status: Abnormal   Collection Time: 08/04/20  1:18 AM  Result Value Ref Range   WBC 19.1 (H) 4.0 - 10.5 K/uL   RBC 4.10 (L) 4.22 - 5.81 MIL/uL   Hemoglobin 12.3 (L) 13.0 - 17.0 g/dL   HCT 36.5 (L) 39.0 - 52.0 %   MCV 89.0 80.0 - 100.0 fL   MCH 30.0 26.0 - 34.0 pg   MCHC 33.7 30.0 - 36.0 g/dL   RDW 14.5 11.5 - 15.5 %   Platelets 300 150 - 400 K/uL   nRBC 0.0 0.0 - 0.2 %   Neutrophils Relative % 73 %   Neutro Abs 13.9 (H) 1.7 - 7.7 K/uL   Lymphocytes Relative 17 %   Lymphs Abs 3.2  0.7 - 4.0 K/uL   Monocytes Relative 10 %   Monocytes Absolute 1.8 (H) 0.1 - 1.0 K/uL   Eosinophils Relative 0 %   Eosinophils Absolute 0.1 0.0 - 0.5 K/uL   Basophils Relative 0 %   Basophils Absolute 0.0 0.0 - 0.1 K/uL   Immature Granulocytes 0 %   Abs Immature Granulocytes 0.06 0.00 - 0.07 K/uL    Comment: Performed at [facility] [address], [place], [place] [id number]  Basic metabolic panel     Status: Abnormal   Collection Time: 08/04/20  1:18 AM  Result Value Ref Range   Sodium 136 135 - 145 mmol/L   Potassium 3.2 (L) 3.5 - 5.1 mmol/L   Chloride 104 98 - 111 mmol/L   CO2 24 22 - 32 mmol/L   Glucose, Bld 108 (H) 70 - 99 mg/dL    Comment: Glucose reference range applies only to samples taken after fasting for at least 8 hours.   BUN 15 6 - 20 mg/dL   Creatinine, Ser 1.10 0.61 - 1.24 mg/dL   Calcium 8.5 (L) 8.9 - 10.3 mg/dL   GFR, Estimated  >60 >60 mL/min    Comment: (NOTE) Calculated using the CKD-EPI Creatinine Equation (2021)    Anion gap 8 5 - 15    Comment: Performed at [facility] [address] [place], [place] [address]  Resp Panel by RT-PCR (Flu A&B, Covid) Nasopharyngeal Swab     Status: None   Collection Time: 08/04/20  8:48 AM   Specimen: Nasopharyngeal Swab; Nasopharyngeal(NP) swabs in vial transport medium  Result Value Ref Range   SARS Coronavirus 2 by RT PCR NEGATIVE NEGATIVE    Comment: (NOTE) SARS-CoV-2 target nucleic acids are NOT DETECTED.  The SARS-CoV-2 RNA is generally detectable in upper respiratory specimens during the acute phase of infection. The lowest concentration of SARS-CoV-2 viral copies this assay can detect is 138 copies/mL. A negative result does not preclude SARS-Cov-2 infection and should not be used as the sole basis for treatment or other patient management decisions. A negative result may occur with  improper specimen collection/handling, submission of specimen other than nasopharyngeal swab, presence of viral mutation(s) within the areas targeted by this assay, and inadequate number of viral copies(<138 copies/mL). A negative result must be combined with clinical observations, patient history, and epidemiological information. The expected result is Negative.  Fact Sheet for Patients:  EntrepreneurPulse.com.au  Fact Sheet for Healthcare Providers:  IncredibleEmployment.be  This test is no t yet approved or cleared by the [place] FDA and  has been authorized for detection and/or diagnosis of SARS-CoV-2 by FDA under an Emergency Use Authorization (EUA). This EUA will remain  in effect (meaning this test can be used) for the duration of the COVID-19 declaration under Section 564(b)(1) of the Act, 21 U.S.C.section 360bbb-3(b)(1), unless the authorization is terminated  or revoked sooner.       Influenza A by PCR NEGATIVE  NEGATIVE   Influenza B by PCR NEGATIVE NEGATIVE    Comment: (NOTE) The Xpert Xpress SARS-CoV-2/FLU/RSV plus assay is intended as an aid in the diagnosis of influenza from Nasopharyngeal swab specimens and should not be used as a sole basis for treatment. Nasal washings and aspirates are unacceptable for Xpert Xpress SARS-CoV-2/FLU/RSV testing.  Fact Sheet for Patients: EntrepreneurPulse.com.au  Fact Sheet for Healthcare Providers: IncredibleEmployment.be  This test is not yet approved or cleared by the [place] FDA and has been authorized for detection and/or  diagnosis of SARS-CoV-2 by FDA under an Emergency Use Authorization (EUA). This EUA will remain in effect (meaning this test can be used) for the duration of the COVID-19 declaration under Section 564(b)(1) of the Act, 21 U.S.C. section 360bbb-3(b)(1), unless the authorization is terminated or revoked.  Performed at [facility], [place] [address] [place], [place] [address]   HIV Antibody (routine testing w rflx)     Status: None   Collection Time: 08/04/20 10:25 AM  Result Value Ref Range   HIV Screen 4th Generation wRfx Non Reactive Non Reactive    Comment: Performed at [facility], [place] [address], [place], [place] [address]    DG Orthopantogram  Result Date: 08/04/2020 CLINICAL DATA:  Facial swelling for 1 week. Pain in mouth. Abscess. EXAM: ORTHOPANTOGRAM/PANORAMIC COMPARISON:  CT maxillofacial 03/05/2020 FINDINGS: Dental work again noted. No significant periapical lucencies to suggest abscess. No acute or focal osseous abnormality is present. Mandible is intact and located. IMPRESSION: Dental work without evidence for abscess. No acute or focal etiology of the patient's swelling. Electronically Signed   By: San Morelle M.D.   On: 08/04/2020 14:50    ROS:ROS  Blood pressure 126/80, pulse 87, temperature 98.6 F (37 C), temperature source Oral, resp. rate 17,  height '5\' 8"'$  (1.727 m), weight 70.3 kg, SpO2 98 %.  PHYSICAL EXAM: CONSTITUTIONAL: well developed, nourished, mild distress and alert and oriented x 3 PULMONARY/CHEST WALL: effort normal and no stridor, no stertor, no dysphonia HENT: Head : Edema noted over central forehead with small area of palpable fluctuance measuring approximately 1 cm.  Significant diffuse facial swelling, with bilateral periorbital edema limiting eye opening.  Extraocular movement appears intact bilaterally, PERRLA Ears: Right ear:   canal normal, external ear normal and hearing normal Left ear:   canal normal, external ear normal and hearing normal Nose: Diffuse edema with palpable fluctuance over the nasal bridge, with erythema of the overlying skin.  No evidence of intranasal purulence. Mouth/Throat:  Mouth: uvula midline and no oral lesions, #11 with obvious decay, tender to palpation.  Floor of mouth soft Throat: oropharynx clear and moist, no palatal edema Mucous membranes: normal NECK: supple, trachea normal and no thyromegaly or cervical LAD  Studies Reviewed: Report from outside CT reviewed, awaiting image transfer to view images directly  Assessment/Plan: Benjamin Solomon is a 55 y/o M with several month history of fluctuating left facial edema and pain, treated by ENT in [place] with several rounds of oral antibiotics and steroids without complete resolution.  Imaging obtained yesterday demonstrates focal abscess of soft tissue of nose, with no evidence of sinusitis.  Exam demonstrates palpable fluctuance over the nasal bridge, with no obvious cutaneous source of infection; significant decay of #11 noted. -Patient admitted to hospital medicine for IV antibiotics -Infectious disease consulted, antibiotics as per ID -To OR today for incision and drainage of facial abscess with nasal endoscopy.  Risks, possible complications, including bleeding, recurrent infection, injury to surrounding neurovascular  structures, cutaneous scarring, need for additional procedures, and alternatives of surgical procedure were discussed and all questions answered.  Patient expressed understanding and agreement. -Oral surgery also consulted, planning to perform extraction while patient in OR, appreciate assistance  Thank you for allowing me to participate in the care of this patient. Please do not hesitate to contact me with any questions or concerns.   Jason Coop, [facility] Cell: [phone]   08/04/2020, 4:31 PM

## 2020-08-04 NOTE — Anesthesia Postprocedure Evaluation (Signed)
Anesthesia Post Note  Patient: Benjamin Solomon  Procedure(s) Performed: INCISION AND DRAINAGE ABSCESS NASAL ENDOSCOPY DENTAL EXTRACTION TOOTH #11     Patient location during evaluation: PACU Anesthesia Type: General Level of consciousness: awake Pain management: pain level controlled Vital Signs Assessment: post-procedure vital signs reviewed and stable Respiratory status: spontaneous breathing, nonlabored ventilation, respiratory function stable and patient connected to nasal cannula oxygen Cardiovascular status: blood pressure returned to baseline and stable Postop Assessment: no apparent nausea or vomiting Anesthetic complications: no   No notable events documented.  Last Vitals:  Vitals:   08/04/20 2000 08/04/20 2018  BP: (!) 145/88 (!) 148/91  Pulse: 88 89  Resp: 12 16  Temp:  36.8 C  SpO2: 98% 98%    Last Pain:  Vitals:   08/04/20 2018  TempSrc: Oral  PainSc: 0-No pain                 Phoua Hoadley P Kaitlin Alcindor

## 2020-08-04 NOTE — Op Note (Signed)
PRE-OPERATIVE DIAGNOSIS:  Odontogenic infection POST-OPERATIVE DIAGNOSIS:  Same    SURGEON:  Surgeon(s) and Role:    Newt Lukes, DMD - Primary   Procedure: Surgical removal #11 ([id number] x 1)   Description of Operation/Procedure:   The patient was encountered in [facility] OR Room 8.  General anesthesia was induced and an ETT was secured in the standard fashion.  Patient was prepped and draped and I&D was performed by Dr. Fredric Dine via intraoral vestibular incision. 2% lidocaine with 1:100,000 epinephrine had been infiltrated into the proposed surgical sites. I then assisted in the case performing surgical extraction of tooth 0000000 without complication.  The area was curetted and irrigated thoroughly and it appeared was the source of the infection.  Care of the patient was turned over to the ENT and Anesthesia team for uneventful extubation and delivery of the patient to the PACU in stable condition.   ANESTHESIA:   general EBL:  minimal LOCAL MEDICATIONS USED:  LIDOCAINE 2% w/ 1:100000 epi PLAN OF CARE: Return to floor PATIENT DISPOSITION:  PACU - hemodynamically stable. Delay start of Pharmacological VTE agent (>24hrs) due to surgical blood loss or risk of bleeding: no   OMFS Recommendations -Soft mechanical diet, advance as tolerated -Peridex (chlorhexidine) mouthrnise BID -Antibiotics per ID/medicine -Please contact with any questions/concerns, OMFS will sign off  Terese Door, DDS Oral and Maxillofacial Surgeon [facility] Office # [phone] Cell # [phone]

## 2020-08-05 ENCOUNTER — Encounter (HOSPITAL_COMMUNITY): Payer: Self-pay | Admitting: Otolaryngology

## 2020-08-05 DIAGNOSIS — L03211 Cellulitis of face: Secondary | ICD-10-CM | POA: Diagnosis not present

## 2020-08-05 LAB — CBC
HCT: 35.6 % — ABNORMAL LOW (ref 39.0–52.0)
Hemoglobin: 12.4 g/dL — ABNORMAL LOW (ref 13.0–17.0)
MCH: 30.4 pg (ref 26.0–34.0)
MCHC: 34.8 g/dL (ref 30.0–36.0)
MCV: 87.3 fL (ref 80.0–100.0)
Platelets: 286 10*3/uL (ref 150–400)
RBC: 4.08 MIL/uL — ABNORMAL LOW (ref 4.22–5.81)
RDW: 14.4 % (ref 11.5–15.5)
WBC: 19.5 10*3/uL — ABNORMAL HIGH (ref 4.0–10.5)
nRBC: 0 % (ref 0.0–0.2)

## 2020-08-05 LAB — COMPREHENSIVE METABOLIC PANEL
ALT: 17 U/L (ref 0–44)
AST: 14 U/L — ABNORMAL LOW (ref 15–41)
Albumin: 2.6 g/dL — ABNORMAL LOW (ref 3.5–5.0)
Alkaline Phosphatase: 73 U/L (ref 38–126)
Anion gap: 9 (ref 5–15)
BUN: 17 mg/dL (ref 6–20)
CO2: 25 mmol/L (ref 22–32)
Calcium: 8.6 mg/dL — ABNORMAL LOW (ref 8.9–10.3)
Chloride: 100 mmol/L (ref 98–111)
Creatinine, Ser: 0.98 mg/dL (ref 0.61–1.24)
GFR, Estimated: >60 mL/min (ref >60)
Glucose, Bld: 127 mg/dL — ABNORMAL HIGH (ref 70–99)
Potassium: 4.1 mmol/L (ref 3.5–5.1)
Sodium: 134 mmol/L — ABNORMAL LOW (ref 135–145)
Total Bilirubin: 0.8 mg/dL (ref 0.3–1.2)
Total Protein: 5.9 g/dL — ABNORMAL LOW (ref 6.5–8.1)

## 2020-08-05 MED ORDER — NICOTINE 7 MG/24HR TD PT24
7.0000 mg | MEDICATED_PATCH | Freq: Every day | TRANSDERMAL | Status: DC
  Administered 2020-08-05 – 2020-08-06 (×2): 7 mg via TRANSDERMAL
  Filled 2020-08-05 (×3): qty 1

## 2020-08-05 MED ORDER — SODIUM CHLORIDE 0.9 % IV SOLN
6.0000 mg/kg | Freq: Every day | INTRAVENOUS | Status: DC
  Administered 2020-08-05 – 2020-08-06 (×2): 400 mg via INTRAVENOUS
  Filled 2020-08-05 (×3): qty 8

## 2020-08-05 NOTE — Progress Notes (Signed)
ENT Progress Note  Subjective: Patient reports improvement in facial swelling, endorses persistent pain. No paresthesia or facial numbness, tolerating clear liquid diet  Vitals:   08/04/20 2355 08/05/20 0457  BP: 132/82 121/86  Pulse: 85 75  Resp: 17 17  Temp: 98.8 F (37.1 C) 98.6 F (37 C)  SpO2: 98% 98%     OBJECTIVE  Gen: alert, cooperative, appropriate Head/ENT: EOMI, facial edema much improved, persistent induration over nasal dorsum, no palpable fluctuance. Neck supple, mucus membranes moist and pink, intraoral penrose drain patent, scant drainage expressed with massage, no frank purulence noted.  Face moves symmetrically Respiratory: Voice without dysphonia. non-labored breathing, no accessory muscle use, normal HR, good O2 saturations  ASSESS/ PLAN  Benjamin Solomon is a 55 y.o. male who is POD 1 from nasal endoscopy, intraoral incision and drainage of complex odontogenic facial abscess with dental extraction by oral surgery  - Continue IV antibiotics as per ID, operative cultures pending - Recommend application of warm compresses to the face every 4 hours - IV Decadron '8mg'$  Q8H for 3 doses, completed this AM - Peridex swish and spit - Will plan on drain removal prior to discharge, pending continued clinical improvement  - Medical management as per primary team  Thank you for allowing me to participate in the care of this patient. Please do not hesitate to contact me with any questions or concerns.   Jason Coop, [facility] Cell: [phone]

## 2020-08-05 NOTE — Progress Notes (Addendum)
   Subjective: Overnight patient had an I&D and tooth removal.  States he feels better this morning.  Feels that the swelling is somewhat improved and pain has been controlled as well.  No difficulty breathing or shortness of breath.  Objective:  Vital signs in last 24 hours: Vitals:   08/04/20 2000 08/04/20 2018 08/04/20 2355 08/05/20 0457  BP: (!) 145/88 (!) 148/91 132/82 121/86  Pulse: 88 89 85 75  Resp: '12 16 17 17  '$ Temp:  98.2 F (36.8 C) 98.8 F (37.1 C) 98.6 F (37 C)  TempSrc:  Oral Oral Oral  SpO2: 98% 98% 98% 98%  Weight:      Height:       Physical Exam General: alert, appears stated age, in no acute distress HEENT: Normocephalic, atraumatic, EOM intact, conjunctiva normal, decreased facial swelling compared to yesterday, uvula midline CV: Regular rate and rhythm, no murmurs rubs or gallops Pulm: Clear to auscultation bilaterally, normal work of breathing Abdomen: Soft, nondistended, bowel sounds present, no tenderness to palpation MSK: No lower extremity edema Skin: Warm and dry Neuro: Alert and oriented x3   Assessment/Plan:  Active Problems:   Abscess  Mr. Ricke Stclair is a 55 year old male who presented with worsening facial swelling found to have a 2.9 cm facial abscess.  Facial abscess CT head demonstrated 2.1 x 1.6 x 2.9 cm abscess on the left side of the nose, thought to be from odontogenic source.  Status post I&D and tooth extraction on 7/22.  Overall improved today, continues to protect his airway.  Leukocytosis stable, 19.5.  Currently on daptomycin and Unasyn as well as steroids. -ENT, oral surgery and dental on board, appreciate recommendations -Appreciate infectious disease recommendation -Continue daptomycin and Unasyn -Continue Decadron 8 mg every 8 hours for 3 doses -Trend CBC -Follow cultures -ENT plans on drain removal prior to discharge  Hypertension Home medications include amlodipine 5 mg and Zestoretic 20-12.5 mg daily.  Pressures  normotensive this morning, will continue to hold.  Chronic back pain Continue home medications. - Oxycodone acetaminophen 10-325 mg every 4 hours as needed for pain - MS Contin 15 mg every 12 hours  Depression Anxiety - Continue Prozac 20 mg daily - Continue Wellbutrin 3 mg nightly - Continue Abilify 5 mg nightly   Prior to Admission Living Arrangement: Home Anticipated Discharge Location: Home Barriers to Discharge: Clinical improvement  Dispo: Anticipated discharge pending clinical improvement  Ladeana Laplant, Charlsie Quest, DO 08/05/2020, 9:59 AM Pager: [phone] After 5pm on weekdays and 1pm on weekends: On Call pager [phone]

## 2020-08-06 LAB — CBC
HCT: 36.3 % — ABNORMAL LOW (ref 39.0–52.0)
Hemoglobin: 12.3 g/dL — ABNORMAL LOW (ref 13.0–17.0)
MCH: 29.9 pg (ref 26.0–34.0)
MCHC: 33.9 g/dL (ref 30.0–36.0)
MCV: 88.3 fL (ref 80.0–100.0)
Platelets: 318 10*3/uL (ref 150–400)
RBC: 4.11 MIL/uL — ABNORMAL LOW (ref 4.22–5.81)
RDW: 14.4 % (ref 11.5–15.5)
WBC: 20.9 10*3/uL — ABNORMAL HIGH (ref 4.0–10.5)
nRBC: 0 % (ref 0.0–0.2)

## 2020-08-06 LAB — COMPREHENSIVE METABOLIC PANEL
ALT: 68 U/L — ABNORMAL HIGH (ref 0–44)
AST: 69 U/L — ABNORMAL HIGH (ref 15–41)
Albumin: 2.6 g/dL — ABNORMAL LOW (ref 3.5–5.0)
Alkaline Phosphatase: 76 U/L (ref 38–126)
Anion gap: 10 (ref 5–15)
BUN: 21 mg/dL — ABNORMAL HIGH (ref 6–20)
CO2: 24 mmol/L (ref 22–32)
Calcium: 8.7 mg/dL — ABNORMAL LOW (ref 8.9–10.3)
Chloride: 102 mmol/L (ref 98–111)
Creatinine, Ser: 1.01 mg/dL (ref 0.61–1.24)
GFR, Estimated: >60 mL/min (ref >60)
Glucose, Bld: 247 mg/dL — ABNORMAL HIGH (ref 70–99)
Potassium: 3.8 mmol/L (ref 3.5–5.1)
Sodium: 136 mmol/L (ref 135–145)
Total Bilirubin: 0.7 mg/dL (ref 0.3–1.2)
Total Protein: 5.9 g/dL — ABNORMAL LOW (ref 6.5–8.1)

## 2020-08-06 LAB — GLUCOSE, CAPILLARY
Glucose-Capillary: 104 mg/dL — ABNORMAL HIGH (ref 70–99)
Glucose-Capillary: 106 mg/dL — ABNORMAL HIGH (ref 70–99)
Glucose-Capillary: 93 mg/dL (ref 70–99)

## 2020-08-06 MED ORDER — INSULIN ASPART 100 UNIT/ML IJ SOLN: 0.0000 [IU] | Freq: Three times a day (TID) | INTRAMUSCULAR | Status: DC

## 2020-08-06 MED ORDER — LISINOPRIL 20 MG PO TABS
20.0000 mg | ORAL_TABLET | Freq: Every day | ORAL | Status: DC
  Administered 2020-08-06 – 2020-08-07 (×2): 20 mg via ORAL
  Filled 2020-08-06 (×2): qty 1

## 2020-08-06 MED ORDER — LISINOPRIL-HYDROCHLOROTHIAZIDE 20-25 MG PO TABS: 1.0000 | ORAL_TABLET | Freq: Every day | ORAL | Status: DC

## 2020-08-06 MED ORDER — HYDROCHLOROTHIAZIDE 25 MG PO TABS
25.0000 mg | ORAL_TABLET | Freq: Every day | ORAL | Status: DC
  Administered 2020-08-06 – 2020-08-07 (×2): 25 mg via ORAL
  Filled 2020-08-06 (×2): qty 1

## 2020-08-06 NOTE — Progress Notes (Addendum)
   Subjective: The patient was seen at bedside with his wife. He reports that the swelling has improved since his procedure, and he endorses no specific complaints or concerns today.   Objective:  Vital signs in last 24 hours: Vitals:   08/05/20 1218 08/05/20 1841 08/05/20 2316 08/06/20 0523  BP: 119/76 132/76 (!) 146/83 (!) 154/92  Pulse: 95 91 85 90  Resp: '16 16 16 18  '$ Temp: 98.6 F (37 C) 98.3 F (36.8 C) 98.5 F (36.9 C) 98.2 F (36.8 C)  TempSrc: Oral Oral Oral Oral  SpO2: 95% 94% 99% 100%  Weight:      Height:       Physical Exam General: alert, appears stated age, in no acute distress HEENT: Normocephalic, atraumatic, EOM intact, conjunctiva normal, decreased facial swelling compared to yesterday, uvula midline CV: Regular rate and rhythm, no murmurs rubs or gallops Pulm: Clear to auscultation bilaterally, normal work of breathing Abdomen: Soft, nondistended, bowel sounds present, no tenderness to palpation MSK: No lower extremity edema Skin: Warm and dry Neuro: Alert and oriented x3   Assessment/Plan:  Active Problems:   Abscess  Benjamin Solomon is a 55 year old male who presented with worsening facial swelling found to have a 2.9 cm facial abscess, who is improving s/p #11 tooth extraction and I&D on 08/04/2020.  Facial abscess CT head demonstrated 2.1 x 1.6 x 2.9 cm abscess on the left side of the nose, thought to be from odontogenic source. Status post I&D and tooth extraction on 7/22. Overall improved today, continues to protect his airway. Leukocytosis stable, 20.9. Currently on daptomycin and Unasyn. Operative cultures are pending. -ENT, oral surgery and dental on board, appreciate recommendations -Appreciate infectious disease recommendations -Continue daptomycin and Unasyn -Trend CBC -Follow cultures -ENT plans on drain removal prior to discharge  Hypertension Home medications include amlodipine 5 mg and Zestoretic 20-12.5 mg daily. Pressures are  increasing, most recent BP of 154/92 today. Will resume home Zestoretic today. If BP continues to be elevated, can add on amlodipine. -Zestoretic 20-12.5 mg daily  Chronic back pain Continue home medications. - Oxycodone acetaminophen 10-325 mg every 4 hours as needed for pain - MS Contin 15 mg every 12 hours  Depression Anxiety - Continue Prozac 20 mg daily - Continue Wellbutrin 300 mg nightly - Continue Abilify 5 mg nightly  Prior to Admission Living Arrangement: Home Anticipated Discharge Location: Home Barriers to Discharge: Clinical improvement  Dispo: Anticipated discharge pending clinical improvement  Benjamin Brill, MD 08/06/2020, 9:54 AM Pager: [phone] After 5pm on weekdays and 1pm on weekends: On Call pager [phone]

## 2020-08-07 DIAGNOSIS — L0201 Cutaneous abscess of face: Secondary | ICD-10-CM | POA: Diagnosis not present

## 2020-08-07 LAB — GLUCOSE, CAPILLARY
Glucose-Capillary: 108 mg/dL — ABNORMAL HIGH (ref 70–99)
Glucose-Capillary: 86 mg/dL (ref 70–99)

## 2020-08-07 LAB — ANAEROBIC CULTURE W GRAM STAIN

## 2020-08-07 LAB — AEROBIC CULTURE W GRAM STAIN (SUPERFICIAL SPECIMEN): Culture: NORMAL

## 2020-08-07 LAB — CBC
HCT: 36.9 % — ABNORMAL LOW (ref 39.0–52.0)
Hemoglobin: 12.6 g/dL — ABNORMAL LOW (ref 13.0–17.0)
MCH: 29.9 pg (ref 26.0–34.0)
MCHC: 34.1 g/dL (ref 30.0–36.0)
MCV: 87.6 fL (ref 80.0–100.0)
Platelets: 346 10*3/uL (ref 150–400)
RBC: 4.21 MIL/uL — ABNORMAL LOW (ref 4.22–5.81)
RDW: 14.3 % (ref 11.5–15.5)
WBC: 14.5 10*3/uL — ABNORMAL HIGH (ref 4.0–10.5)
nRBC: 0 % (ref 0.0–0.2)

## 2020-08-07 LAB — BASIC METABOLIC PANEL
Anion gap: 10 (ref 5–15)
BUN: 19 mg/dL (ref 6–20)
CO2: 27 mmol/L (ref 22–32)
Calcium: 8.4 mg/dL — ABNORMAL LOW (ref 8.9–10.3)
Chloride: 99 mmol/L (ref 98–111)
Creatinine, Ser: 1.03 mg/dL (ref 0.61–1.24)
GFR, Estimated: >60 mL/min (ref >60)
Glucose, Bld: 109 mg/dL — ABNORMAL HIGH (ref 70–99)
Potassium: 3.5 mmol/L (ref 3.5–5.1)
Sodium: 136 mmol/L (ref 135–145)

## 2020-08-07 MED ORDER — CHLORHEXIDINE GLUCONATE 0.12 % MT SOLN: 15.0000 mL | Freq: Three times a day (TID) | OROMUCOSAL | 0 refills | Status: AC

## 2020-08-07 MED ORDER — AMOXICILLIN-POT CLAVULANATE 875-125 MG PO TABS: 1.0000 | ORAL_TABLET | Freq: Two times a day (BID) | ORAL | 0 refills | Status: DC

## 2020-08-07 MED ORDER — AMLODIPINE BESYLATE 5 MG PO TABS
5.0000 mg | ORAL_TABLET | Freq: Every day | ORAL | Status: DC
  Administered 2020-08-07: 5 mg via ORAL

## 2020-08-07 NOTE — Progress Notes (Signed)
   Subjective:  Benjamin Solomon is a 55 y/o male with HTN and MDD admitted with a facial abscess s/p dental extraction and I&D.   Benjamin Solomon notes that his facial swelling is much improved and that he is continuing to feel much better. He continues to have some pain but notes that it is well controlled with his outpatient chronic back pain narcotic regimen. He denies any headaches or subjective fevers.   He notes that his blood pressure has been running a little high and would like to add his home amlodipine regimen back on.  Objective:  Vital signs in last 24 hours: Vitals:   08/06/20 1100 08/06/20 1725 08/06/20 2218 08/07/20 0524  BP: (!) 138/96 (!) 146/79 136/86 (!) 147/85  Pulse: 80 97 77 80  Resp: '16 17 19 17  '$ Temp: 98.2 F (36.8 C) 98.3 F (36.8 C) 98 F (36.7 C) 98.4 F (36.9 C)  TempSrc: Oral Oral Oral Oral  SpO2: 98% 98% 99% 99%  Weight:      Height:       Weight change:   Intake/Output Summary (Last 24 hours) at 08/07/2020 [id number] Last data filed at 08/07/2020 0525 Gross per 24 hour  Intake 480 ml  Output --  Net 480 ml   General: Pleasant, well-appearing gentleman laying comfortably in bed. No acute distress. Head: Normocephalic. Atraumatic. No facial swelling appreciated. Oropharynx without hyperemia. Extraction site with minimal swelling. CV: RRR. No murmurs, rubs, or gallops. No LE edema Pulmonary: Lungs CTAB. Normal effort. No wheezing or rales. Abdominal: Soft, nontender, nondistended. Normal bowel sounds. Extremities: Palpable radial and DP pulses. Normal ROM. Skin: Warm and dry. No obvious rash or lesions. Neuro: A&Ox3. Moves all extremities. Normal sensation. No focal deficit. Psych: Normal mood and affect    Assessment/Plan:  Active Problems:   Abscess  Facial abscess S/p dental extraction and I&D. Continuing to heal well. WBC improved from 20.9k yesterday to 14.5k today. Remains afebrile. Final blood culture results are pending, which will  inform outpatient abx regimen.  -Continue '400mg'$  Daptomycin daily -Continue 3g Ampicillin-Sulbactam q 6h -Trending CBC and Cultures -Will reach back out to ENT when cultures finalize, to remove drain prior to discharge  Essential HTN Not well controlled this AM at 147/85, on only one of his two outpatient HTN medications.  -Continue '20mg'$  Lisinopril and '25mg'$  HCTZ combination -Resume '5mg'$  Amlodipine today  Glucose intolerance Only one glucose reading above 130 this admission, at 247 yesterday. Five subsequent ABGs normal. A1C pending. -Continue SSI   Chronic back pain -Continue outpatient '15mg'$  MS Contin q 12hr prn -Continue outpatient 2x Percocet 5-'325mg'$  q 4 hr prn  Depression and Anxiety -Continue outpatient '20mg'$  Prozac, '300mg'$  Welbutrin, and '5mg'$  Abilify  Prior to admission living arrangement: home Anticipated discharge location: home Barriers to discharge: awaiting final blood culture results to determine outpatient antibiotic regimen. ENT to then remove drain. Dispo: 1-2 days     LOS: 3 days   London Pepper, Medical Student 08/07/2020, 9:52 AM

## 2020-08-07 NOTE — Progress Notes (Signed)
Patient has ordered for discharge. Given discharge instructions with paper to the patient. Iv removed. Given all belongings to the patient. 

## 2020-08-07 NOTE — Discharge Instructions (Signed)
[name], Benjamin Solomon were hospitalized due to a facial abscess secondary to a tooth infection.  He was treated with a couple days of IV antibiotics.  The infectious disease doctors when she did continue taking Augmentin 2 times daily.  Please continue this medication until you follow-up with them.  Continue using Peridex swish and spit solution after every meal for 2 weeks.  Make sure to follow-up with a dentist for dental care.  Also follow-up with your primary care physician in 1 to 2 weeks.  You may resume all your home medications as prescribed.  Thank you for allowing [place] to be part your care!

## 2020-08-07 NOTE — Progress Notes (Signed)
[facility]  Date of Admission:  08/04/2020     cc: facial abscess                             Referring Provider: Heber Gaines     Lines:  Peripheral IV's   Abx: 7/22-c dapto 7/22-c amp-sulbactam   7/22 vanc/piptazo                                                         Recent dexamethasone/clinda--> doxy outpatient   Assessment:  55 y.o. male smoker without diabetes mellitus, recurrent left face swelling/cellulitis admitted for same in setting of imaging face showing an abscess. Patient is s/p I&D 7/22 with odontogenic source tooth #11 removed as well  Clinically doing well with significantly improved swelling OR culture no mrsa but anticipate oral flora Bcx negative Hiv negative   Plan: Stop daptomycin Continue amp-sulbactam while in house On discharge, can dc with amox-clav 875 mg bid -- please provide 1 month supply ID f/u 8/12 @ 230 with Dr Gale Journey arranged Discussed with primary team Id will sign off  Clinic Follow Up Appt @ [facility] [place], [place], [place] [address] Phone: [phone]    I spent more than 35 minute reviewing data/chart, and coordinating care and >50% direct face to face time providing counseling/discussing diagnostics/treatment plan with patient   Active Problems:   Abscess   No Known Allergies  Scheduled Meds:  ARIPiprazole  5 mg Oral QHS   buPROPion  300 mg Oral Daily   enoxaparin (LOVENOX) injection  40 mg Subcutaneous Q24H   FLUoxetine  20 mg Oral Daily   lisinopril  20 mg Oral Daily   And   hydrochlorothiazide  25 mg Oral Daily   insulin aspart  0-15 Units Subcutaneous TID WC   morphine  15 mg Oral Q12H   nicotine  7 mg Transdermal Daily   Continuous Infusions:  ampicillin-sulbactam (UNASYN) IV 3 g (08/07/20 0456)   PRN Meds:.amLODipine, oxyCODONE-acetaminophen   SUBJECTIVE: Doing well, swelling much better Improved leukocytosis No fever No n/v/diarrhea Drain still  in place  Review of Systems: ROS All other ROS was negative, except mentioned above     OBJECTIVE: Vitals:   08/06/20 1100 08/06/20 1725 08/06/20 2218 08/07/20 0524  BP: (!) 138/96 (!) 146/79 136/86 (!) 147/85  Pulse: 80 97 77 80  Resp: '16 17 19 17  '$ Temp: 98.2 F (36.8 C) 98.3 F (36.8 C) 98 F (36.7 C) 98.4 F (36.9 C)  TempSrc: Oral Oral Oral Oral  SpO2: 98% 98% 99% 99%  Weight:      Height:       Body mass index is 23.57 kg/m.  Physical Exam General/constitutional: no distress, pleasant HEENT: facial swelling resolved; intact packing in mouth; no purulence Neck supple CV: rrr no mrg Lungs: clear to auscultation, normal respiratory effort Abd: Soft, Nontender Ext: no edema Skin: No Rash Neuro: nonfocal MSK: no peripheral joint swelling/tenderness/warmth; back spines nontender    Lab Results Lab Results  Component Value Date   WBC 14.5 (H) 08/07/2020   HGB 12.6 (L) 08/07/2020   HCT 36.9 (L) 08/07/2020   MCV 87.6 08/07/2020   PLT 346 08/07/2020  Lab Results  Component Value Date   CREATININE 1.03 08/07/2020   BUN 19 08/07/2020   NA 136 08/07/2020   K 3.5 08/07/2020   CL 99 08/07/2020   CO2 27 08/07/2020    Lab Results  Component Value Date   ALT 68 (H) 08/06/2020   AST 69 (H) 08/06/2020   ALKPHOS 76 08/06/2020   BILITOT 0.7 08/06/2020      Microbiology: Recent Results (from the past 240 hour(s))  Culture, blood (routine x 2)     Status: None (Preliminary result)   Collection Time: 08/04/20  8:42 AM   Specimen: BLOOD  Result Value Ref Range Status   Specimen Description   Final    BLOOD LEFT ANTECUBITAL Performed at [facility], [address], [place], [place] [address]    Special Requests   Final    BOTTLES DRAWN AEROBIC AND ANAEROBIC Blood Culture adequate volume Performed at [facility], [address] [place], [place] [address]    Culture   Final    NO GROWTH 3 DAYS Performed at [facility], [place] [address],  [place], [place] [address]    Report Status PENDING  Incomplete  Culture, blood (routine x 2)     Status: None (Preliminary result)   Collection Time: 08/04/20  8:47 AM   Specimen: BLOOD LEFT HAND  Result Value Ref Range Status   Specimen Description   Final    BLOOD LEFT HAND Performed at [facility], [address] [place] [address], [place], [place] [address]    Special Requests   Final    BOTTLES DRAWN AEROBIC AND ANAEROBIC Blood Culture adequate volume Performed at [facility], [address] [place], [place] [address]    Culture   Final    NO GROWTH 3 DAYS Performed at [facility], [place] [address], [place], [place] [address]    Report Status PENDING  Incomplete  Resp Panel by RT-PCR (Flu A&B, Covid) Nasopharyngeal Swab     Status: None   Collection Time: 08/04/20  8:48 AM   Specimen: Nasopharyngeal Swab; Nasopharyngeal(NP) swabs in vial transport medium  Result Value Ref Range Status   SARS Coronavirus 2 by RT PCR NEGATIVE NEGATIVE Final    Comment: (NOTE) SARS-CoV-2 target nucleic acids are NOT DETECTED.  The SARS-CoV-2 RNA is generally detectable in upper respiratory specimens during the acute phase of infection. The lowest concentration of SARS-CoV-2 viral copies this assay can detect is 138 copies/mL. A negative result does not preclude SARS-Cov-2 infection and should not be used as the sole basis for treatment or other patient management decisions. A negative result may occur with  improper specimen collection/handling, submission of specimen other than nasopharyngeal swab, presence of viral mutation(s) within the areas targeted by this assay, and inadequate number of viral copies(<138 copies/mL). A negative result must be combined with clinical observations, patient history, and epidemiological information. The expected result is Negative.  Fact Sheet for Patients:  EntrepreneurPulse.com.au  Fact Sheet for Healthcare Providers:   IncredibleEmployment.be  This test is no t yet approved or cleared by the [place] FDA and  has been authorized for detection and/or diagnosis of SARS-CoV-2 by FDA under an Emergency Use Authorization (EUA). This EUA will remain  in effect (meaning this test can be used) for the duration of the COVID-19 declaration under Section 564(b)(1) of the Act, 21 U.S.C.section 360bbb-3(b)(1), unless the authorization is terminated  or revoked sooner.       Influenza A by PCR NEGATIVE NEGATIVE  Final   Influenza B by PCR NEGATIVE NEGATIVE Final    Comment: (NOTE) The Xpert Xpress SARS-CoV-2/FLU/RSV plus assay is intended as an aid in the diagnosis of influenza from Nasopharyngeal swab specimens and should not be used as a sole basis for treatment. Nasal washings and aspirates are unacceptable for Xpert Xpress SARS-CoV-2/FLU/RSV testing.  Fact Sheet for Patients: EntrepreneurPulse.com.au  Fact Sheet for Healthcare Providers: IncredibleEmployment.be  This test is not yet approved or cleared by the [place] FDA and has been authorized for detection and/or diagnosis of SARS-CoV-2 by FDA under an Emergency Use Authorization (EUA). This EUA will remain in effect (meaning this test can be used) for the duration of the COVID-19 declaration under Section 564(b)(1) of the Act, 21 U.S.C. section 360bbb-3(b)(1), unless the authorization is terminated or revoked.  Performed at [facility], [place] [address], [place], [place] [address]   Aerobic Culture w Gram Stain (superficial specimen)     Status: None (Preliminary result)   Collection Time: 08/04/20  6:36 PM   Specimen: Abscess  Result Value Ref Range Status   Specimen Description ABSCESS FACE  Final   Special Requests PT ON VANC UNASYN  Final   Gram Stain   Final    ABUNDANT WBC PRESENT, PREDOMINANTLY PMN FEW GRAM POSITIVE COCCI IN CHAINS FEW GRAM VARIABLE ROD    Culture    Final    MODERATE NORMAL SKIN FLORA NO GROUP A STREP (S.PYOGENES) ISOLATED NO STAPHYLOCOCCUS AUREUS ISOLATED Performed at [facility], [place] [address] [place], [place] [address]    Report Status PENDING  Incomplete  Anaerobic culture w Gram Stain     Status: None (Preliminary result)   Collection Time: 08/04/20  6:36 PM   Specimen: Abscess  Result Value Ref Range Status   Specimen Description ABSCESS FACE  Final   Special Requests PT ON VANC UNASYN  Final   Gram Stain   Final    ABUNDANT WBC PRESENT, PREDOMINANTLY PMN FEW GRAM POSITIVE COCCI IN CHAINS FEW GRAM VARIABLE ROD    Culture   Final    HOLDING FOR POSSIBLE ANAEROBE Performed at [facility], [place] [address], [place], [place] [address]    Report Status PENDING  Incomplete     Serology:   Imaging: If present, new imagings (plain films, ct scans, and mri) have been personally visualized and interpreted; radiology reports have been reviewed. Decision making incorporated into the Impression / Recommendations.   Jabier Mutton, [place] [facility] [phone] pager    08/07/2020, 11:11 AM

## 2020-08-07 NOTE — Progress Notes (Signed)
ENT Progress Note  Subjective: Patient reports improvement in facial swelling and persistent pain. No paresthesia or facial numbness, tolerating diet.  Vitals:   08/07/20 0524 08/07/20 1213  BP: (!) 147/85 132/87  Pulse: 80 92  Resp: 17 16  Temp: 98.4 F (36.9 C) 98.2 F (36.8 C)  SpO2: 99% 96%     OBJECTIVE  Gen: alert, cooperative, appropriate Head/ENT: EOMI, facial edema much improved, persistent induration over nasal dorsum, no palpable fluctuance. Neck supple, mucus membranes moist and pink, intraoral penrose drain patent, scant drainage expressed with massage, no frank purulence noted.  Face moves symmetrically Respiratory: Voice without dysphonia. non-labored breathing, no accessory muscle use, normal HR, good O2 saturations  ASSESS/ PLAN  Benjamin Solomon is a 55 y.o. male who is POD 3 from nasal endoscopy, intraoral incision and drainage of complex odontogenic facial abscess with dental extraction by oral surgery  - Penrose drain removed at bedside, no additional frank purulence expressed. - DC with oral abx as per ID - Peridex swish and spit after all meals - Medical management as per primary team - FU with DMD for routine dental care. FU with ENT as needed.   Thank you for allowing me to participate in the care of this patient. Please do not hesitate to contact me with any questions or concerns.   Jason Coop, [facility] Cell: [phone]

## 2020-08-07 NOTE — Discharge Summary (Signed)
Name: Benjamin Solomon MRN: [id number] DOB: August 13, 1965 55 y.o. PCP: Medicine, [facility]  Date of Admission: 08/04/2020  1:04 AM Date of Discharge:  08/07/2020 Attending Physician: Sid Falcon, MD  Discharge Diagnosis: Facial abscess Essential HTN Glucose intolerance Chronic back pain Depression and anxiety  Discharge Medications: Allergies as of 08/07/2020   No Known Allergies      Medication List     STOP taking these medications    cyclobenzaprine 5 MG tablet Commonly known as: FLEXERIL   doxycycline 100 MG capsule Commonly known as: VIBRAMYCIN   lidocaine 2 % solution Commonly known as: XYLOCAINE   nystatin 100000 UNIT/ML suspension Commonly known as: MYCOSTATIN   pseudoephedrine 30 MG tablet Commonly known as: Sudafed       TAKE these medications    ALPRAZolam 0.5 MG tablet Commonly known as: XANAX Take 0.5 mg by mouth at bedtime as needed for sleep.   amLODipine 5 MG tablet Commonly known as: NORVASC Take 5 mg by mouth daily as needed (for high blood pressure).   amoxicillin-clavulanate 875-125 MG tablet Commonly known as: Augmentin Take 1 tablet by mouth 2 (two) times daily.   ARIPiprazole 5 MG tablet Commonly known as: ABILIFY Take 5 mg by mouth at bedtime.   buPROPion 300 MG 24 hr tablet Commonly known as: WELLBUTRIN XL Take 300 mg by mouth daily.   chlorhexidine 0.12 % solution Commonly known as: Peridex Use as directed 15 mLs in the mouth or throat with breakfast, with lunch, and with evening meal for 14 days.   clindamycin 1 % external solution Commonly known as: CLEOCIN T Apply 1 application topically 2 (two) times daily as needed (boils).   clindamycin 300 MG capsule Commonly known as: CLEOCIN Take 300 mg by mouth 2 (two) times daily as needed (boils).   docusate sodium 100 MG capsule Commonly known as: COLACE Take 200 mg by mouth daily.   FLUoxetine 20 MG capsule Commonly known as:  PROZAC Take 20 mg by mouth daily.   lisinopril-hydrochlorothiazide 20-25 MG tablet Commonly known as: ZESTORETIC Take 1 tablet by mouth daily.   morphine 15 MG 12 hr tablet Commonly known as: MS CONTIN Take 15 mg by mouth every 12 (twelve) hours.   oxyCODONE-acetaminophen 10-325 MG tablet Commonly known as: PERCOCET Take 1 tablet by mouth every 4 (four) hours as needed for pain.   tamsulosin 0.4 MG Caps capsule Commonly known as: FLOMAX Take 0.4 mg by mouth daily as needed (urination).        Disposition and follow-up:   [name] was discharged from [facility] in Good condition.  At the hospital follow up visit please address:  1.  Pt to f/u with Infectious Disease on 8/12 w/ Dr Gale Journey. Also evaluate pending A1C.   2.  Labs / imaging needed at time of follow-up: none  3.  Pending labs/ test needing follow-up:   08/04/20 Anaerobic and Aerobic cultures.  08/06/20 A1C.  Follow-up Appointments: Infectious Disease w/ Dr Gale Journey on 08/25/20 at [place] by problem list: Pt presented w recurrent left facial swelling admitting for cellulitis with CT imaging revealing 9.75cm^3 abscess. Pt underwent dental extraction and I&D with ENT and Oral Maxillofacial surgery. Received IV Daptomycin and Ampicillin-Sulbactam from 7/22-7/25. Responded very well.  Facial abscess S/p dental extraction and I&D. Continuing to heal well. WBC improved from 20.9k yesterday to 14.5k today. Remains afebrile. Final blood culture results are pending. ENT placed and removed drain.  ID recd discharge abx. -Peridex mouthwash after meals for two weeks -D/c w/ Amoxicillin-Clavulonate '875mg'$  BID, to continue until he follows up with ID -F/u w/ ID on 8/12 at 2:30p w/ Dr Gale Journey  Essential HTN -Continue '20mg'$  Lisinopril and '25mg'$  HCTZ combination -Resume '5mg'$  Amlodipine today   Glucose intolerance Only one glucose reading above 130 this admission, at 247 yesterday. Five subsequent ABGs  normal. A1C pending.   Chronic back pain -Continue outpatient '15mg'$  MS Contin q 12hr prn -Continue outpatient 2x Percocet 5-'325mg'$  q 4 hr prn   Depression and Anxiety -Continue outpatient '20mg'$  Prozac, '300mg'$  Welbutrin, and '5mg'$  Abilify  Discharge Exam:   BP 132/87 (BP Location: Left Arm)   Pulse 92   Temp 98.2 F (36.8 C) (Oral)   Resp 16   Ht '5\' 8"'$  (1.727 m)   Wt 70.3 kg   SpO2 96%   BMI 23.57 kg/m  Discharge exam:  General: Pleasant, well-appearing gentleman laying comfortably in bed. No acute distress. Head: Normocephalic. Atraumatic. No facial swelling appreciated. Oropharynx without hyperemia. Extraction site with minimal swelling. CV: RRR. No murmurs, rubs, or gallops. No LE edema Pulmonary: Lungs CTAB. Normal effort. No wheezing or rales. Abdominal: Soft, nontender, nondistended. Normal bowel sounds. Extremities: Palpable radial and DP pulses. Normal ROM. Skin: Warm and dry. No obvious rash or lesions. Neuro: A&Ox3. Moves all extremities. Normal sensation. No focal deficit. Psych: Normal mood and affect  Pertinent Labs, Studies, and Procedures:   CBC Latest Ref Rng & Units 08/07/2020 08/06/2020 08/05/2020  WBC 4.0 - 10.5 K/uL 14.5(H) 20.9(H) 19.5(H)  Hemoglobin 13.0 - 17.0 g/dL 12.6(L) 12.3(L) 12.4(L)  Hematocrit 39.0 - 52.0 % 36.9(L) 36.3(L) 35.6(L)  Platelets 150 - 400 K/uL 346 318 286    BMP Latest Ref Rng & Units 08/07/2020 08/06/2020 08/05/2020  Glucose 70 - 99 mg/dL 109(H) 247(H) 127(H)  BUN 6 - 20 mg/dL 19 21(H) 17  Creatinine 0.61 - 1.24 mg/dL 1.03 1.01 0.98  Sodium 135 - 145 mmol/L 136 136 134(L)  Potassium 3.5 - 5.1 mmol/L 3.5 3.8 4.1  Chloride 98 - 111 mmol/L 99 102 100  CO2 22 - 32 mmol/L '27 24 25  '$ Calcium 8.9 - 10.3 mg/dL 8.4(L) 8.7(L) 8.6(L)     DG Orthopantogram  Result Date: 08/04/2020 CLINICAL DATA:  Facial swelling for 1 week. Pain in mouth. Abscess. EXAM: ORTHOPANTOGRAM/PANORAMIC COMPARISON:  CT maxillofacial 03/05/2020 FINDINGS: Dental work again  noted. No significant periapical lucencies to suggest abscess. No acute or focal osseous abnormality is present. Mandible is intact and located. IMPRESSION: Dental work without evidence for abscess. No acute or focal etiology of the patient's swelling. Electronically Signed   By: San Morelle M.D.   On: 08/04/2020 14:50     Discharge Instructions: Discharge Instructions     Call MD for:  difficulty breathing, headache or visual disturbances   Complete by: As directed    Call MD for:  redness, tenderness, or signs of infection (pain, swelling, redness, odor or green/yellow discharge around incision site)   Complete by: As directed    Call MD for:  temperature >100.4   Complete by: As directed    No wound care   Complete by: As directed      Take Amoxicillin-Clavulonate twice each day until you follow up with Infectious disease. You will receive further instructions when you visit your Infectious Disease specialist, Dr. Gale Journey on 08/25/20 at 2:30p.  Signed: Mike Craze, DO 08/07/2020, 1:00 PM

## 2020-08-08 LAB — HEMOGLOBIN A1C
Hgb A1c MFr Bld: 6 % — ABNORMAL HIGH (ref 4.8–5.6)
Mean Plasma Glucose: 126 mg/dL

## 2020-08-09 ENCOUNTER — Other Ambulatory Visit: Payer: Self-pay

## 2020-08-09 ENCOUNTER — Telehealth: Payer: Self-pay

## 2020-08-09 ENCOUNTER — Encounter: Payer: Self-pay | Admitting: Internal Medicine

## 2020-08-09 ENCOUNTER — Ambulatory Visit (INDEPENDENT_AMBULATORY_CARE_PROVIDER_SITE_OTHER): Payer: Medicare HMO | Admitting: Internal Medicine

## 2020-08-09 VITALS — BP 132/91 | HR 97 | Temp 98.0°F | Wt 148.0 lb

## 2020-08-09 DIAGNOSIS — L0201 Cutaneous abscess of face: Secondary | ICD-10-CM

## 2020-08-09 LAB — CULTURE, BLOOD (ROUTINE X 2)
Culture: NO GROWTH
Culture: NO GROWTH
Special Requests: ADEQUATE
Special Requests: ADEQUATE

## 2020-08-09 MED ORDER — LEVOFLOXACIN 750 MG PO TABS: 750.0000 mg | ORAL_TABLET | Freq: Every day | ORAL | 1 refills | Status: DC

## 2020-08-09 MED ORDER — METRONIDAZOLE 500 MG PO TABS: 500.0000 mg | ORAL_TABLET | Freq: Two times a day (BID) | ORAL | 1 refills | Status: DC

## 2020-08-09 NOTE — Telephone Encounter (Signed)
Patient called complaining of facial swelling and pain today. Patient states his is still currently taking the amoxicillin as well. Patient schedule with you today at 1:45.  Benjamin Solomon

## 2020-08-09 NOTE — Patient Instructions (Signed)
Let's switch your antibiotics  levafloxacin and flagyl; stop amoxicillin-clavulonate   I am ordering a repeat ct face for you   Please call Dr Ebbie Latus (ent who did surgery for you) and asked to be evaluated this or next week.

## 2020-08-09 NOTE — Progress Notes (Signed)
[facility] for Infectious Disease  Patient Active Problem List   Diagnosis Date Noted   Facial abscess 08/04/2020   Sepsis ([place])    Essential (primary) hypertension    Low back pain    Major depressive disorder, single episode, unspecified    Benign prostatic hyperplasia with lower urinary tract symptoms    Carpal tunnel syndrome, right upper limb    Unspecified osteoarthritis, unspecified site    S/P lumbar spinal fusion 08/07/2016   Generalized anxiety disorder 10/04/2012   Alcohol dependence ([place]) 06/26/2012   Cocaine dependence ([place]) 06/26/2012   Depressive disorder, not elsewhere classified 06/26/2012   Bleeding external hemorrhoids 12/17/2011      Subjective:    Patient ID: Benjamin Solomon, male    DOB: January 23, 1965, 55 y.o.   MRN: [id number]  Chief Complaint  Patient presents with   Hospitalization Follow-up    Facial abscess - still having a lot of pain in swelling.     HPI:  Benjamin Solomon is a 55 y.o. male here for facial abscess f/u  Patient had I&D 7/22; cx gpc, gram variable rod, and prevotella. Bcx negative. Discharged on amox-clav  He said the last 2 days facial swelling worse and increased pain  Eating well No f/c No headache  No information for ent f/u   No Known Allergies    Outpatient Medications Prior to Visit  Medication Sig Dispense Refill   ALPRAZolam (XANAX) 0.5 MG tablet Take 0.5 mg by mouth at bedtime as needed for sleep.   4   amLODipine (NORVASC) 5 MG tablet Take 5 mg by mouth daily as needed (for high blood pressure).      ARIPiprazole (ABILIFY) 5 MG tablet Take 5 mg by mouth at bedtime.     buPROPion (WELLBUTRIN XL) 300 MG 24 hr tablet Take 300 mg by mouth daily.  1   chlorhexidine (PERIDEX) 0.12 % solution Use as directed 15 mLs in the mouth or throat with breakfast, with lunch, and with evening meal for 14 days. 630 mL 0   clindamycin (CLEOCIN T) 1 % external solution Apply 1 application topically 2 (two)  times daily as needed (boils).      clindamycin (CLEOCIN) 300 MG capsule Take 300 mg by mouth 2 (two) times daily as needed (boils).      docusate sodium (COLACE) 100 MG capsule Take 200 mg by mouth daily.     FLUoxetine (PROZAC) 20 MG capsule Take 20 mg by mouth daily.     lisinopril-hydrochlorothiazide (ZESTORETIC) 20-25 MG tablet Take 1 tablet by mouth daily.     morphine (MS CONTIN) 15 MG 12 hr tablet Take 15 mg by mouth every 12 (twelve) hours.     oxyCODONE-acetaminophen (PERCOCET) 10-325 MG tablet Take 1 tablet by mouth every 4 (four) hours as needed for pain. 30 tablet 0   tamsulosin (FLOMAX) 0.4 MG CAPS capsule Take 0.4 mg by mouth daily as needed (urination).     amoxicillin-clavulanate (AUGMENTIN) 875-125 MG tablet Take 1 tablet by mouth 2 (two) times daily. 60 tablet 0   No facility-administered medications prior to visit.     Social History   Socioeconomic History   Marital status: Married    Spouse name: Not on file   Number of children: Not on file   Years of education: Not on file   Highest education level: Not on file  Occupational History   Not on file  Tobacco Use   Smoking  status: Every Day    Packs/day: 0.25    Years: 20.00    Pack years: 5.00    Types: Cigarettes   Smokeless tobacco: Never  Vaping Use   Vaping Use: Never used  Substance and Sexual Activity   Alcohol use: No    Alcohol/week: 0.0 standard drinks   Drug use: No   Sexual activity: Yes  Other Topics Concern   Not on file  Social History Narrative   09/22/2012 Benjamin Solomon was born and grew up in [place], [place]. He has 2 brothers and 4 sisters. He is the oldest. He reports his childhood was "pretty good." He was raised by his maternal grandmother. He achieved a GED. He has been married for 22 years. He has one son and one daughter. He lives with his wife. 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 His social support system consists of his wife and his father. 09/22/2012  Benjamin   Social Determinants of Health   Financial Resource Strain: Not on file  Food Insecurity: Not on file  Transportation Needs: Not on file  Physical Activity: Not on file  Stress: Not on file  Social Connections: Not on file  Intimate Partner Violence: Not on file      Review of Systems     Objective:    BP (!) 132/91   Pulse 97   Temp 98 F (36.7 C) (Oral)   Wt 148 lb (67.1 kg)   SpO2 99%   BMI 22.50 kg/m  Nursing note and vital signs reviewed.  Physical Exam General/constitutional: no distress, pleasant HEENT: oropharynx clear; previous site of tooth 11 extraction no purulence. Face nasal bridge more swollen Neck supple CV: rrr no mrg Lungs: clear to auscultation, normal respiratory effort Abd: Soft, Nontender Ext: no edema Skin: No Rash Neuro: nonfocal MSK: no peripheral joint swelling/tenderness/warmth; back spines nontender        Labs:  Micro:  Serology:  Imaging:  Assessment & Plan:   Problem List Items Addressed This Visit       Other   Facial abscess - Primary   Relevant Orders   CT MAXILLOFACIAL W & WO CONTRAST   CBC w/Diff   Comprehensive metabolic panel   C-reactive protein      Meds ordered this encounter  Medications   levofloxacin (LEVAQUIN) 750 MG tablet    Sig: Take 1 tablet (750 mg total) by mouth daily.    Dispense:  30 tablet    Refill:  1   metroNIDAZOLE (FLAGYL) 500 MG tablet    Sig: Take 1 tablet (500 mg total) by mouth 2 (two) times daily.    Dispense:  60 tablet    Refill:  1     cc: facial abscess                             Referring Provider: Heber Bastrop     Lines:  Peripheral IV's   Abx: 7/25-c amox-clav  7/22-25 dapto 7/22-25 amp-sulbactam  7/22 vanc/piptazo                                                         Recent dexamethasone/clinda--> doxy outpatient   Assessment:  55 y.o. male smoker without diabetes mellitus, recurrent left face swelling/cellulitis  admitted for same in setting of  imaging face showing an abscess. Patient is s/p I&D 7/22 with odontogenic source tooth #11 removed as well   Clinically doing well with significantly improved swelling OR culture no mrsa but anticipate oral flora Bcx negative Hiv negative    7/27 assessment Operative cx gpc, gram variable rod, prevotella Increased swelling ?source control. Amox-clav should work so unlikely abx choice Need ent re-evaluation and repeat imaging Will change to levoflox/flagyl for better tissue penetration in case an abscess is coming back   Plan: Start levo/metro Stop amox-clav Number to dr Fredric Dine office provided; advise him to f/u there within 1 week Cbc, cmp, crp and facial ct with contrast F/u 1-2 weeks with rcid; call sooner if worsening pain/swelling or fever/chill  Follow-up: Return in about 2 weeks (around 08/23/2020) for any provider 1-2 weeks. dr Sadrac Zeoli preferred first.    I have spent a total of 20 minutes of face-to-face and non-face-to-face time, excluding clinical staff time, preparing to see patient, ordering tests and/or medications, and provide counseling the patient   Jabier Mutton, [place] for [place] [phone]  pager   [phone] cell 08/09/2020, 1:59 PM

## 2020-08-10 LAB — COMPREHENSIVE METABOLIC PANEL
AG Ratio: 1.4 (calc) (ref 1.0–2.5)
ALT: 89 U/L — ABNORMAL HIGH (ref 9–46)
AST: 18 U/L (ref 10–35)
Albumin: 3.8 g/dL (ref 3.6–5.1)
Alkaline phosphatase (APISO): 88 U/L (ref 35–144)
BUN: 16 mg/dL (ref 7–25)
CO2: 30 mmol/L (ref 20–32)
Calcium: 9.4 mg/dL (ref 8.6–10.3)
Chloride: 98 mmol/L (ref 98–110)
Creat: 1.11 mg/dL (ref 0.70–1.30)
Globulin: 2.7 g/dL (calc) (ref 1.9–3.7)
Glucose, Bld: 102 mg/dL — ABNORMAL HIGH (ref 65–99)
Potassium: 4.5 mmol/L (ref 3.5–5.3)
Sodium: 137 mmol/L (ref 135–146)
Total Bilirubin: 0.4 mg/dL (ref 0.2–1.2)
Total Protein: 6.5 g/dL (ref 6.1–8.1)

## 2020-08-10 LAB — CBC WITH DIFFERENTIAL/PLATELET
Absolute Monocytes: 1043 cells/uL — ABNORMAL HIGH (ref 200–950)
Basophils Absolute: 15 cells/uL (ref 0–200)
Basophils Relative: 0.1 %
Eosinophils Absolute: 373 cells/uL (ref 15–500)
Eosinophils Relative: 2.5 %
HCT: 42.1 % (ref 38.5–50.0)
Hemoglobin: 14.1 g/dL (ref 13.2–17.1)
Lymphs Abs: 4500 cells/uL — ABNORMAL HIGH (ref 850–3900)
MCH: 29.6 pg (ref 27.0–33.0)
MCHC: 33.5 g/dL (ref 32.0–36.0)
MCV: 88.3 fL (ref 80.0–100.0)
MPV: 8.6 fL (ref 7.5–12.5)
Monocytes Relative: 7 %
Neutro Abs: 8970 cells/uL — ABNORMAL HIGH (ref 1500–7800)
Neutrophils Relative %: 60.2 %
Platelets: 378 10*3/uL (ref 140–400)
RBC: 4.77 10*6/uL (ref 4.20–5.80)
RDW: 13.6 % (ref 11.0–15.0)
Total Lymphocyte: 30.2 %
WBC: 14.9 10*3/uL — ABNORMAL HIGH (ref 3.8–10.8)

## 2020-08-10 LAB — C-REACTIVE PROTEIN: CRP: 20.9 mg/L — ABNORMAL HIGH (ref <8.0)

## 2020-08-11 NOTE — Progress Notes (Signed)
Patient ID: Benjamin Solomon, male   DOB: 24-Nov-1965, 55 y.o.   MRN: [id number] [employer] at [phone] spoke with Tariro who advised patient just had one authorized with Dr Corliss Skains Otolaryngologist in [place]   this cpt code [phone] was denied for duplicate  Called Dr Elta [facility] office at [phone] spoke with Juliann Pulse advised where I was calling from and what I needed she advised patient had done on 08-03-20 at [facility] she is faxing the report to [place] and advised to call them at [phone] and ask that they push this thru on [place] at [phone] spoke with Inez Catalina she will have pushed thru so we can see

## 2020-08-16 ENCOUNTER — Ambulatory Visit (HOSPITAL_COMMUNITY): Payer: Medicare HMO

## 2020-08-24 DIAGNOSIS — M5136 Other intervertebral disc degeneration, lumbar region: Secondary | ICD-10-CM | POA: Diagnosis not present

## 2020-08-24 DIAGNOSIS — M961 Postlaminectomy syndrome, not elsewhere classified: Secondary | ICD-10-CM | POA: Diagnosis not present

## 2020-08-24 DIAGNOSIS — M5412 Radiculopathy, cervical region: Secondary | ICD-10-CM | POA: Diagnosis not present

## 2020-08-24 DIAGNOSIS — G894 Chronic pain syndrome: Secondary | ICD-10-CM | POA: Diagnosis not present

## 2020-08-25 ENCOUNTER — Ambulatory Visit (INDEPENDENT_AMBULATORY_CARE_PROVIDER_SITE_OTHER): Payer: Medicare HMO | Admitting: Internal Medicine

## 2020-08-25 ENCOUNTER — Inpatient Hospital Stay: Payer: Medicare HMO | Admitting: Internal Medicine

## 2020-08-25 ENCOUNTER — Telehealth: Payer: Self-pay | Admitting: Internal Medicine

## 2020-08-25 ENCOUNTER — Other Ambulatory Visit: Payer: Self-pay

## 2020-08-25 ENCOUNTER — Encounter: Payer: Self-pay | Admitting: Internal Medicine

## 2020-08-25 VITALS — BP 99/66 | HR 94 | Temp 98.0°F | Wt 150.0 lb

## 2020-08-25 DIAGNOSIS — L0201 Cutaneous abscess of face: Secondary | ICD-10-CM

## 2020-08-25 MED ORDER — METRONIDAZOLE 500 MG PO TABS: 500.0000 mg | ORAL_TABLET | Freq: Two times a day (BID) | ORAL | 0 refills | Status: AC

## 2020-08-25 MED ORDER — LEVOFLOXACIN 750 MG PO TABS: 750.0000 mg | ORAL_TABLET | Freq: Every day | ORAL | 0 refills | Status: AC

## 2020-08-25 NOTE — Patient Instructions (Signed)
You should be taking 2 antibiotics  Metronidazole 500 mg twice a day Levofloxacin 750 mg once a day   I have placed prescription for 28 more days starting today. You can pick up today    See me in 3-4 weeks   Labs today   I have contacted dr [facility] office, waiting to hear back

## 2020-08-25 NOTE — Progress Notes (Addendum)
[facility] for Infectious Disease  Patient Active Problem List   Diagnosis Date Noted   Facial abscess 08/04/2020   Sepsis ([place])    Essential (primary) hypertension    Low back pain    Major depressive disorder, single episode, unspecified    Benign prostatic hyperplasia with lower urinary tract symptoms    Carpal tunnel syndrome, right upper limb    Unspecified osteoarthritis, unspecified site    S/P lumbar spinal fusion 08/07/2016   Generalized anxiety disorder 10/04/2012   Alcohol dependence ([place]) 06/26/2012   Cocaine dependence ([place]) 06/26/2012   Depressive disorder, not elsewhere classified 06/26/2012   Bleeding external hemorrhoids 12/17/2011      Subjective:    Patient ID: Benjamin Solomon, male    DOB: 01/10/1966, 55 y.o.   MRN: [id number]  Chief Complaint  Patient presents with   Follow-up    Facial abscess - still having swelling although improved. Pt reports he has bee having nose bleeds.     HPI:  Benjamin Solomon is a 55 y.o. male here for facial abscess f/u  Patient had I&D 7/22; cx gpc, gram variable rod, and prevotella. Bcx negative. Discharged on amox-clav  He said the last 2 days facial swelling worse and increased pain  Eating well No f/c No headache  No information for ent f/u   ----------- 08/25/2020 id f/u note Patient's swelling improved since transitioned to levoflox/flagyl No f/c/rash/n/v/diarrhea  He attemped to follow up with dr Ebbie Latus, his ent surgeon, but was told he can't see them unless he goes to the ed  Due to the swelling getting worse I attempted to get ct scan of face previous clinic but insurance refused and wouldn't allow preauthorization either  He is feeling well this admission   No Known Allergies    Outpatient Medications Prior to Visit  Medication Sig Dispense Refill   ALPRAZolam (XANAX) 0.5 MG tablet Take 0.5 mg by mouth at bedtime as needed for sleep.   4   amLODipine (NORVASC) 5  MG tablet Take 5 mg by mouth daily as needed (for high blood pressure).      ARIPiprazole (ABILIFY) 5 MG tablet Take 5 mg by mouth at bedtime.     buPROPion (WELLBUTRIN XL) 300 MG 24 hr tablet Take 300 mg by mouth daily.  1   clindamycin (CLEOCIN T) 1 % external solution Apply 1 application topically 2 (two) times daily as needed (boils).      clindamycin (CLEOCIN) 300 MG capsule Take 300 mg by mouth 2 (two) times daily as needed (boils).      docusate sodium (COLACE) 100 MG capsule Take 200 mg by mouth daily.     FLUoxetine (PROZAC) 20 MG capsule Take 20 mg by mouth daily.     levofloxacin (LEVAQUIN) 750 MG tablet Take 1 tablet (750 mg total) by mouth daily. 30 tablet 1   lisinopril-hydrochlorothiazide (ZESTORETIC) 20-25 MG tablet Take 1 tablet by mouth daily.     metroNIDAZOLE (FLAGYL) 500 MG tablet Take 1 tablet (500 mg total) by mouth 2 (two) times daily. 60 tablet 1   morphine (MS CONTIN) 15 MG 12 hr tablet Take 15 mg by mouth every 12 (twelve) hours.     oxyCODONE-acetaminophen (PERCOCET) 10-325 MG tablet Take 1 tablet by mouth every 4 (four) hours as needed for pain. 30 tablet 0   tamsulosin (FLOMAX) 0.4 MG CAPS capsule Take 0.4 mg by mouth daily as needed (urination).  No facility-administered medications prior to visit.     Social History   Socioeconomic History   Marital status: Married    Spouse name: Not on file   Number of children: Not on file   Years of education: Not on file   Highest education level: Not on file  Occupational History   Not on file  Tobacco Use   Smoking status: Every Day    Packs/day: 0.25    Years: 20.00    Pack years: 5.00    Types: Cigarettes   Smokeless tobacco: Never  Vaping Use   Vaping Use: Never used  Substance and Sexual Activity   Alcohol use: No    Alcohol/week: 0.0 standard drinks   Drug use: No   Sexual activity: Yes  Other Topics Concern   Not on file  Social History Narrative   09/22/2012 AHW Meikhi was born and grew up  in [place], [place]. He has 2 brothers and 4 sisters. He is the oldest. He reports his childhood was "pretty good." He was raised by his maternal grandmother. He achieved a GED. He has been married for 22 years. He has one son and one daughter. He lives with his wife. 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 His social support system consists of his wife and his father. 09/22/2012 AHW   Social Determinants of Health   Financial Resource Strain: Not on file  Food Insecurity: Not on file  Transportation Needs: Not on file  Physical Activity: Not on file  Stress: Not on file  Social Connections: Not on file  Intimate Partner Violence: Not on file      Review of Systems     Objective:    BP 99/66   Pulse 94   Temp 98 F (36.7 C) (Oral)   Wt 150 lb (68 kg)   SpO2 100%   BMI 22.81 kg/m  Nursing note and vital signs reviewed.  Physical Exam General/constitutional: no distress, pleasant HEENT: Normocephalic, PER, Conj Clear, EOMI, Oropharynx clear -- no facial swelling; no pus/sinus tract around the affected left upper tooth Neck supple CV: rrr no mrg Lungs: clear to auscultation, normal respiratory effort Abd: Soft, Nontender Ext: no edema Skin: No Rash Neuro: nonfocal MSK: no peripheral joint swelling/tenderness/warmth; back spines nontender         Labs: Lab Results  Component Value Date   WBC 14.9 (H) 08/09/2020   HGB 14.1 08/09/2020   HCT 42.1 08/09/2020   MCV 88.3 08/09/2020   PLT [id number] [id number]   Last metabolic panel Lab Results  Component Value Date   GLUCOSE 102 (H) 08/09/2020   NA 137 08/09/2020   K 4.5 08/09/2020   CL 98 08/09/2020   CO2 30 08/09/2020   BUN 16 08/09/2020   CREATININE 1.11 08/09/2020   GFRNONAA >60 08/07/2020   GFRAA >60 12/12/2018   CALCIUM 9.4 08/09/2020   PROT 6.5 08/09/2020   ALBUMIN 2.6 (L) 08/06/2020   BILITOT 0.4 08/09/2020   ALKPHOS 76 08/06/2020   AST 18 08/09/2020   ALT 89 (H) 08/09/2020    ANIONGAP 10 08/07/2020     Crp: 7/27     21 Micro:  Serology:  Imaging:  Assessment & Plan:   Problem List Items Addressed This Visit       Other   Facial abscess - Primary   Relevant Orders   CBC w/Diff   Comprehensive metabolic panel   C-reactive protein     No orders of the  defined types were placed in this encounter.       Lines:  Peripheral IV's   Abx: 7/27-c levoflox 7/27-c metronidazole  7/25-27 amox-clav  7/22-25 dapto 7/22-25 amp-sulbactam  7/22 vanc/piptazo                                                         Recent dexamethasone/clinda--> doxy outpatient   Assessment:  55 y.o. male smoker without diabetes mellitus, recurrent left face swelling/cellulitis admitted for same in setting of imaging face showing an abscess. Patient is s/p I&D 7/22 with odontogenic source tooth #11 removed as well   Clinically doing well with significantly improved swelling OR culture no mrsa but anticipate oral flora Bcx negative Hiv negative    7/27 assessment Operative cx gpc, gram variable rod, prevotella Increased swelling ?source control. Amox-clav should work so unlikely abx choice Need ent re-evaluation and repeat imaging Will change to levoflox/flagyl for better tissue penetration in case an abscess is coming back  8/12 assessment Patient's swelling had improved. Ct scan unable to be ordered per insurance Would continue treating with levo flagyl for another 4 weeks and f/u Need to see ent follow up     Plan: Continue 4 more weeks of levo/metro Spent 20 minutes alone talking to dr [employer] office about follow up with patient, and then was transferred to her voice mail. Left a message to call me back and discuss about patient Will get repeat cbc/cmp,crp today Follow up with me around 4 weeks  Follow-up: Return in about 4 weeks (around 09/22/2020).   I have spent a total of 40 minutes of face-to-face and non-face-to-face time, excluding  clinical staff time, preparing to see patient, ordering tests and/or medications, and provide counseling the patient  ------------ Addendum. I received a call from Dr Hayden Rasmussen [phone]) at 515 pm I will leave the specific of our conversation off this chart, but it appears she had wanted patient to follow up with another ENT surgeon (who had seen patient before).  I relayed to her that her clinic should arrange this with patient. I feel patient is getting the run around and ultimately poor care/untimely care  I feel patient could have ended up in the hospital again if the levoflox/flagyl didn't work. Fortunately it seems to be working.   Eitherway, he should still be seen by ENT for surgery follow up. Dr Hayden Rasmussen said she'll have her office contact patient Monday and discuss followup issue  Relayed to patient via mychart my message   Jabier Mutton, [place] for [place] [phone]  pager   [phone] cell 08/25/2020, 4:03 PM

## 2020-08-25 NOTE — Telephone Encounter (Signed)
relayed to patient that dr Lenox Ponds office will try to see about follow up with patient next week. also advise patient he might want to call his previous ENT's clinic and let them know about his recent surgery and arrange follow up with them as well.

## 2020-08-26 LAB — COMPREHENSIVE METABOLIC PANEL
AG Ratio: 1.4 (calc) (ref 1.0–2.5)
ALT: 10 U/L (ref 9–46)
AST: 13 U/L (ref 10–35)
Albumin: 3.5 g/dL — ABNORMAL LOW (ref 3.6–5.1)
Alkaline phosphatase (APISO): 72 U/L (ref 35–144)
BUN: 13 mg/dL (ref 7–25)
CO2: 28 mmol/L (ref 20–32)
Calcium: 9 mg/dL (ref 8.6–10.3)
Chloride: 106 mmol/L (ref 98–110)
Creat: 1.07 mg/dL (ref 0.70–1.30)
Globulin: 2.5 g/dL (calc) (ref 1.9–3.7)
Glucose, Bld: 83 mg/dL (ref 65–99)
Potassium: 4.3 mmol/L (ref 3.5–5.3)
Sodium: 141 mmol/L (ref 135–146)
Total Bilirubin: 0.3 mg/dL (ref 0.2–1.2)
Total Protein: 6 g/dL — ABNORMAL LOW (ref 6.1–8.1)

## 2020-08-26 LAB — CBC WITH DIFFERENTIAL/PLATELET
Absolute Monocytes: 602 cells/uL (ref 200–950)
Basophils Absolute: 43 cells/uL (ref 0–200)
Basophils Relative: 0.5 %
Eosinophils Absolute: 318 cells/uL (ref 15–500)
Eosinophils Relative: 3.7 %
HCT: 34.7 % — ABNORMAL LOW (ref 38.5–50.0)
Hemoglobin: 12.1 g/dL — ABNORMAL LOW (ref 13.2–17.1)
Lymphs Abs: 2898 cells/uL (ref 850–3900)
MCH: 30.3 pg (ref 27.0–33.0)
MCHC: 34.9 g/dL (ref 32.0–36.0)
MCV: 86.8 fL (ref 80.0–100.0)
MPV: 9 fL (ref 7.5–12.5)
Monocytes Relative: 7 %
Neutro Abs: 4739 cells/uL (ref 1500–7800)
Neutrophils Relative %: 55.1 %
Platelets: 304 10*3/uL (ref 140–400)
RBC: 4 10*6/uL — ABNORMAL LOW (ref 4.20–5.80)
RDW: 13.5 % (ref 11.0–15.0)
Total Lymphocyte: 33.7 %
WBC: 8.6 10*3/uL (ref 3.8–10.8)

## 2020-08-26 LAB — C-REACTIVE PROTEIN: CRP: 1.9 mg/L (ref <8.0)

## 2020-08-30 DIAGNOSIS — L0201 Cutaneous abscess of face: Secondary | ICD-10-CM | POA: Diagnosis not present

## 2020-09-12 ENCOUNTER — Encounter (HOSPITAL_COMMUNITY): Payer: Self-pay | Admitting: *Deleted

## 2020-09-12 ENCOUNTER — Emergency Department (HOSPITAL_COMMUNITY)
Admission: EM | Admit: 2020-09-12 | Discharge: 2020-09-12 | Disposition: A | Discharge status: Home / Self Care | Payer: Medicare HMO | Attending: Emergency Medicine | Admitting: Emergency Medicine

## 2020-09-12 ENCOUNTER — Other Ambulatory Visit: Payer: Self-pay

## 2020-09-12 DIAGNOSIS — R220 Localized swelling, mass and lump, head: Secondary | ICD-10-CM | POA: Diagnosis not present

## 2020-09-12 DIAGNOSIS — R69 Illness, unspecified: Secondary | ICD-10-CM | POA: Diagnosis not present

## 2020-09-12 DIAGNOSIS — R21 Rash and other nonspecific skin eruption: Secondary | ICD-10-CM | POA: Diagnosis not present

## 2020-09-12 DIAGNOSIS — X58XXXA Exposure to other specified factors, initial encounter: Secondary | ICD-10-CM | POA: Diagnosis not present

## 2020-09-12 DIAGNOSIS — L299 Pruritus, unspecified: Secondary | ICD-10-CM | POA: Diagnosis not present

## 2020-09-12 DIAGNOSIS — T7840XA Allergy, unspecified, initial encounter: Secondary | ICD-10-CM | POA: Insufficient documentation

## 2020-09-12 DIAGNOSIS — Z79899 Other long term (current) drug therapy: Secondary | ICD-10-CM | POA: Insufficient documentation

## 2020-09-12 DIAGNOSIS — F17210 Nicotine dependence, cigarettes, uncomplicated: Secondary | ICD-10-CM | POA: Insufficient documentation

## 2020-09-12 DIAGNOSIS — L509 Urticaria, unspecified: Secondary | ICD-10-CM | POA: Diagnosis not present

## 2020-09-12 DIAGNOSIS — I10 Essential (primary) hypertension: Secondary | ICD-10-CM | POA: Insufficient documentation

## 2020-09-12 MED ORDER — KETOROLAC TROMETHAMINE 30 MG/ML IJ SOLN
15.0000 mg | Freq: Once | INTRAMUSCULAR | Status: AC
  Administered 2020-09-12: 15 mg via INTRAVENOUS
  Filled 2020-09-12: qty 1

## 2020-09-12 MED ORDER — DIPHENHYDRAMINE HCL 50 MG/ML IJ SOLN
25.0000 mg | Freq: Once | INTRAMUSCULAR | Status: AC
  Administered 2020-09-12: 25 mg via INTRAVENOUS
  Filled 2020-09-12: qty 1

## 2020-09-12 MED ORDER — METHYLPREDNISOLONE SODIUM SUCC 125 MG IJ SOLR
125.0000 mg | Freq: Once | INTRAMUSCULAR | Status: AC
  Administered 2020-09-12: 125 mg via INTRAVENOUS
  Filled 2020-09-12: qty 2

## 2020-09-12 MED ORDER — FAMOTIDINE 20 MG PO TABS: 20.0000 mg | ORAL_TABLET | Freq: Two times a day (BID) | ORAL | 0 refills | Status: DC

## 2020-09-12 MED ORDER — PREDNISONE 50 MG PO TABS: 50.0000 mg | ORAL_TABLET | Freq: Every day | ORAL | 0 refills | Status: AC

## 2020-09-12 NOTE — ED Triage Notes (Signed)
Allergic reaction to hair dye, scalp is swollen and itching and also has some facial swelling

## 2020-09-12 NOTE — Discharge Instructions (Addendum)
You were seen in the ER today for your allergic reaction.  You were administered steroids and Benadryl through IV.  You have been prescribed steroids to take at home for the next few days as well as Pepcid which no other antihistamine medication.  Please continue take Benadryl for the next 24 to 72 hours after which time you may transition to a less sedating antihistamine such as Zyrtec (cetirizine).  Please take the prescribed medications as prescribed for the entire course and follow-up with your primary care doctor.  Return to the ER with any new pain or tingling in your mouth, swelling in your tongue, pain under tongue, swelling your neck, difficulty swallowing, difficulty breathing, or any other severe symptoms.

## 2020-09-18 NOTE — ED Provider Notes (Signed)
[facility] EMERGENCY DEPARTMENT Provider Note   CSN: [id number] Arrival date & time: 09/12/20  1655     History Chief Complaint  Patient presents with   Allergic Reaction    Benjamin Solomon is a 55 y.o. male who presents with concern for rash, itching, and swelling to his scalp and the sides of his face x3 days since using hair dye at home.  States he is used this formulation before without reaction, however current symptoms started the morning after he used his hair dye and have not resolved despite Benadryl.  Benadryl does seem to improve the swelling and itching, however has not completely resolved to the reaction.  He denies any numbness, tingling, swelling in his mouth, throat, or on his tongue.  Denies any pain in his neck or change in his voice.  Denies any difficulty breathing.  I personally read this patient's medical records.  His history polysubstance abuse, hypertension, BPH, and carpal tunnel.  HPI     Past Medical History:  Diagnosis Date   Anxiety    Arthritis    Benign prostatic hyperplasia with lower urinary tract symptoms    Carpal tunnel syndrome, right upper limb    Chronic back pain    Depression    Essential (primary) hypertension    Hemorrhoids    Hyperplastic colon polyp    Hypertension    Low back pain    Major depressive disorder, single episode, unspecified    Opioid dependence ([place])    Tubular adenoma    Unspecified osteoarthritis, unspecified site     Patient Active Problem List   Diagnosis Date Noted   Facial abscess 08/04/2020   Sepsis ([place])    Essential (primary) hypertension    Low back pain    Major depressive disorder, single episode, unspecified    Benign prostatic hyperplasia with lower urinary tract symptoms    Carpal tunnel syndrome, right upper limb    Unspecified osteoarthritis, unspecified site    S/P lumbar spinal fusion 08/07/2016   Generalized anxiety disorder 10/04/2012   Alcohol dependence ([place]) 06/26/2012    Cocaine dependence ([place]) 06/26/2012   Depressive disorder, not elsewhere classified 06/26/2012   Bleeding external hemorrhoids 12/17/2011    Past Surgical History:  Procedure Laterality Date   CARPAL TUNNEL RELEASE Right 02/17/2018   Procedure: CARPAL TUNNEL RELEASE;  Surgeon: Daryll Brod, MD;  Location: [place];  Service: Orthopedics;  Laterality: Right;   CERVICAL SPINE SURGERY  2009   COLONOSCOPY     EVALUATION UNDER ANESTHESIA WITH HEMORRHOIDECTOMY  12/20/2011   FOOT SURGERY Right 2012   bone removed from the 5 th toe   INCISION AND DRAINAGE ABSCESS N/A 08/04/2020   Procedure: INCISION AND DRAINAGE ABSCESS;  Surgeon: Jason Coop, DO;  Location: [place];  Service: ENT;  Laterality: N/A;   NASAL ENDOSCOPY N/A 08/04/2020   Procedure: NASAL ENDOSCOPY;  Surgeon: Jason Coop, DO;  Location: [place];  Service: ENT;  Laterality: N/A;   TOOTH EXTRACTION N/A 08/04/2020   Procedure: DENTAL EXTRACTION TOOTH #11;  Surgeon: Newt Lukes, DMD;  Location: [place];  Service: Dentistry;  Laterality: N/A;       Family History  Problem Relation Age of Onset   Hypertension Mother    Hypertension Father    Alcohol abuse Brother    Drug abuse Brother    Diabetes Neg Hx    Heart disease Neg Hx    Kidney disease Neg Hx    Liver  disease Neg Hx     Social History   Tobacco Use   Smoking status: Every Day    Packs/day: 0.25    Years: 20.00    Pack years: 5.00    Types: Cigarettes   Smokeless tobacco: Never  Vaping Use   Vaping Use: Never used  Substance Use Topics   Alcohol use: No    Alcohol/week: 0.0 standard drinks   Drug use: No    Home Medications Prior to Admission medications   Medication Sig Start Date End Date Taking? Authorizing Provider  famotidine (PEPCID) 20 MG tablet Take 1 tablet (20 mg total) by mouth 2 (two) times daily for 5 days. 09/12/20 09/17/20 Yes Nikolaus Pienta, Gypsy Balsam, PA-C  ALPRAZolam (XANAX) 0.5 MG tablet Take 0.5 mg by mouth at  bedtime as needed for sleep.  06/02/16   [provider]  amLODipine (NORVASC) 5 MG tablet Take 5 mg by mouth daily as needed (for high blood pressure).  01/27/13   [provider]  ARIPiprazole (ABILIFY) 5 MG tablet Take 5 mg by mouth at bedtime. 03/20/20   [provider]  buPROPion (WELLBUTRIN XL) 300 MG 24 hr tablet Take 300 mg by mouth daily. 05/18/16   [provider]  clindamycin (CLEOCIN T) 1 % external solution Apply 1 application topically 2 (two) times daily as needed (boils).  10/13/15   [provider]  clindamycin (CLEOCIN) 300 MG capsule Take 300 mg by mouth 2 (two) times daily as needed (boils).  10/12/15   [provider]  docusate sodium (COLACE) 100 MG capsule Take 200 mg by mouth daily.    [provider]  FLUoxetine (PROZAC) 20 MG capsule Take 20 mg by mouth daily. 07/31/20   [provider]  levofloxacin (LEVAQUIN) 750 MG tablet Take 1 tablet (750 mg total) by mouth daily for 28 days. 08/25/20 09/22/20  Vu, Johnny Bridge T, MD  lisinopril-hydrochlorothiazide (ZESTORETIC) 20-25 MG tablet Take 1 tablet by mouth daily. 06/19/20   [provider]  metroNIDAZOLE (FLAGYL) 500 MG tablet Take 1 tablet (500 mg total) by mouth 2 (two) times daily for 28 days. 08/25/20 09/22/20  Vu, Johnny Bridge T, MD  morphine (MS CONTIN) 15 MG 12 hr tablet Take 15 mg by mouth every 12 (twelve) hours. 07/14/20   [provider]  oxyCODONE-acetaminophen (PERCOCET) 10-325 MG tablet Take 1 tablet by mouth every 4 (four) hours as needed for pain. 08/08/16   Eustace Moore, MD  tamsulosin (FLOMAX) 0.4 MG CAPS capsule Take 0.4 mg by mouth daily as needed (urination).    [provider]    Allergies    Patient has no known allergies.  Review of Systems   Review of Systems  Constitutional: Negative.   HENT: Negative.    Eyes: Negative.   Respiratory: Negative.    Cardiovascular: Negative.   Gastrointestinal: Negative.   Endocrine:  Negative.   Genitourinary: Negative.   Musculoskeletal: Negative.   Skin:  Positive for rash.  Neurological: Negative.   Hematological: Negative.   Psychiatric/Behavioral: Negative.     Physical Exam Updated Vital Signs BP 113/71   Pulse (!) 50   Temp 98.3 F (36.8 C) (Oral)   Resp 20   SpO2 97%   Physical Exam Vitals and nursing note reviewed.  Constitutional:      Appearance: He is not ill-appearing or toxic-appearing.  HENT:     Head: Normocephalic and atraumatic.      Nose: Nose normal. No congestion.  Mouth/Throat:     Mouth: Mucous membranes are moist.     Tongue: No lesions. Tongue does not deviate from midline.     Palate: No mass.     Pharynx: Oropharynx is clear. Uvula midline. No oropharyngeal exudate or posterior oropharyngeal erythema.     Tonsils: No tonsillar exudate.     Comments: No sublingual or submental tenderness palpation, no oropharyngeal abscess or lingual edema. Eyes:     General: Lids are normal. Vision grossly intact.        Right eye: No discharge.        Left eye: No discharge.     Extraocular Movements: Extraocular movements intact.     Conjunctiva/sclera: Conjunctivae normal.     Pupils: Pupils are equal, round, and reactive to light.  Neck:     Trachea: Trachea and phonation normal.     Meningeal: Brudzinski's sign and Kernig's sign absent.  Cardiovascular:     Rate and Rhythm: Normal rate and regular rhythm.     Pulses: Normal pulses.     Heart sounds: Normal heart sounds. No murmur heard. Pulmonary:     Effort: Pulmonary effort is normal. No tachypnea, bradypnea, accessory muscle usage or respiratory distress.     Breath sounds: Normal breath sounds. No wheezing or rales.  Chest:     Chest wall: No mass, lacerations, deformity, swelling, tenderness, crepitus or edema.  Abdominal:     General: Bowel sounds are normal. There is no distension.     Palpations: Abdomen is soft.     Tenderness: There is no abdominal tenderness.   Musculoskeletal:        General: No deformity.     Cervical back: Normal range of motion and neck supple. No edema, rigidity or crepitus. No pain with movement, spinous process tenderness or muscular tenderness.     Right lower leg: No edema.     Left lower leg: No edema.  Lymphadenopathy:     Cervical: No cervical adenopathy.  Skin:    General: Skin is warm and dry.     Capillary Refill: Capillary refill takes less than 2 seconds.  Neurological:     General: No focal deficit present.     Mental Status: He is alert and oriented to person, place, and time. Mental status is at baseline.  Psychiatric:        Mood and Affect: Mood normal.    ED Results / Procedures / Treatments   Labs (all labs ordered are listed, but only abnormal results are displayed) Labs Reviewed - No data to display  EKG None  Radiology No results found.  Procedures Procedures   Medications Ordered in ED Medications  methylPREDNISolone sodium succinate (SOLU-MEDROL) 125 mg/2 mL injection 125 mg (125 mg Intravenous Given 09/12/20 1833)  diphenhydrAMINE (BENADRYL) injection 25 mg (25 mg Intravenous Given 09/12/20 1833)  ketorolac (TORADOL) 30 MG/ML injection 15 mg (15 mg Intravenous Given 09/12/20 1833)  ketorolac (TORADOL) 30 MG/ML injection 15 mg (15 mg Intravenous Given 09/12/20 2011)    ED Course  I have reviewed the triage vital signs and the nursing notes.  Pertinent labs & imaging results that were available during my care of the patient were reviewed by me and considered in my medical decision making (see chart for details).    MDM Rules/Calculators/A&P                         55 year old male presents with concern for scalp itching,  swelling, and pain x3 days since using at home hair dye.  Differential diagnosis includes is not limited to acute allergic reaction/contact dermatitis, cellulitis, erysipelas.  Vital signs are normal intake.  Cardiopulmonary exam is normal, abdominal exam is  benign.  Oropharyngeal exam without sign of oropharyngeal abscess, angioedema, Ludwick's angina.  No lingual or sublingual edema or tenderness palpation, no submental tenderness palpation or anterior neck crepitus or swelling.  Scalp findings as above with edema, urticarial rash, and swelling.  No sign of airway involvement, will hold off on epinephrine at this time.  Patient offered Solu-Medrol, Toradol, Benadryl.  Will reevaluate.  Patient reevaluated after administration of steroids and Benadryl with significant improvement in the swelling and reported improvement in his itching.  Given improvement in symptoms and lack of airway involvement throughout his stay in the emergency department, no indication for epinephrine or any other further work-up in the emergency department at this time.  Suspect his symptoms are secondary to an acute contact dermatitis from his hair dye.  Will recommend antihistamines at home with Benadryl + Pepcid, then transition to Zyrtec.  We will also offer steroid burst.  Shanon Brow voiced understanding with medical evaluation and treatment plan.  Each of his questions answered to his expressed satisfaction.  Return precautions are given.  Patient is well-appearing, stable, and appropriate for discharge at this time.  This chart was dictated using voice recognition software, Dragon. Despite the best efforts of this provider to proofread and correct errors, errors may still occur which can change documentation meaning.  Final Clinical Impression(s) / ED Diagnoses Final diagnoses:  Allergic reaction, initial encounter    Rx / DC Orders ED Discharge Orders          Ordered    predniSONE (DELTASONE) 50 MG tablet  Daily        09/12/20 1952    famotidine (PEPCID) 20 MG tablet  2 times daily        09/12/20 [address], Sharlene Dory 09/18/20 [id number]    Sherwood Gambler, MD 09/19/20 [phone]

## 2020-09-20 DIAGNOSIS — M961 Postlaminectomy syndrome, not elsewhere classified: Secondary | ICD-10-CM | POA: Diagnosis not present

## 2020-09-20 DIAGNOSIS — M5136 Other intervertebral disc degeneration, lumbar region: Secondary | ICD-10-CM | POA: Diagnosis not present

## 2020-09-20 DIAGNOSIS — M47817 Spondylosis without myelopathy or radiculopathy, lumbosacral region: Secondary | ICD-10-CM | POA: Diagnosis not present

## 2020-09-20 DIAGNOSIS — G894 Chronic pain syndrome: Secondary | ICD-10-CM | POA: Diagnosis not present

## 2020-09-21 ENCOUNTER — Ambulatory Visit: Payer: Medicare HMO | Admitting: Internal Medicine

## 2020-09-21 ENCOUNTER — Other Ambulatory Visit: Payer: Self-pay

## 2020-09-21 ENCOUNTER — Encounter: Payer: Self-pay | Admitting: Internal Medicine

## 2020-09-21 VITALS — BP 110/74 | HR 92 | Resp 16 | Ht 68.0 in | Wt 157.0 lb

## 2020-09-21 DIAGNOSIS — L0201 Cutaneous abscess of face: Secondary | ICD-10-CM

## 2020-09-21 NOTE — Progress Notes (Signed)
[facility]  Patient Active Problem List   Diagnosis Date Noted   Facial abscess 08/04/2020   Sepsis ([place])    Essential (primary) hypertension    Low back pain    Major depressive disorder, single episode, unspecified    Benign prostatic hyperplasia with lower urinary tract symptoms    Carpal tunnel syndrome, right upper limb    Unspecified osteoarthritis, unspecified site    S/P lumbar spinal fusion 08/07/2016   Generalized anxiety disorder 10/04/2012   Alcohol dependence ([place]) 06/26/2012   Cocaine dependence ([place]) 06/26/2012   Depressive disorder, not elsewhere classified 06/26/2012   Bleeding external hemorrhoids 12/17/2011      Subjective:    Patient ID: Benjamin Solomon, male    DOB: 03/18/1965, 55 y.o.   MRN: [id number]  Chief Complaint  Patient presents with   Follow-up    HPI:  Benjamin Solomon is a 55 y.o. male here for facial abscess f/u  Patient had I&D 7/22; cx gpc, gram variable rod, and prevotella. Bcx negative. Discharged on amox-clav  He said the last 2 days facial swelling worse and increased pain  Eating well No f/c No headache  No information for ent f/u   ----------- 08/25/2020 id f/u note Patient's swelling improved since transitioned to levoflox/flagyl No f/c/rash/n/v/diarrhea  He attemped to follow up with dr Benjamin Solomon, his ent surgeon, but was told he can't see them unless he goes to the ed  Due to the swelling getting worse I attempted to get ct scan of face previous clinic but insurance refused and wouldn't allow preauthorization either  9/08 id f/u Patient continued on 4 weeks of levo/flagyl on last visit  He also f/u'ed with his ENT doc and there was no concern from my chart review Denies f/c/n/v/diarrhea Facial swelling resolved Eating well  Insurance had refused ct scan previously     Outpatient Medications Prior to Visit  Medication Sig Dispense Refill   ALPRAZolam  (XANAX) 0.5 MG tablet Take 0.5 mg by mouth at bedtime as needed for sleep.   4   amLODipine (NORVASC) 5 MG tablet Take 5 mg by mouth daily as needed (for high blood pressure).      ARIPiprazole (ABILIFY) 5 MG tablet Take 5 mg by mouth at bedtime.     buPROPion (WELLBUTRIN XL) 300 MG 24 hr tablet Take 300 mg by mouth daily.  1   clindamycin (CLEOCIN T) 1 % external solution Apply 1 application topically 2 (two) times daily as needed (boils).      clindamycin (CLEOCIN) 300 MG capsule Take 300 mg by mouth 2 (two) times daily as needed (boils).      docusate sodium (COLACE) 100 MG capsule Take 200 mg by mouth daily.     FLUoxetine (PROZAC) 20 MG capsule Take 20 mg by mouth daily.     levofloxacin (LEVAQUIN) 750 MG tablet Take 1 tablet (750 mg total) by mouth daily for 28 days. 28 tablet 0   lisinopril-hydrochlorothiazide (ZESTORETIC) 20-25 MG tablet Take 1 tablet by mouth daily.     metroNIDAZOLE (FLAGYL) 500 MG tablet Take 1 tablet (500 mg total) by mouth 2 (two) times daily for 28 days. 56 tablet 0   morphine (MS CONTIN) 15 MG 12 hr tablet Take 15 mg by mouth every 12 (twelve) hours.     oxyCODONE-acetaminophen (PERCOCET) 10-325 MG tablet Take 1 tablet by mouth every 4 (four) hours as needed for pain. 30 tablet  0   tamsulosin (FLOMAX) 0.4 MG CAPS capsule Take 0.4 mg by mouth daily as needed (urination).     famotidine (PEPCID) 20 MG tablet Take 1 tablet (20 mg total) by mouth 2 (two) times daily for 5 days. 10 tablet 0   No facility-administered medications prior to visit.     Social History   Socioeconomic History   Marital status: Married    Spouse name: Not on file   Number of children: Not on file   Years of education: Not on file   Highest education level: Not on file  Occupational History   Not on file  Tobacco Use   Smoking status: Every Day    Packs/day: 0.25    Years: 20.00    Pack years: 5.00    Types: Cigarettes   Smokeless tobacco: Never  Vaping Use   Vaping Use: Never  used  Substance and Sexual Activity   Alcohol use: No    Alcohol/week: 0.0 standard drinks   Drug use: No   Sexual activity: Yes  Other Topics Concern   Not on file  Social History Narrative   09/22/2012 Benjamin Solomon was born and grew up in [place], [place]. He has 2 brothers and 4 sisters. He is the oldest. He reports his childhood was "pretty good." He was raised by his maternal grandmother. He achieved a GED. He has been married for 22 years. He has one son and one daughter. He lives with his wife. 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 His social support system consists of his wife and his father. 09/22/2012 Benjamin   Social Determinants of Health   Financial Resource Strain: Not on file  Food Insecurity: Not on file  Transportation Needs: Not on file  Physical Activity: Not on file  Stress: Not on file  Social Connections: Not on file  Intimate Partner Violence: Not on file      Review of Systems    Other ros negative  Objective:    BP 110/74   Pulse 92   Resp 16   Ht '5\' 8"'$  (1.727 m)   Wt 157 lb (71.2 kg)   SpO2 99%   BMI 23.87 kg/m  Nursing note and vital signs reviewed.  Physical Exam         Labs: Lab Results  Component Value Date   WBC 8.6 08/25/2020   HGB 12.1 (L) 08/25/2020   HCT 34.7 (L) 08/25/2020   MCV 86.8 08/25/2020   PLT 304 [id number]   Last metabolic panel Lab Results  Component Value Date   GLUCOSE 83 08/25/2020   NA 141 08/25/2020   K 4.3 08/25/2020   CL 106 08/25/2020   CO2 28 08/25/2020   BUN 13 08/25/2020   CREATININE 1.07 08/25/2020   GFRNONAA >60 08/07/2020   GFRAA >60 12/12/2018   CALCIUM 9.0 08/25/2020   PROT 6.0 (L) 08/25/2020   ALBUMIN 2.6 (L) 08/06/2020   BILITOT 0.3 08/25/2020   ALKPHOS 76 08/06/2020   AST 13 08/25/2020   ALT 10 08/25/2020   ANIONGAP 10 08/07/2020     Crp: 7/27     21 Micro:  Serology:  Imaging:  Assessment & Plan:   Problem List Items Addressed This Visit    None   No orders of the defined types were placed in this encounter.       Lines:  Peripheral IV's   Abx: 7/27-c levoflox 7/27-c metronidazole  7/25-27 amox-clav  7/22-25 dapto 7/22-25 amp-sulbactam  7/22 vanc/piptazo                                                         Recent dexamethasone/clinda--> doxy outpatient   Assessment:  55 y.o. male smoker without diabetes mellitus, recurrent left face swelling/cellulitis admitted for same in setting of imaging face showing an abscess. Patient is s/p I&D 7/22 with odontogenic source tooth #11 removed as well   Clinically doing well with significantly improved swelling OR culture no mrsa but anticipate oral flora Bcx negative Hiv negative    7/27 assessment Operative cx gpc, gram variable rod, prevotella Increased swelling ?source control. Amox-clav should work so unlikely abx choice Need ent re-evaluation and repeat imaging Will change to levoflox/flagyl for better tissue penetration in case an abscess is coming back  8/12 assessment Patient's swelling had improved. Ct scan unable to be ordered per insurance Would continue treating with levo flagyl for another 4 weeks and f/u Need to see ent follow up  9/8 assessment Patient has 1 more week of levo/flagyl left Clinically infection responded well on this combination of antibiotics No ct scan repeat was allowed per insurance Plan is to finish the 1 week of abx and monitor for recurrence and manage accordingly   Plan: Finish 1 more week of levo/metro Call id clinic/ent clinic if off abx, the facial swelling/pain recurs/persists associated with fever/chill Labs to assess antibiotics toxicity  Follow-up: No follow-ups on file.    Jabier Mutton, [facility] [phone]  pager   [phone] cell 09/21/2020, 4:15 PM

## 2020-09-21 NOTE — Patient Instructions (Signed)
Please continue to monitor for persistent progressive facial swelling/pain associated with fever/chill when you are finished with antibiotics. If it occurs please call id clinic and ent clinic  Will get blood test today to assess antibiotics toxicity and inflammatory level

## 2020-10-15 ENCOUNTER — Ambulatory Visit (HOSPITAL_COMMUNITY)
Admission: EM | Admit: 2020-10-15 | Discharge: 2020-10-15 | Disposition: A | Discharge status: Home / Self Care | Payer: Medicare HMO | Attending: Emergency Medicine | Admitting: Emergency Medicine

## 2020-10-15 ENCOUNTER — Other Ambulatory Visit: Payer: Self-pay

## 2020-10-15 DIAGNOSIS — L235 Allergic contact dermatitis due to other chemical products: Secondary | ICD-10-CM | POA: Diagnosis not present

## 2020-10-15 DIAGNOSIS — T7840XA Allergy, unspecified, initial encounter: Secondary | ICD-10-CM | POA: Diagnosis not present

## 2020-10-15 MED ORDER — CLOBETASOL PROPIONATE 0.05 % EX FOAM: Freq: Two times a day (BID) | CUTANEOUS | 0 refills | Status: AC

## 2020-10-15 MED ORDER — METHYLPREDNISOLONE SODIUM SUCC 125 MG IJ SOLR
INTRAMUSCULAR | Status: AC
  Filled 2020-10-15: qty 2

## 2020-10-15 MED ORDER — METHYLPREDNISOLONE 4 MG PO TBPK: ORAL_TABLET | ORAL | 0 refills | Status: DC

## 2020-10-15 MED ORDER — METHYLPREDNISOLONE SODIUM SUCC 125 MG IJ SOLR
125.0000 mg | Freq: Once | INTRAMUSCULAR | Status: AC
  Administered 2020-10-15: 125 mg via INTRAMUSCULAR

## 2020-10-15 NOTE — ED Triage Notes (Signed)
Pt presents with allergic reaction to hair & face from a new hair dye he used yesterday; pt states he has itchiness, pain, and swelling.

## 2020-10-15 NOTE — Discharge Instructions (Addendum)
He received an injection of Solu-Medrol in the clinic today.  I have sent a prescription for the same steroid to your pharmacy, it comes in the form of a blister pack, please take 1 row daily for the next 6 days.  Have also prescribed you a topical steroid called clobetasol which comes in the form of a foam, please apply twice daily.  Please return for further evaluation if your symptoms do not resolve after 6 days.

## 2020-10-15 NOTE — ED Provider Notes (Signed)
[place]    CSN: [id number] Arrival date & time: 10/15/20  1608      History   Chief Complaint Chief Complaint  Patient presents with   Allergic Reaction    HPI Benjamin Solomon is a 55 y.o. male.   Patient states he used an over-the-counter men's hair cream to remove gray from his hair, states that within hours he began to have burning his scalp.  This occurred yesterday.  Patient states when he woke up this morning, noticed that his scalp was diffusely red, hot and swollen.  States the area itches but is so painful that he cannot scratch it.  Patient states he is never uses her product before.  Patient states he does not have a known history of skin reactions.  Patient denies headache, fever, aches, chills, difficulty breathing, lip swelling or rash otherwise.  The history is provided by the patient.   Past Medical History:  Diagnosis Date   Anxiety    Arthritis    Benign prostatic hyperplasia with lower urinary tract symptoms    Carpal tunnel syndrome, right upper limb    Chronic back pain    Depression    Essential (primary) hypertension    Hemorrhoids    Hyperplastic colon polyp    Hypertension    Low back pain    Major depressive disorder, single episode, unspecified    Opioid dependence ([place])    Tubular adenoma    Unspecified osteoarthritis, unspecified site     Patient Active Problem List   Diagnosis Date Noted   Facial abscess 08/04/2020   Sepsis ([place])    Essential (primary) hypertension    Low back pain    Major depressive disorder, single episode, unspecified    Benign prostatic hyperplasia with lower urinary tract symptoms    Carpal tunnel syndrome, right upper limb    Unspecified osteoarthritis, unspecified site    S/P lumbar spinal fusion 08/07/2016   Generalized anxiety disorder 10/04/2012   Alcohol dependence ([place]) 06/26/2012   Cocaine dependence ([place]) 06/26/2012   Depressive disorder, not elsewhere classified 06/26/2012    Bleeding external hemorrhoids 12/17/2011    Past Surgical History:  Procedure Laterality Date   CARPAL TUNNEL RELEASE Right 02/17/2018   Procedure: CARPAL TUNNEL RELEASE;  Surgeon: Daryll Brod, MD;  Location: [facility];  Service: Orthopedics;  Laterality: Right;   CERVICAL SPINE SURGERY  2009   COLONOSCOPY     EVALUATION UNDER ANESTHESIA WITH HEMORRHOIDECTOMY  12/20/2011   FOOT SURGERY Right 2012   bone removed from the 5 th toe   INCISION AND DRAINAGE ABSCESS N/A 08/04/2020   Procedure: INCISION AND DRAINAGE ABSCESS;  Surgeon: Jason Coop, DO;  Location: [facility];  Service: ENT;  Laterality: N/A;   NASAL ENDOSCOPY N/A 08/04/2020   Procedure: NASAL ENDOSCOPY;  Surgeon: Jason Coop, DO;  Location: [facility];  Service: ENT;  Laterality: N/A;   TOOTH EXTRACTION N/A 08/04/2020   Procedure: DENTAL EXTRACTION TOOTH #11;  Surgeon: Newt Lukes, DMD;  Location: [facility];  Service: Dentistry;  Laterality: N/A;       Home Medications    Prior to Admission medications   Medication Sig Start Date End Date Taking? Authorizing Provider  clobetasol (OLUX) 0.05 % topical foam Apply topically 2 (two) times daily for 10 days. Discontinue once symptoms resolve or after 10 days, whichever comes first. 10/15/20 10/25/20 Yes Lynden Oxford Scales, PA-C  methylPREDNISolone (MEDROL DOSEPAK) 4 MG TBPK tablet Take 24 mg  on day 1, 20 mg on day 2, 16 mg on day 3, 12 mg on day 4, 8 mg on day 5, 4 mg on day 6. 10/15/20  Yes Lynden Oxford Scales, PA-C  ALPRAZolam Duanne Moron) 0.5 MG tablet Take 0.5 mg by mouth at bedtime as needed for sleep.  06/02/16   [provider]  amLODipine (NORVASC) 5 MG tablet Take 5 mg by mouth daily as needed (for high blood pressure).  01/27/13   [provider]  ARIPiprazole (ABILIFY) 5 MG tablet Take 5 mg by mouth at bedtime. 03/20/20   [provider]  buPROPion (WELLBUTRIN XL) 300 MG 24 hr tablet Take 300 mg by mouth daily. 05/18/16   [provider]  clindamycin (CLEOCIN T) 1 % external solution Apply 1 application topically 2 (two) times daily as needed (boils).  10/13/15   [provider]  clindamycin (CLEOCIN) 300 MG capsule Take 300 mg by mouth 2 (two) times daily as needed (boils).  10/12/15   [provider]  docusate sodium (COLACE) 100 MG capsule Take 200 mg by mouth daily.    [provider]  famotidine (PEPCID) 20 MG tablet Take 1 tablet (20 mg total) by mouth 2 (two) times daily for 5 days. 09/12/20 09/17/20  Sponseller, Eugene Garnet R, PA-C  FLUoxetine (PROZAC) 20 MG capsule Take 20 mg by mouth daily. 07/31/20   [provider]  lisinopril-hydrochlorothiazide (ZESTORETIC) 20-25 MG tablet Take 1 tablet by mouth daily. 06/19/20   [provider]  morphine (MS CONTIN) 15 MG 12 hr tablet Take 15 mg by mouth every 12 (twelve) hours. 07/14/20   [provider]  oxyCODONE-acetaminophen (PERCOCET) 10-325 MG tablet Take 1 tablet by mouth every 4 (four) hours as needed for pain. 08/08/16   Eustace Moore, MD  tamsulosin (FLOMAX) 0.4 MG CAPS capsule Take 0.4 mg by mouth daily as needed (urination).    [provider]    Family History Family History  Problem Relation Age of Onset   Hypertension Mother    Hypertension Father    Alcohol abuse Brother    Drug abuse Brother    Diabetes Neg Hx    Heart disease Neg Hx    Kidney disease Neg Hx    Liver disease Neg Hx     Social History Social History   Tobacco Use   Smoking status: Every Day    Packs/day: 0.25    Years: 20.00    Pack years: 5.00    Types: Cigarettes   Smokeless tobacco: Never  Vaping Use   Vaping Use: Never used  Substance Use Topics   Alcohol use: No    Alcohol/week: 0.0 standard drinks   Drug use: No     Allergies   Patient has no known allergies.   Review of Systems Review of Systems   Physical Exam Triage Vital Signs ED Triage Vitals  Enc Vitals Group     BP 10/15/20 1639 (!)  157/88     Pulse Rate 10/15/20 1639 86     Resp 10/15/20 1639 18     Temp 10/15/20 1639 98.7 F (37.1 C)     Temp Source 10/15/20 1639 Oral     SpO2 10/15/20 1639 97 %     Weight --      Height --      Head Circumference --      Peak Flow --      Pain Score 10/15/20 1641 5     Pain  Loc --      Pain Edu? --      Excl. in [place]? --    No data found.  Updated Vital Signs BP (!) 157/88 (BP Location: Right Arm)   Pulse 86   Temp 98.7 F (37.1 C) (Oral)   Resp 18   SpO2 97%   Visual Acuity Right Eye Distance:   Left Eye Distance:   Bilateral Distance:    Right Eye Near:   Left Eye Near:    Bilateral Near:     Physical Exam Vitals and nursing note reviewed.  Constitutional:      Appearance: Normal appearance.  HENT:     Head: Normocephalic and atraumatic.     Right Ear: Tympanic membrane, ear canal and external ear normal.     Left Ear: Tympanic membrane, ear canal and external ear normal.     Nose: Nose normal.     Mouth/Throat:     Mouth: Mucous membranes are moist.     Pharynx: Oropharynx is clear.  Eyes:     Extraocular Movements: Extraocular movements intact.     Conjunctiva/sclera: Conjunctivae normal.     Pupils: Pupils are equal, round, and reactive to light.  Cardiovascular:     Rate and Rhythm: Normal rate and regular rhythm.     Heart sounds: Normal heart sounds.  Pulmonary:     Effort: Pulmonary effort is normal.     Breath sounds: Normal breath sounds.  Abdominal:     General: Abdomen is flat. Bowel sounds are normal.     Palpations: Abdomen is soft.  Musculoskeletal:        General: Normal range of motion.     Cervical back: Normal range of motion and neck supple.  Skin:    General: Skin is warm and dry.     Findings: Rash (Atopy apparent over entire scalp with diffuse swelling of scalp tissue) present.  Neurological:     General: No focal deficit present.     Mental Status: He is alert and oriented to person, place, and time.  Psychiatric:         Mood and Affect: Mood normal.        Behavior: Behavior normal.     UC Treatments / Results  Labs (all labs ordered are listed, but only abnormal results are displayed) Labs Reviewed - No data to display  EKG   Radiology No results found.  Procedures Procedures (including critical care time)  Medications Ordered in UC Medications  methylPREDNISolone sodium succinate (SOLU-MEDROL) 125 mg/2 mL injection 125 mg (has no administration in time range)    Initial Impression / Assessment and Plan / UC Course  I have reviewed the triage vital signs and the nursing notes.  Pertinent labs & imaging results that were available during my care of the patient were reviewed by me and considered in my medical decision making (see chart for details).     Is having an acute reaction to hair cream.  Patient was provided with an injection of Solu-Medrol for diffuse swelling of his scalp as well as a follow-up oral dose of Medrol dose pack.  Patient was also given a topical prescription of clobetasol foam.  Patient was advised to seek other methods of treating his for hair, patient verbalized understanding and agreement with plan.  All questions were addressed. Final Clinical Impressions(s) / UC Diagnoses   Final diagnoses:  Allergic reaction, initial encounter  Allergic dermatitis due to other chemical product  Discharge Instructions      He received an injection of Solu-Medrol in the clinic today.  Scription for the same steroid to your pharmacy, it comes in the form of a blister pack, please take 1 row daily for the next 6 days.  Have also prescribed you a topical steroid called clobetasol which comes in the form of a foam, please apply twice daily.  Please return for further evaluation if your symptoms do not resolve after 6 days.     ED Prescriptions     Medication Sig Dispense Auth. Provider   methylPREDNISolone (MEDROL DOSEPAK) 4 MG TBPK tablet Take 24 mg on day 1, 20 mg  on day 2, 16 mg on day 3, 12 mg on day 4, 8 mg on day 5, 4 mg on day 6. 21 tablet Lynden Oxford Scales, PA-C   clobetasol (OLUX) 0.05 % topical foam Apply topically 2 (two) times daily for 10 days. Discontinue once symptoms resolve or after 10 days, whichever comes first. 50 g Lynden Oxford Scales, PA-C      PDMP not reviewed this encounter.   Lynden Oxford Scales, PA-C 10/15/20 1724

## 2020-10-20 DIAGNOSIS — B370 Candidal stomatitis: Secondary | ICD-10-CM | POA: Diagnosis not present

## 2020-10-25 DIAGNOSIS — M5412 Radiculopathy, cervical region: Secondary | ICD-10-CM | POA: Diagnosis not present

## 2020-10-25 DIAGNOSIS — Z79899 Other long term (current) drug therapy: Secondary | ICD-10-CM | POA: Diagnosis not present

## 2020-10-25 DIAGNOSIS — M5136 Other intervertebral disc degeneration, lumbar region: Secondary | ICD-10-CM | POA: Diagnosis not present

## 2020-10-25 DIAGNOSIS — G894 Chronic pain syndrome: Secondary | ICD-10-CM | POA: Diagnosis not present

## 2020-10-25 DIAGNOSIS — Z79891 Long term (current) use of opiate analgesic: Secondary | ICD-10-CM | POA: Diagnosis not present

## 2020-10-25 DIAGNOSIS — M47817 Spondylosis without myelopathy or radiculopathy, lumbosacral region: Secondary | ICD-10-CM | POA: Diagnosis not present

## 2020-11-14 DIAGNOSIS — R0989 Other specified symptoms and signs involving the circulatory and respiratory systems: Secondary | ICD-10-CM | POA: Diagnosis not present

## 2020-11-20 DIAGNOSIS — R0989 Other specified symptoms and signs involving the circulatory and respiratory systems: Secondary | ICD-10-CM | POA: Diagnosis not present

## 2020-11-22 DIAGNOSIS — M961 Postlaminectomy syndrome, not elsewhere classified: Secondary | ICD-10-CM | POA: Diagnosis not present

## 2020-11-22 DIAGNOSIS — M5412 Radiculopathy, cervical region: Secondary | ICD-10-CM | POA: Diagnosis not present

## 2020-11-22 DIAGNOSIS — G894 Chronic pain syndrome: Secondary | ICD-10-CM | POA: Diagnosis not present

## 2020-11-22 DIAGNOSIS — M5136 Other intervertebral disc degeneration, lumbar region: Secondary | ICD-10-CM | POA: Diagnosis not present

## 2020-11-23 ENCOUNTER — Other Ambulatory Visit: Payer: Self-pay | Admitting: Neurological Surgery

## 2020-11-29 DIAGNOSIS — R0989 Other specified symptoms and signs involving the circulatory and respiratory systems: Secondary | ICD-10-CM | POA: Diagnosis not present

## 2020-11-29 DIAGNOSIS — K219 Gastro-esophageal reflux disease without esophagitis: Secondary | ICD-10-CM | POA: Diagnosis not present

## 2020-12-25 DIAGNOSIS — M5136 Other intervertebral disc degeneration, lumbar region: Secondary | ICD-10-CM | POA: Diagnosis not present

## 2020-12-25 DIAGNOSIS — M961 Postlaminectomy syndrome, not elsewhere classified: Secondary | ICD-10-CM | POA: Diagnosis not present

## 2020-12-25 DIAGNOSIS — M5412 Radiculopathy, cervical region: Secondary | ICD-10-CM | POA: Diagnosis not present

## 2020-12-25 DIAGNOSIS — M47817 Spondylosis without myelopathy or radiculopathy, lumbosacral region: Secondary | ICD-10-CM | POA: Diagnosis not present

## 2020-12-29 NOTE — Pre-Procedure Instructions (Signed)
Surgical Instructions    Your procedure is scheduled on Wednesday, December 21st.  [facility]" at 6:30 A.M., then check in with the Admitting office.  Call this number if you have problems the morning of surgery:  [phone]   If you have any questions prior to your surgery date call [phone]: Open Monday-Friday 8am-4pm    Remember:  Do not eat or drink after midnight the night before your surgery   Take these medicines the morning of surgery with A SIP OF WATER  buPROPion (WELLBUTRIN XL)  FLUoxetine (PROZAC)  Eye drops morphine (MS CONTIN)  oxyCODONE-acetaminophen (PERCOCET) amLODipine (NORVASC)-if needed  As of today, STOP taking any Aspirin (unless otherwise instructed by your surgeon) Aleve, Naproxen, Ibuprofen, Motrin, Advil, Goody's, BC's, all herbal medications, fish oil, and all vitamins.    After your COVID test   You are not required to quarantine however you are required to wear a well-fitting mask when you are out and around people not in your household.  If your mask becomes wet or soiled, replace with a new one.  Wash your hands often with soap and water for 20 seconds or clean your hands with an alcohol-based hand sanitizer that contains at least 60% alcohol.  Do not share personal items.  Notify your provider: if you are in close contact with someone who has COVID  or if you develop a fever of 100.4 or greater, sneezing, cough, sore throat, shortness of breath or body aches.             Do not wear jewelry. Do not wear lotions, powders, colognes, or deodorant. Do not shave 48 hours prior to surgery.  Men may shave face and neck. Do not bring valuables to the hospital.             [facility] is not responsible for any belongings or valuables.  Do NOT Smoke (Tobacco/Vaping)  24 hours prior to your procedure  If you use a CPAP at night, you may bring your mask for your overnight stay.   Contacts, glasses, hearing aids,  dentures or partials may not be worn into surgery, please bring cases for these belongings   For patients admitted to the hospital, discharge time will be determined by your treatment team.   Patients discharged the day of surgery will not be allowed to drive home, and someone needs to stay with them for 24 hours.  NO VISITORS WILL BE ALLOWED IN PRE-OP WHERE PATIENTS ARE PREPPED FOR SURGERY.  ONLY 1 SUPPORT PERSON MAY BE PRESENT IN THE WAITING ROOM WHILE YOU ARE IN SURGERY.  IF YOU ARE TO BE ADMITTED, ONCE YOU ARE IN YOUR ROOM YOU WILL BE ALLOWED TWO (2) VISITORS. 1 (ONE) VISITOR MAY STAY OVERNIGHT BUT MUST ARRIVE TO THE ROOM BY 8pm.  Minor children may have two parents present. Special consideration for safety and communication needs will be reviewed on a case by case basis.  Special instructions:    Oral Hygiene is also important to reduce your risk of infection.  Remember - BRUSH YOUR TEETH THE MORNING OF SURGERY WITH YOUR REGULAR TOOTHPASTE   - Preparing For Surgery  Before surgery, you can play an important role. Because skin is not sterile, your skin needs to be as free of germs as possible. You can reduce the number of germs on your skin by washing with CHG (chlorahexidine gluconate) Soap before surgery.  CHG is an antiseptic cleaner which kills germs and  bonds with the skin to continue killing germs even after washing.     Please do not use if you have an allergy to CHG or antibacterial soaps. If your skin becomes reddened/irritated stop using the CHG.  Do not shave (including legs and underarms) for at least 48 hours prior to first CHG shower. It is OK to shave your face.  Please follow these instructions carefully.     Shower the NIGHT BEFORE SURGERY and the MORNING OF SURGERY with CHG Soap.   If you chose to wash your hair, wash your hair first as usual with your normal shampoo. After you shampoo, rinse your hair and body thoroughly to remove the shampoo.  Then [employer] and genitals (private parts) with your normal soap and rinse thoroughly to remove soap.  After that Use CHG Soap as you would any other liquid soap. You can apply CHG directly to the skin and wash gently with a scrungie or a clean washcloth.   Apply the CHG Soap to your body ONLY FROM THE NECK DOWN.  Do not use on open wounds or open sores. Avoid contact with your eyes, ears, mouth and genitals (private parts). Wash Face and genitals (private parts)  with your normal soap.   Wash thoroughly, paying special attention to the area where your surgery will be performed.  Thoroughly rinse your body with warm water from the neck down.  DO NOT shower/wash with your normal soap after using and rinsing off the CHG Soap.  Pat yourself dry with a CLEAN TOWEL.  Wear CLEAN PAJAMAS to bed the night before surgery  Place CLEAN SHEETS on your bed the night before your surgery  DO NOT SLEEP WITH PETS.   Day of Surgery:  Take a shower with CHG soap. Wear Clean/Comfortable clothing the morning of surgery Do not apply any deodorants/lotions.   Remember to brush your teeth WITH YOUR REGULAR TOOTHPASTE.   Please read over the following fact sheets that you were given.

## 2021-01-01 ENCOUNTER — Ambulatory Visit (HOSPITAL_COMMUNITY)
Admission: RE | Admit: 2021-01-01 | Discharge: 2021-01-01 | Disposition: A | Discharge status: Home / Self Care | Payer: Medicare HMO | Source: Ambulatory Visit | Attending: Neurological Surgery | Admitting: Neurological Surgery

## 2021-01-01 ENCOUNTER — Other Ambulatory Visit: Payer: Self-pay

## 2021-01-01 ENCOUNTER — Encounter (HOSPITAL_COMMUNITY): Payer: Self-pay

## 2021-01-01 VITALS — BP 145/96 | HR 84 | Temp 98.4°F | Resp 18 | Ht 68.0 in | Wt 165.5 lb

## 2021-01-01 DIAGNOSIS — Z1211 Encounter for screening for malignant neoplasm of colon: Secondary | ICD-10-CM | POA: Diagnosis not present

## 2021-01-01 DIAGNOSIS — K640 First degree hemorrhoids: Secondary | ICD-10-CM | POA: Diagnosis not present

## 2021-01-01 DIAGNOSIS — R151 Fecal smearing: Secondary | ICD-10-CM | POA: Diagnosis not present

## 2021-01-01 DIAGNOSIS — R69 Illness, unspecified: Secondary | ICD-10-CM | POA: Diagnosis not present

## 2021-01-01 DIAGNOSIS — Z01818 Encounter for other preprocedural examination: Secondary | ICD-10-CM

## 2021-01-01 DIAGNOSIS — I10 Essential (primary) hypertension: Secondary | ICD-10-CM | POA: Diagnosis not present

## 2021-01-01 DIAGNOSIS — M960 Pseudarthrosis after fusion or arthrodesis: Secondary | ICD-10-CM | POA: Diagnosis not present

## 2021-01-01 DIAGNOSIS — Z20822 Contact with and (suspected) exposure to covid-19: Secondary | ICD-10-CM | POA: Insufficient documentation

## 2021-01-01 DIAGNOSIS — M5020 Other cervical disc displacement, unspecified cervical region: Secondary | ICD-10-CM | POA: Diagnosis not present

## 2021-01-01 DIAGNOSIS — M532X2 Spinal instabilities, cervical region: Secondary | ICD-10-CM | POA: Diagnosis not present

## 2021-01-01 DIAGNOSIS — N401 Enlarged prostate with lower urinary tract symptoms: Secondary | ICD-10-CM | POA: Diagnosis not present

## 2021-01-01 DIAGNOSIS — M47812 Spondylosis without myelopathy or radiculopathy, cervical region: Secondary | ICD-10-CM | POA: Diagnosis not present

## 2021-01-01 LAB — SARS CORONAVIRUS 2 (TAT 6-24 HRS): SARS Coronavirus 2: NEGATIVE

## 2021-01-01 LAB — SURGICAL PCR SCREEN
MRSA, PCR: NEGATIVE
Staphylococcus aureus: NEGATIVE

## 2021-01-01 LAB — CBC
HCT: 38.7 % — ABNORMAL LOW (ref 39.0–52.0)
Hemoglobin: 12.6 g/dL — ABNORMAL LOW (ref 13.0–17.0)
MCH: 29.1 pg (ref 26.0–34.0)
MCHC: 32.6 g/dL (ref 30.0–36.0)
MCV: 89.4 fL (ref 80.0–100.0)
Platelets: 213 10*3/uL (ref 150–400)
RBC: 4.33 MIL/uL (ref 4.22–5.81)
RDW: 13.7 % (ref 11.5–15.5)
WBC: 5.6 10*3/uL (ref 4.0–10.5)
nRBC: 0 % (ref 0.0–0.2)

## 2021-01-01 LAB — PROTIME-INR
INR: 1 (ref 0.8–1.2)
Prothrombin Time: 13.5 seconds (ref 11.4–15.2)

## 2021-01-01 LAB — TYPE AND SCREEN
ABO/RH(D): O POS
Antibody Screen: NEGATIVE

## 2021-01-01 NOTE — Progress Notes (Signed)
PCP: Nena Polio, MD Cardiologist: denies  EKG: 01/01/21 CXR: na ECHO: denies Stress Test: denies Cardiac Cath: denies  Fasting Blood Sugar- na Checks Blood Sugar_na__ times a day  ASA/Blood Thinner: No  OSA/CPAP: No  Covid test 01/01/21 at PAT  Anesthesia Review: No  Patient denies shortness of breath, fever, cough, and chest pain at PAT appointment.  Patient verbalized understanding of instructions provided today at the PAT appointment.  Patient asked to review instructions at home and day of surgery.

## 2021-01-03 ENCOUNTER — Other Ambulatory Visit: Payer: Self-pay

## 2021-01-03 ENCOUNTER — Inpatient Hospital Stay (HOSPITAL_COMMUNITY): Payer: Medicare HMO

## 2021-01-03 ENCOUNTER — Encounter (HOSPITAL_COMMUNITY): Payer: Self-pay | Admitting: Neurological Surgery

## 2021-01-03 ENCOUNTER — Encounter (HOSPITAL_COMMUNITY)
Admission: RE | Disposition: A | Discharge status: Home / Self Care | Payer: Self-pay | Source: Ambulatory Visit | Attending: Neurological Surgery

## 2021-01-03 ENCOUNTER — Inpatient Hospital Stay (HOSPITAL_COMMUNITY): Payer: Medicare HMO | Admitting: Anesthesiology

## 2021-01-03 ENCOUNTER — Inpatient Hospital Stay (HOSPITAL_COMMUNITY)
Admission: RE | Admit: 2021-01-03 | Discharge: 2021-01-04 | DRG: 472 | Disposition: A | Discharge status: Home / Self Care | Payer: Medicare HMO | Attending: Neurological Surgery | Admitting: Neurological Surgery

## 2021-01-03 DIAGNOSIS — Z981 Arthrodesis status: Secondary | ICD-10-CM | POA: Diagnosis not present

## 2021-01-03 DIAGNOSIS — M1990 Unspecified osteoarthritis, unspecified site: Secondary | ICD-10-CM | POA: Diagnosis not present

## 2021-01-03 DIAGNOSIS — M4322 Fusion of spine, cervical region: Secondary | ICD-10-CM | POA: Diagnosis not present

## 2021-01-03 DIAGNOSIS — Z8719 Personal history of other diseases of the digestive system: Secondary | ICD-10-CM

## 2021-01-03 DIAGNOSIS — F419 Anxiety disorder, unspecified: Secondary | ICD-10-CM | POA: Diagnosis present

## 2021-01-03 DIAGNOSIS — I10 Essential (primary) hypertension: Secondary | ICD-10-CM | POA: Diagnosis not present

## 2021-01-03 DIAGNOSIS — M960 Pseudarthrosis after fusion or arthrodesis: Secondary | ICD-10-CM | POA: Diagnosis not present

## 2021-01-03 DIAGNOSIS — M4802 Spinal stenosis, cervical region: Secondary | ICD-10-CM | POA: Diagnosis present

## 2021-01-03 DIAGNOSIS — N401 Enlarged prostate with lower urinary tract symptoms: Secondary | ICD-10-CM | POA: Diagnosis present

## 2021-01-03 DIAGNOSIS — M545 Low back pain, unspecified: Secondary | ICD-10-CM | POA: Diagnosis present

## 2021-01-03 DIAGNOSIS — F32A Depression, unspecified: Secondary | ICD-10-CM | POA: Diagnosis present

## 2021-01-03 DIAGNOSIS — Z20822 Contact with and (suspected) exposure to covid-19: Secondary | ICD-10-CM | POA: Diagnosis not present

## 2021-01-03 DIAGNOSIS — Z87891 Personal history of nicotine dependence: Secondary | ICD-10-CM | POA: Diagnosis not present

## 2021-01-03 DIAGNOSIS — M5020 Other cervical disc displacement, unspecified cervical region: Secondary | ICD-10-CM | POA: Diagnosis not present

## 2021-01-03 DIAGNOSIS — Z79899 Other long term (current) drug therapy: Secondary | ICD-10-CM | POA: Diagnosis not present

## 2021-01-03 DIAGNOSIS — Z813 Family history of other psychoactive substance abuse and dependence: Secondary | ICD-10-CM

## 2021-01-03 DIAGNOSIS — F1120 Opioid dependence, uncomplicated: Secondary | ICD-10-CM | POA: Diagnosis present

## 2021-01-03 DIAGNOSIS — M47812 Spondylosis without myelopathy or radiculopathy, cervical region: Secondary | ICD-10-CM | POA: Diagnosis not present

## 2021-01-03 DIAGNOSIS — Z811 Family history of alcohol abuse and dependence: Secondary | ICD-10-CM

## 2021-01-03 DIAGNOSIS — Z8249 Family history of ischemic heart disease and other diseases of the circulatory system: Secondary | ICD-10-CM | POA: Diagnosis not present

## 2021-01-03 DIAGNOSIS — R69 Illness, unspecified: Secondary | ICD-10-CM | POA: Diagnosis not present

## 2021-01-03 DIAGNOSIS — Z419 Encounter for procedure for purposes other than remedying health state, unspecified: Secondary | ICD-10-CM

## 2021-01-03 DIAGNOSIS — G8929 Other chronic pain: Secondary | ICD-10-CM | POA: Diagnosis not present

## 2021-01-03 DIAGNOSIS — R7303 Prediabetes: Secondary | ICD-10-CM | POA: Diagnosis not present

## 2021-01-03 DIAGNOSIS — M532X2 Spinal instabilities, cervical region: Secondary | ICD-10-CM | POA: Diagnosis present

## 2021-01-03 HISTORY — PX: POSTERIOR CERVICAL FUSION/FORAMINOTOMY: SHX5038

## 2021-01-03 HISTORY — DX: Prediabetes: R73.03

## 2021-01-03 LAB — BASIC METABOLIC PANEL
Anion gap: 6 (ref 5–15)
BUN: 14 mg/dL (ref 6–20)
CO2: 26 mmol/L (ref 22–32)
Calcium: 9 mg/dL (ref 8.9–10.3)
Chloride: 104 mmol/L (ref 98–111)
Creatinine, Ser: 1.17 mg/dL (ref 0.61–1.24)
GFR, Estimated: >60 mL/min (ref >60)
Glucose, Bld: 104 mg/dL — ABNORMAL HIGH (ref 70–99)
Potassium: 3.3 mmol/L — ABNORMAL LOW (ref 3.5–5.1)
Sodium: 136 mmol/L (ref 135–145)

## 2021-01-03 LAB — GLUCOSE, CAPILLARY: Glucose-Capillary: 115 mg/dL — ABNORMAL HIGH (ref 70–99)

## 2021-01-03 SURGERY — POSTERIOR CERVICAL FUSION/FORAMINOTOMY LEVEL 3
Anesthesia: General

## 2021-01-03 MED ORDER — CEFAZOLIN SODIUM-DEXTROSE 2-4 GM/100ML-% IV SOLN
2.0000 g | Freq: Three times a day (TID) | INTRAVENOUS | Status: AC
  Administered 2021-01-03 – 2021-01-04 (×2): 2 g via INTRAVENOUS
  Filled 2021-01-03 (×2): qty 100

## 2021-01-03 MED ORDER — DEXMEDETOMIDINE (PRECEDEX) IN NS 20 MCG/5ML (4 MCG/ML) IV SYRINGE
PREFILLED_SYRINGE | INTRAVENOUS | Status: DC | PRN
  Administered 2021-01-03: 8 ug via INTRAVENOUS
  Administered 2021-01-03: 12 ug via INTRAVENOUS

## 2021-01-03 MED ORDER — ONDANSETRON HCL 4 MG/2ML IJ SOLN
INTRAMUSCULAR | Status: DC | PRN
  Administered 2021-01-03: 4 mg via INTRAVENOUS

## 2021-01-03 MED ORDER — PROPOFOL 10 MG/ML IV BOLUS
INTRAVENOUS | Status: AC
  Filled 2021-01-03: qty 20

## 2021-01-03 MED ORDER — BUPIVACAINE HCL (PF) 0.25 % IJ SOLN
INTRAMUSCULAR | Status: DC | PRN
  Administered 2021-01-03 (×2): 10 mL

## 2021-01-03 MED ORDER — LISINOPRIL 20 MG PO TABS
20.0000 mg | ORAL_TABLET | Freq: Every day | ORAL | Status: DC
  Administered 2021-01-04: 09:00:00 20 mg via ORAL
  Filled 2021-01-03: qty 1

## 2021-01-03 MED ORDER — HYDROMORPHONE HCL 1 MG/ML IJ SOLN
INTRAMUSCULAR | Status: DC | PRN
  Administered 2021-01-03: .5 mg via INTRAVENOUS

## 2021-01-03 MED ORDER — ONDANSETRON HCL 4 MG/2ML IJ SOLN: 4.0000 mg | Freq: Four times a day (QID) | INTRAMUSCULAR | Status: DC | PRN

## 2021-01-03 MED ORDER — OXYCODONE-ACETAMINOPHEN 10-325 MG PO TABS: 1.0000 | ORAL_TABLET | ORAL | Status: DC | PRN

## 2021-01-03 MED ORDER — SUGAMMADEX SODIUM 200 MG/2ML IV SOLN
INTRAVENOUS | Status: DC | PRN
  Administered 2021-01-03: 200 mg via INTRAVENOUS

## 2021-01-03 MED ORDER — ONDANSETRON HCL 4 MG PO TABS: 4.0000 mg | ORAL_TABLET | Freq: Four times a day (QID) | ORAL | Status: DC | PRN

## 2021-01-03 MED ORDER — CHLORHEXIDINE GLUCONATE CLOTH 2 % EX PADS: 6.0000 | MEDICATED_PAD | Freq: Once | CUTANEOUS | Status: DC

## 2021-01-03 MED ORDER — THROMBIN 5000 UNITS EX SOLR
CUTANEOUS | Status: AC
  Filled 2021-01-03: qty 5000

## 2021-01-03 MED ORDER — LISINOPRIL 20 MG PO TABS
20.0000 mg | ORAL_TABLET | ORAL | Status: AC
  Administered 2021-01-03: 21:00:00 20 mg via ORAL
  Filled 2021-01-03: qty 1

## 2021-01-03 MED ORDER — DEXAMETHASONE SODIUM PHOSPHATE 10 MG/ML IJ SOLN
10.0000 mg | Freq: Once | INTRAMUSCULAR | Status: DC
  Filled 2021-01-03: qty 1

## 2021-01-03 MED ORDER — KETOROLAC TROMETHAMINE 30 MG/ML IJ SOLN
30.0000 mg | Freq: Once | INTRAMUSCULAR | Status: AC | PRN
  Administered 2021-01-03: 11:00:00 30 mg via INTRAVENOUS

## 2021-01-03 MED ORDER — THROMBIN 20000 UNITS EX SOLR
CUTANEOUS | Status: AC
  Filled 2021-01-03: qty 20000

## 2021-01-03 MED ORDER — MIDAZOLAM HCL 2 MG/2ML IJ SOLN
INTRAMUSCULAR | Status: AC
  Filled 2021-01-03: qty 2

## 2021-01-03 MED ORDER — HYDROCHLOROTHIAZIDE 25 MG PO TABS
25.0000 mg | ORAL_TABLET | Freq: Every day | ORAL | Status: DC
  Administered 2021-01-04: 09:00:00 25 mg via ORAL
  Filled 2021-01-03: qty 1

## 2021-01-03 MED ORDER — MENTHOL 3 MG MT LOZG: 1.0000 | LOZENGE | OROMUCOSAL | Status: DC | PRN

## 2021-01-03 MED ORDER — OXYCODONE HCL 5 MG PO TABS: 5.0000 mg | ORAL_TABLET | Freq: Once | ORAL | Status: DC | PRN

## 2021-01-03 MED ORDER — ACETAMINOPHEN 650 MG RE SUPP: 650.0000 mg | RECTAL | Status: DC | PRN

## 2021-01-03 MED ORDER — PROMETHAZINE HCL 25 MG/ML IJ SOLN: 6.2500 mg | INTRAMUSCULAR | Status: DC | PRN

## 2021-01-03 MED ORDER — LACTATED RINGERS IV SOLN: INTRAVENOUS | Status: DC

## 2021-01-03 MED ORDER — MIDAZOLAM HCL 5 MG/5ML IJ SOLN
INTRAMUSCULAR | Status: DC | PRN
  Administered 2021-01-03: 2 mg via INTRAVENOUS

## 2021-01-03 MED ORDER — PHENOL 1.4 % MT LIQD: 1.0000 | OROMUCOSAL | Status: DC | PRN

## 2021-01-03 MED ORDER — HYDROMORPHONE HCL 1 MG/ML IJ SOLN
0.2500 mg | INTRAMUSCULAR | Status: DC | PRN
  Administered 2021-01-03: 12:00:00 0.5 mg via INTRAVENOUS
  Administered 2021-01-03: 11:00:00 0.25 mg via INTRAVENOUS
  Administered 2021-01-03: 11:00:00 0.5 mg via INTRAVENOUS
  Administered 2021-01-03: 12:00:00 0.25 mg via INTRAVENOUS

## 2021-01-03 MED ORDER — KETOROLAC TROMETHAMINE 30 MG/ML IJ SOLN
INTRAMUSCULAR | Status: AC
  Filled 2021-01-03: qty 1

## 2021-01-03 MED ORDER — SODIUM CHLORIDE 0.9% FLUSH
3.0000 mL | Freq: Two times a day (BID) | INTRAVENOUS | Status: DC
  Administered 2021-01-03: 17:00:00 3 mL via INTRAVENOUS

## 2021-01-03 MED ORDER — FENTANYL CITRATE (PF) 250 MCG/5ML IJ SOLN
INTRAMUSCULAR | Status: AC
  Filled 2021-01-03: qty 5

## 2021-01-03 MED ORDER — ARIPIPRAZOLE 5 MG PO TABS
5.0000 mg | ORAL_TABLET | Freq: Every day | ORAL | Status: DC
  Administered 2021-01-03: 21:00:00 5 mg via ORAL
  Filled 2021-01-03 (×2): qty 1

## 2021-01-03 MED ORDER — HYDROMORPHONE HCL 1 MG/ML IJ SOLN
INTRAMUSCULAR | Status: AC
  Filled 2021-01-03: qty 1

## 2021-01-03 MED ORDER — HYDROMORPHONE HCL 1 MG/ML IJ SOLN
INTRAMUSCULAR | Status: AC
  Filled 2021-01-03: qty 0.5

## 2021-01-03 MED ORDER — DEXAMETHASONE SODIUM PHOSPHATE 10 MG/ML IJ SOLN
INTRAMUSCULAR | Status: DC | PRN
  Administered 2021-01-03: 10 mg via INTRAVENOUS

## 2021-01-03 MED ORDER — THROMBIN 5000 UNITS EX SOLR
OROMUCOSAL | Status: DC | PRN
  Administered 2021-01-03: 10:00:00 5 mL via TOPICAL

## 2021-01-03 MED ORDER — SODIUM CHLORIDE 0.9% FLUSH: 3.0000 mL | INTRAVENOUS | Status: DC | PRN

## 2021-01-03 MED ORDER — GABAPENTIN 300 MG PO CAPS
300.0000 mg | ORAL_CAPSULE | ORAL | Status: AC
  Administered 2021-01-03: 07:00:00 300 mg via ORAL
  Filled 2021-01-03: qty 1

## 2021-01-03 MED ORDER — ACETAMINOPHEN 500 MG PO TABS
1000.0000 mg | ORAL_TABLET | ORAL | Status: AC
  Administered 2021-01-03: 07:00:00 1000 mg via ORAL
  Filled 2021-01-03: qty 2

## 2021-01-03 MED ORDER — AMLODIPINE BESYLATE 5 MG PO TABS: 5.0000 mg | ORAL_TABLET | Freq: Every day | ORAL | Status: DC | PRN

## 2021-01-03 MED ORDER — AMISULPRIDE (ANTIEMETIC) 5 MG/2ML IV SOLN: 10.0000 mg | Freq: Once | INTRAVENOUS | Status: DC | PRN

## 2021-01-03 MED ORDER — FLUOXETINE HCL 20 MG PO CAPS
20.0000 mg | ORAL_CAPSULE | Freq: Every day | ORAL | Status: DC
  Administered 2021-01-04: 09:00:00 20 mg via ORAL
  Filled 2021-01-03: qty 1

## 2021-01-03 MED ORDER — ORAL CARE MOUTH RINSE: 15.0000 mL | Freq: Once | OROMUCOSAL | Status: AC

## 2021-01-03 MED ORDER — VANCOMYCIN HCL 1000 MG IV SOLR
INTRAVENOUS | Status: AC
  Filled 2021-01-03: qty 20

## 2021-01-03 MED ORDER — METHOCARBAMOL 1000 MG/10ML IJ SOLN
500.0000 mg | Freq: Four times a day (QID) | INTRAVENOUS | Status: DC | PRN
  Filled 2021-01-03: qty 5

## 2021-01-03 MED ORDER — ACETAMINOPHEN 325 MG PO TABS
650.0000 mg | ORAL_TABLET | ORAL | Status: DC | PRN
  Administered 2021-01-03: 23:00:00 325 mg via ORAL
  Filled 2021-01-03 (×2): qty 2

## 2021-01-03 MED ORDER — METHOCARBAMOL 500 MG PO TABS
500.0000 mg | ORAL_TABLET | Freq: Four times a day (QID) | ORAL | Status: DC | PRN
  Administered 2021-01-03 – 2021-01-04 (×3): 500 mg via ORAL
  Filled 2021-01-03 (×3): qty 1

## 2021-01-03 MED ORDER — KETAMINE HCL 10 MG/ML IJ SOLN
INTRAMUSCULAR | Status: DC | PRN
  Administered 2021-01-03: 10 mg via INTRAVENOUS
  Administered 2021-01-03: 30 mg via INTRAVENOUS
  Administered 2021-01-03: 10 mg via INTRAVENOUS

## 2021-01-03 MED ORDER — OXYCODONE HCL 5 MG/5ML PO SOLN: 5.0000 mg | Freq: Once | ORAL | Status: DC | PRN

## 2021-01-03 MED ORDER — POTASSIUM CHLORIDE IN NACL 20-0.9 MEQ/L-% IV SOLN: INTRAVENOUS | Status: DC

## 2021-01-03 MED ORDER — FENTANYL CITRATE (PF) 250 MCG/5ML IJ SOLN
INTRAMUSCULAR | Status: DC | PRN
  Administered 2021-01-03 (×5): 50 ug via INTRAVENOUS

## 2021-01-03 MED ORDER — THROMBIN 20000 UNITS EX SOLR
CUTANEOUS | Status: DC | PRN
  Administered 2021-01-03: 10:00:00 20 mL via TOPICAL

## 2021-01-03 MED ORDER — MORPHINE SULFATE ER 15 MG PO TBCR
15.0000 mg | EXTENDED_RELEASE_TABLET | Freq: Two times a day (BID) | ORAL | Status: DC
  Administered 2021-01-03 – 2021-01-04 (×2): 15 mg via ORAL
  Filled 2021-01-03 (×2): qty 1

## 2021-01-03 MED ORDER — KETAMINE HCL 50 MG/5ML IJ SOSY
PREFILLED_SYRINGE | INTRAMUSCULAR | Status: AC
  Filled 2021-01-03: qty 5

## 2021-01-03 MED ORDER — 0.9 % SODIUM CHLORIDE (POUR BTL) OPTIME
TOPICAL | Status: DC | PRN
  Administered 2021-01-03: 09:00:00 1000 mL

## 2021-01-03 MED ORDER — BACITRACIN ZINC 500 UNIT/GM EX OINT
TOPICAL_OINTMENT | CUTANEOUS | Status: AC
  Filled 2021-01-03: qty 28.35

## 2021-01-03 MED ORDER — CHLORHEXIDINE GLUCONATE 0.12 % MT SOLN
15.0000 mL | Freq: Once | OROMUCOSAL | Status: AC
  Administered 2021-01-03: 07:00:00 15 mL via OROMUCOSAL

## 2021-01-03 MED ORDER — OXYCODONE-ACETAMINOPHEN 5-325 MG PO TABS
1.0000 | ORAL_TABLET | ORAL | Status: DC | PRN
  Administered 2021-01-03 – 2021-01-04 (×6): 1 via ORAL
  Filled 2021-01-03 (×6): qty 1

## 2021-01-03 MED ORDER — CHLORHEXIDINE GLUCONATE 0.12 % MT SOLN
OROMUCOSAL | Status: AC
  Filled 2021-01-03: qty 15

## 2021-01-03 MED ORDER — MORPHINE SULFATE (PF) 2 MG/ML IV SOLN: 2.0000 mg | INTRAVENOUS | Status: DC | PRN

## 2021-01-03 MED ORDER — SENNA 8.6 MG PO TABS
1.0000 | ORAL_TABLET | Freq: Two times a day (BID) | ORAL | Status: DC
  Administered 2021-01-03 – 2021-01-04 (×2): 8.6 mg via ORAL
  Filled 2021-01-03 (×2): qty 1

## 2021-01-03 MED ORDER — CEFAZOLIN SODIUM-DEXTROSE 2-4 GM/100ML-% IV SOLN
2.0000 g | INTRAVENOUS | Status: AC
  Administered 2021-01-03: 09:00:00 2 g via INTRAVENOUS
  Filled 2021-01-03: qty 100

## 2021-01-03 MED ORDER — BUPIVACAINE HCL (PF) 0.25 % IJ SOLN
INTRAMUSCULAR | Status: AC
  Filled 2021-01-03: qty 30

## 2021-01-03 MED ORDER — CELECOXIB 200 MG PO CAPS
200.0000 mg | ORAL_CAPSULE | Freq: Two times a day (BID) | ORAL | Status: DC
  Administered 2021-01-03 – 2021-01-04 (×3): 200 mg via ORAL
  Filled 2021-01-03 (×3): qty 1

## 2021-01-03 MED ORDER — BACITRACIN ZINC 500 UNIT/GM EX OINT
TOPICAL_OINTMENT | CUTANEOUS | Status: DC | PRN
  Administered 2021-01-03: 1 via TOPICAL

## 2021-01-03 MED ORDER — ROCURONIUM BROMIDE 10 MG/ML (PF) SYRINGE
PREFILLED_SYRINGE | INTRAVENOUS | Status: DC | PRN
  Administered 2021-01-03: 100 mg via INTRAVENOUS

## 2021-01-03 MED ORDER — SODIUM CHLORIDE 0.9 % IV SOLN: 250.0000 mL | INTRAVENOUS | Status: DC

## 2021-01-03 MED ORDER — PROPOFOL 10 MG/ML IV BOLUS
INTRAVENOUS | Status: DC | PRN
  Administered 2021-01-03: 100 mg via INTRAVENOUS

## 2021-01-03 MED ORDER — LIDOCAINE 2% (20 MG/ML) 5 ML SYRINGE
INTRAMUSCULAR | Status: DC | PRN
  Administered 2021-01-03: 60 mg via INTRAVENOUS

## 2021-01-03 MED ORDER — OXYCODONE HCL 5 MG PO TABS
5.0000 mg | ORAL_TABLET | ORAL | Status: DC | PRN
  Administered 2021-01-03 – 2021-01-04 (×6): 5 mg via ORAL
  Filled 2021-01-03 (×6): qty 1

## 2021-01-03 MED ORDER — LISINOPRIL-HYDROCHLOROTHIAZIDE 20-25 MG PO TABS: 1.0000 | ORAL_TABLET | Freq: Every day | ORAL | Status: DC

## 2021-01-03 MED ORDER — BUPROPION HCL ER (XL) 300 MG PO TB24
300.0000 mg | ORAL_TABLET | Freq: Every day | ORAL | Status: DC
  Administered 2021-01-04: 09:00:00 300 mg via ORAL
  Filled 2021-01-03: qty 1

## 2021-01-03 SURGICAL SUPPLY — 57 items
ADH SKN CLS APL DERMABOND .7 (GAUZE/BANDAGES/DRESSINGS) ×1
APL SKNCLS STERI-STRIP NONHPOA (GAUZE/BANDAGES/DRESSINGS) ×1
BAG COUNTER SPONGE SURGICOUNT (BAG) ×3 IMPLANT
BAG SPNG CNTER NS LX DISP (BAG) ×2
BAG SURGICOUNT SPONGE COUNTING (BAG) ×2
BAND INSRT 18 STRL LF DISP RB (MISCELLANEOUS)
BAND RUBBER #18 3X1/16 STRL (MISCELLANEOUS) IMPLANT
BENZOIN TINCTURE PRP APPL 2/3 (GAUZE/BANDAGES/DRESSINGS) ×4 IMPLANT
BIT DRILL INVICTUS SUB 2.1 STR (BIT) ×2 IMPLANT
BLADE CLIPPER SURG (BLADE) IMPLANT
BUR CARBIDE MATCH 3.0 (BURR) ×2 IMPLANT
CANISTER SUCT 3000ML PPV (MISCELLANEOUS) ×3 IMPLANT
CLOSURE WOUND 1/2 X4 (GAUZE/BANDAGES/DRESSINGS) ×1
DERMABOND ADVANCED (GAUZE/BANDAGES/DRESSINGS) ×2
DERMABOND ADVANCED .7 DNX12 (GAUZE/BANDAGES/DRESSINGS) IMPLANT
DRAPE C-ARM 42X72 X-RAY (DRAPES) ×6 IMPLANT
DRAPE LAPAROTOMY 100X72 PEDS (DRAPES) ×3 IMPLANT
DRAPE MICROSCOPE LEICA (MISCELLANEOUS) IMPLANT
DRSG OPSITE POSTOP 4X6 (GAUZE/BANDAGES/DRESSINGS) ×2 IMPLANT
DURAPREP 26ML APPLICATOR (WOUND CARE) ×3 IMPLANT
ELECT REM PT RETURN 9FT ADLT (ELECTROSURGICAL) ×3
ELECTRODE REM PT RTRN 9FT ADLT (ELECTROSURGICAL) ×1 IMPLANT
EVACUATOR 1/8 PVC DRAIN (DRAIN) IMPLANT
GAUZE 4X4 16PLY ~~LOC~~+RFID DBL (SPONGE) ×2 IMPLANT
GAUZE SPONGE 4X4 12PLY STRL (GAUZE/BANDAGES/DRESSINGS) ×1 IMPLANT
GLOVE SURG ENC MOIS LTX SZ7 (GLOVE) ×2 IMPLANT
GLOVE SURG ENC MOIS LTX SZ8 (GLOVE) ×3 IMPLANT
GLOVE SURG UNDER POLY LF SZ7 (GLOVE) ×2 IMPLANT
GOWN STRL REUS W/ TWL LRG LVL3 (GOWN DISPOSABLE) IMPLANT
GOWN STRL REUS W/ TWL XL LVL3 (GOWN DISPOSABLE) ×1 IMPLANT
GOWN STRL REUS W/TWL 2XL LVL3 (GOWN DISPOSABLE) IMPLANT
GOWN STRL REUS W/TWL LRG LVL3 (GOWN DISPOSABLE) ×9
GOWN STRL REUS W/TWL XL LVL3 (GOWN DISPOSABLE) ×3
HEMOSTAT POWDER KIT SURGIFOAM (HEMOSTASIS) ×2 IMPLANT
KIT BASIN OR (CUSTOM PROCEDURE TRAY) ×3 IMPLANT
KIT TURNOVER KIT B (KITS) ×3 IMPLANT
NDL SPNL 20GX3.5 QUINCKE YW (NEEDLE) IMPLANT
NEEDLE HYPO 22GX1.5 SAFETY (NEEDLE) ×3 IMPLANT
NEEDLE SPNL 20GX3.5 QUINCKE YW (NEEDLE) IMPLANT
NS IRRIG 1000ML POUR BTL (IV SOLUTION) ×3 IMPLANT
PACK LAMINECTOMY NEURO (CUSTOM PROCEDURE TRAY) ×3 IMPLANT
PAD ARMBOARD 7.5X6 YLW CONV (MISCELLANEOUS) ×1 IMPLANT
PIN MAYFIELD SKULL DISP (PIN) ×3 IMPLANT
PUTTY DBM 10CC (Putty) ×2 IMPLANT
ROD LORD INV 3.5X70 (Rod) ×4 IMPLANT
SCREW POLYAXIAL 3.5 X 14 (Screw) ×20 IMPLANT
SCREW SET ATEC (Screw) ×20 IMPLANT
SPONGE SURGIFOAM ABS GEL 100 (HEMOSTASIS) ×3 IMPLANT
SPONGE T-LAP 4X18 ~~LOC~~+RFID (SPONGE) ×2 IMPLANT
STRIP CLOSURE SKIN 1/2X4 (GAUZE/BANDAGES/DRESSINGS) ×2 IMPLANT
SUT VIC AB 0 CT1 18XCR BRD8 (SUTURE) ×1 IMPLANT
SUT VIC AB 0 CT1 8-18 (SUTURE) ×3
SUT VIC AB 2-0 CP2 18 (SUTURE) ×3 IMPLANT
SUT VIC AB 3-0 SH 8-18 (SUTURE) ×5 IMPLANT
TOWEL GREEN STERILE (TOWEL DISPOSABLE) ×3 IMPLANT
TOWEL GREEN STERILE FF (TOWEL DISPOSABLE) ×3 IMPLANT
WATER STERILE IRR 1000ML POUR (IV SOLUTION) ×3 IMPLANT

## 2021-01-03 NOTE — Anesthesia Preprocedure Evaluation (Addendum)
Anesthesia Evaluation  Patient identified by MRN, date of birth, ID band Patient awake    Reviewed: Allergy & Precautions, NPO status , Patient's Chart, lab work & pertinent test results  Airway Mallampati: III  TM Distance: >3 FB Neck ROM: Limited    Dental  (+) Missing, Dental Advisory Given   Pulmonary Patient abstained from smoking., former smoker,  Former smoker, 18 pack year history- quit smoking July 2022   Pulmonary exam normal breath sounds clear to auscultation       Cardiovascular hypertension, Pt. on medications Normal cardiovascular exam Rhythm:Regular Rate:Normal     Neuro/Psych PSYCHIATRIC DISORDERS Anxiety Depression    GI/Hepatic negative GI ROS, Neg liver ROS,   Endo/Other  negative endocrine ROS  Renal/GU negative Renal ROS  negative genitourinary   Musculoskeletal  (+) Arthritis , Osteoarthritis,    Abdominal   Peds  Hematology negative hematology ROS (+) H/H 12.6/38.7, plt 213   Anesthesia Other Findings Opiate dependance- morphine 15mg  BID, percocet 10/325 QID for many years mostly 2/2 back pain  Reproductive/Obstetrics negative OB ROS                            Anesthesia Physical Anesthesia Plan  ASA: 3  Anesthesia Plan: General   Post-op Pain Management: Tylenol PO (pre-op), Dilaudid IV and Ketamine IV   Induction: Intravenous  PONV Risk Score and Plan: 2 and Ondansetron, Dexamethasone, Midazolam and Treatment may vary due to age or medical condition  Airway Management Planned: Oral ETT and Video Laryngoscope Planned  Additional Equipment: None  Intra-op Plan:   Post-operative Plan: Extubation in OR  Informed Consent: I have reviewed the patients History and Physical, chart, labs and discussed the procedure including the risks, benefits and alternatives for the proposed anesthesia with the patient or authorized representative who has indicated his/her  understanding and acceptance.     Dental advisory given  Plan Discussed with: CRNA  Anesthesia Plan Comments:        Anesthesia Quick Evaluation

## 2021-01-03 NOTE — Progress Notes (Signed)
Dr. Doroteo Glassman made aware of patient's BP readings this morning. 155/101, 148/99, and 151/98. No new orders received.

## 2021-01-03 NOTE — Anesthesia Procedure Notes (Signed)
Procedure Name: Intubation Date/Time: 01/03/2021 8:52 AM Performed by: Griffin Dakin, CRNA Pre-anesthesia Checklist: Patient identified, Emergency Drugs available, Suction available and Patient being monitored Patient Re-evaluated:Patient Re-evaluated prior to induction Oxygen Delivery Method: Circle system utilized Preoxygenation: Pre-oxygenation with 100% oxygen Induction Type: IV induction Ventilation: Mask ventilation without difficulty Laryngoscope Size: Glidescope and 4 Grade View: Grade I Tube type: Oral Tube size: 7.5 mm Number of attempts: 1 Airway Equipment and Method: Stylet and Oral airway Placement Confirmation: ETT inserted through vocal cords under direct vision, positive ETCO2 and breath sounds checked- equal and bilateral Secured at: 24 cm Tube secured with: Tape Dental Injury: Teeth and Oropharynx as per pre-operative assessment

## 2021-01-03 NOTE — Op Note (Signed)
01/03/2021  10:38 AM  PATIENT:  Benjamin Solomon  55 y.o. male  PRE-OPERATIVE DIAGNOSIS: Cervical spondylosis C3-4, cervical pseudoarthrosis C5-6, cervical spondylosis C6-7  POST-OPERATIVE DIAGNOSIS:  same, with cervical instability C3-4 and C6-7  PROCEDURE:  1.  Posterior cervical arthrodesis C3-C7 utilizing DBM putty, 2.  Segmental lateral mass fixation C3-C7 utilizing Alphatec lateral mass screws  SURGEON:  Sherley Bounds, MD  ASSISTANTS: Glenford Peers FNP  ANESTHESIA:   General  EBL: Less than 50 ml  Total I/O In: 1000 [I.V.:1000] Out: -   BLOOD ADMINISTERED: none  DRAINS: None  SPECIMEN:  none  INDICATION FOR PROCEDURE: This patient presented with neck pain with right arm pain. Imaging showed cervical spondylosis at C3-4 and C6-7 with worsening left foraminal stenosis at C6-7, and a pseudoarthrosis at C5-6. The patient tried conservative measures without relief. Pain was debilitating. Recommended posterior cervical fusion C3-C6.  During surgery found the facet joints at C3-4 and C6-7 to be hypermobile and felt there was instability at those 2 levels.  With worsening foraminal stenosis at C6-7 we felt it was best to extend our fusion to C7.  We felt this would reduce the risk of needing further surgery at the C6-7 level in the future.  Patient understood the risks, benefits, and alternatives and potential outcomes and wished to proceed.  PROCEDURE DETAILS: The patient was brought to the operating room. Generalized endotracheal anesthesia was induced. The patient was affixed a 3 point [place] headrest and rolled into the prone position on chest rolls. All pressure points were padded. The posterior cervical region was cleaned with a Betadine scrub and prepped with DuraPrep and then draped in the usual sterile fashion. 7 cc of local anesthesia was injected and a dorsal midline incision made in the posterior cervical region and carried down to the cervical fascia. The fascia was  opened and the paraspinous musculature was taken down to expose C3-C7.  Joints at C3-4 and C6-7 appeared to be hypermobile to [place] during the exposure.  The MRI suggested worsening foraminal stenosis at C6-7 on serial imaging.  Therefore we decided it was best to extend our fusion down to C7.  Intraoperative fluoroscopy confirmed my level and then the dissection was carried out over the lateral facets. I localized the midpoint of each lateral mass and marked a region 1 mm medial to the midpoint of the lateral mass, and then drilled in an upward and outward direction into the safe zone of each lateral mass. I drilled to a depth of 14 mm and then checked my drill hole with a ball probe. I then placed a 14 mm lateral mass screws into the safe zone of each lateral mass until they were 2 fingers tight. I then decorticated the lateral masses and the facet joints and packed them with  morcellized allograft to perform arthrodesis from C3-C7. I then placed rods into the multiaxial screw heads of the screws and locked these into position with the locking caps and anti-torque device. I then checked the final construct with AP/Lat fluoroscopy. I irrigated with saline solution containing bacitracin.  After hemostasis was achieved I closed the muscle and the fascia with 0 Vicryl, subcutaneous tissue with 2-0 Vicryl, and the subcuticular tissue with 3-0 Vicryl. The skin was closed with benzoin and Steri-Strips. A sterile dressing was applied, the patient was turned to the supine position and taken out of the headrest, awakened from general anesthesia and transferred to the recovery room in stable condition. At the end of the  procedure all sponge, needle and instrument counts were correct.  My nurse practitioner was present for the entire case and involved in the exposure, the placement of the lateral mass screws, the fusion, and the closure.    PLAN OF CARE: Admit for overnight observation  PATIENT DISPOSITION:  PACU -  hemodynamically stable.   Delay start of Pharmacological VTE agent (>24hrs) due to surgical blood loss or risk of bleeding:  yes

## 2021-01-03 NOTE — Transfer of Care (Signed)
Immediate Anesthesia Transfer of Care Note  Patient: Benjamin Solomon  Procedure(s) Performed: Posterior cervical fusion with lateral mass fixation Cervical Three - Cervical seven  Patient Location: PACU  Anesthesia Type:General  Level of Consciousness: awake, alert  and oriented  Airway & Oxygen Therapy: Patient Spontanous Breathing and Patient connected to face mask oxygen  Post-op Assessment: Report given to RN and Post -op Vital signs reviewed and stable  Post vital signs: Reviewed and stable  Last Vitals:  Vitals Value Taken Time  BP 142/98 01/03/21 1045  Temp 36.5 C 01/03/21 1045  Pulse 82 01/03/21 1052  Resp 15 01/03/21 1052  SpO2 91 % 01/03/21 1052  Vitals shown include unvalidated device data.  Last Pain:  Vitals:   01/03/21 0653  TempSrc:   PainSc: 7       Patients Stated Pain Goal: 2 (09/47/09 6283)  Complications: No notable events documented.

## 2021-01-03 NOTE — Anesthesia Postprocedure Evaluation (Signed)
Anesthesia Post Note  Patient: Benjamin Solomon  Procedure(s) Performed: Posterior cervical fusion with lateral mass fixation Cervical Three - Cervical seven     Patient location during evaluation: PACU Anesthesia Type: General Level of consciousness: awake and alert, oriented and patient cooperative Pain management: pain level controlled Vital Signs Assessment: post-procedure vital signs reviewed and stable Respiratory status: spontaneous breathing, nonlabored ventilation and respiratory function stable Cardiovascular status: blood pressure returned to baseline and stable Postop Assessment: no apparent nausea or vomiting Anesthetic complications: no   No notable events documented.  Last Vitals:  Vitals:   01/03/21 1145 01/03/21 1200  BP: (!) 143/97 (!) 141/97  Pulse: 86 86  Resp: 14 14  Temp:    SpO2: 95% 99%    Last Pain:  Vitals:   01/03/21 1145  TempSrc:   PainSc: [place]

## 2021-01-03 NOTE — H&P (Signed)
Subjective:   Patient is a 55 y.o. male admitted for PCF. The patient first presented to me with complaints of neck pain, shooting pains in the arm(s), and loss of strength of the arm(s). Onset of symptoms was several months ago. The pain is described as aching and occurs all day. The pain is rated moderate, and is located in the neck and radiates to the neck and r arm. The symptoms have been progressive. Symptoms are exacerbated by extending head backwards, and are relieved by none.  Previous work up includes CT of cervical spine, results: pseudoarthrosis.  Past Medical History:  Diagnosis Date   Anxiety    Arthritis    Benign prostatic hyperplasia with lower urinary tract symptoms    Carpal tunnel syndrome, right upper limb    Chronic back pain    Depression    Essential (primary) hypertension    Hemorrhoids    Hyperplastic colon polyp    Hypertension    Low back pain    Major depressive disorder, single episode, unspecified    Opioid dependence ([place])    Pre-diabetes    Tubular adenoma    Unspecified osteoarthritis, unspecified site     Past Surgical History:  Procedure Laterality Date   CARPAL TUNNEL RELEASE Right 02/17/2018   Procedure: CARPAL TUNNEL RELEASE;  Surgeon: Daryll Brod, MD;  Location: [facility];  Service: Orthopedics;  Laterality: Right;   CERVICAL SPINE SURGERY  2009   COLONOSCOPY     EVALUATION UNDER ANESTHESIA WITH HEMORRHOIDECTOMY  12/20/2011   FOOT SURGERY Right 2012   bone removed from the 5 th toe   INCISION AND DRAINAGE ABSCESS N/A 08/04/2020   Procedure: INCISION AND DRAINAGE ABSCESS;  Surgeon: Jason Coop, DO;  Location: [facility];  Service: ENT;  Laterality: N/A;   NASAL ENDOSCOPY N/A 08/04/2020   Procedure: NASAL ENDOSCOPY;  Surgeon: Jason Coop, DO;  Location: [facility];  Service: ENT;  Laterality: N/A;   TOOTH EXTRACTION N/A 08/04/2020   Procedure: DENTAL EXTRACTION TOOTH #11;  Surgeon: Newt Lukes, DMD;  Location: [facility];   Service: Dentistry;  Laterality: N/A;    No Known Allergies  Social History   Tobacco Use   Smoking status: Former    Packs/day: 0.25    Years: 20.00    Pack years: 5.00    Types: Cigarettes    Quit date: 07/25/2020    Years since quitting: 0.4   Smokeless tobacco: Never  Substance Use Topics   Alcohol use: No    Alcohol/week: 0.0 standard drinks    Family History  Problem Relation Age of Onset   Hypertension Mother    Hypertension Father    Alcohol abuse Brother    Drug abuse Brother    Diabetes Neg Hx    Heart disease Neg Hx    Kidney disease Neg Hx    Liver disease Neg Hx    Prior to Admission medications   Medication Sig Start Date End Date Taking? Authorizing Provider  amLODipine (NORVASC) 5 MG tablet Take 5 mg by mouth daily as needed (blood pressure of 150 or higher). 01/27/13  Yes [provider]  ARIPiprazole (ABILIFY) 5 MG tablet Take 5 mg by mouth at bedtime. 03/20/20  Yes [provider]  buPROPion (WELLBUTRIN XL) 300 MG 24 hr tablet Take 300 mg by mouth daily. 05/18/16  Yes [provider]  docusate sodium (COLACE) 100 MG capsule Take 100 mg by mouth daily as needed for moderate constipation.  Yes [provider]  FLUoxetine (PROZAC) 20 MG capsule Take 20 mg by mouth daily. 07/31/20  Yes [provider]  lisinopril-hydrochlorothiazide (ZESTORETIC) 20-25 MG tablet Take 1 tablet by mouth daily. 06/19/20  Yes [provider]  morphine (MS CONTIN) 15 MG 12 hr tablet Take 15 mg by mouth every 12 (twelve) hours. 07/14/20  Yes [provider]  oxyCODONE-acetaminophen (PERCOCET) 10-325 MG tablet Take 1 tablet by mouth every 4 (four) hours as needed for pain. Patient taking differently: Take 1 tablet by mouth in the morning, at noon, in the evening, and at bedtime. 08/08/16  Yes Eustace Moore, MD  ALPRAZolam Duanne Moron) 0.5 MG tablet Take 0.5 mg by mouth at bedtime as needed for sleep.  06/02/16   [provider]   clindamycin (CLEOCIN T) 1 % external solution Apply 1 application topically 2 (two) times daily as needed (boils).  10/13/15   [provider]  clindamycin (CLEOCIN) 300 MG capsule Take 300 mg by mouth 2 (two) times daily as needed (boils).  10/12/15   [provider]  famotidine (PEPCID) 20 MG tablet Take 1 tablet (20 mg total) by mouth 2 (two) times daily for 5 days. Patient not taking: Reported on 12/27/2020 09/12/20 09/17/20  Sponseller, Eugene Garnet R, PA-C  hydroxypropyl methylcellulose / hypromellose (ISOPTO TEARS / GONIOVISC) 2.5 % ophthalmic solution Place 1 drop into both eyes 3 (three) times daily as needed for dry eyes.    [provider]  methylPREDNISolone (MEDROL DOSEPAK) 4 MG TBPK tablet Take 24 mg on day 1, 20 mg on day 2, 16 mg on day 3, 12 mg on day 4, 8 mg on day 5, 4 mg on day 6. Patient not taking: Reported on 12/27/2020 10/15/20   Lynden Oxford Scales, PA-C     Review of Systems  Positive ROS: neg  All other systems have been reviewed and were otherwise negative with the exception of those mentioned in the HPI and as above.  Objective: Vital signs in last 24 hours: Temp:  [98 F (36.7 C)] 98 F (36.7 C) (12/21 0640) Pulse Rate:  [75] 75 (12/21 0640) Resp:  [18] 18 (12/21 0640) BP: (148-155)/(98-101) 151/98 (12/21 0725) SpO2:  [99 %] 99 % (12/21 0640) Weight:  [74.8 kg] 74.8 kg (12/21 0640)  General Appearance: Alert, cooperative, no distress, appears stated age Head: Normocephalic, without obvious abnormality, atraumatic Eyes: PERRL, conjunctiva/corneas clear, EOM's intact      Neck: Supple, symmetrical, trachea midline, Back: Symmetric, no curvature, ROM normal, no CVA tenderness Lungs:  respirations unlabored Heart: Regular rate and rhythm Abdomen: Soft, non-tender Extremities: Extremities normal, atraumatic, no cyanosis or edema Pulses: 2+ and symmetric all extremities Skin: Skin color, texture, turgor normal, no rashes or  lesions  NEUROLOGIC:  Mental status: Alert and oriented x4, no aphasia, good attention span, fund of knowledge and memory  Motor Exam - grossly normal Sensory Exam - grossly normal Reflexes: 1= Coordination - grossly normal Gait - grossly normal Balance - grossly normal Cranial Nerves: I: smell Not tested  II: visual acuity  OS: nl    OD: nl  II: visual fields Full to confrontation  II: pupils Equal, round, reactive to light  III,VII: ptosis None  III,IV,VI: extraocular muscles  Full ROM  V: mastication Normal  V: facial light touch sensation  Normal  V,VII: corneal reflex  Present  VII: facial muscle function - upper  Normal  VII: facial muscle function - lower Normal  VIII: hearing Not tested  IX: soft palate elevation  Normal  IX,X: gag reflex Present  XI: trapezius strength  5/5  XI: sternocleidomastoid strength 5/5  XI: neck flexion strength  5/5  XII: tongue strength  Normal    Data Review Lab Results  Component Value Date   WBC 5.6 01/01/2021   HGB 12.6 (L) 01/01/2021   HCT 38.7 (L) 01/01/2021   MCV 89.4 01/01/2021   PLT 213 01/01/2021   Lab Results  Component Value Date   NA 136 01/03/2021   K 3.3 (L) 01/03/2021   CL 104 01/03/2021   CO2 26 01/03/2021   BUN 14 01/03/2021   CREATININE 1.17 01/03/2021   GLUCOSE 104 (H) 01/03/2021   Lab Results  Component Value Date   INR 1.0 01/01/2021    Assessment:   Cervical neck pain with herniated nucleus pulposus/ spondylosis/ stenosis with pseudoarthrosis. Estimated body mass index is 25.09 kg/m as calculated from the following:   Height as of this encounter: 5\' 8"  (1.727 m).   Weight as of this encounter: 74.8 kg.  Patient has failed conservative therapy. Planned surgery : posterior cervical instrumented fusion C3-6  Plan:   I explained the condition and procedure to the patient and answered any questions.  Patient wishes to proceed with procedure as planned. Understands risks/ benefits/ and expected or  typical outcomes.  KEIYON PLACK 01/03/2021 8:29 AM

## 2021-01-04 ENCOUNTER — Encounter (HOSPITAL_COMMUNITY): Payer: Self-pay | Admitting: Neurological Surgery

## 2021-01-04 MED ORDER — OXYCODONE-ACETAMINOPHEN 10-325 MG PO TABS: 1.0000 | ORAL_TABLET | ORAL | 0 refills | Status: AC | PRN

## 2021-01-04 MED ORDER — METHOCARBAMOL 500 MG PO TABS
500.0000 mg | ORAL_TABLET | Freq: Four times a day (QID) | ORAL | 1 refills | Status: DC | PRN
Start: 2021-01-04 — End: 2022-07-26

## 2021-01-04 MED ORDER — OXYCODONE-ACETAMINOPHEN 10-325 MG PO TABS: 1.0000 | ORAL_TABLET | ORAL | 0 refills | Status: DC | PRN

## 2021-01-04 MED ORDER — CELECOXIB 200 MG PO CAPS: 200.0000 mg | ORAL_CAPSULE | Freq: Two times a day (BID) | ORAL | 0 refills | Status: AC

## 2021-01-04 NOTE — Progress Notes (Signed)
Patient awaiting transport via wheelchair by volunteer for discharge home; in no acute distress nor complaints of pain nor discomfort; incision on his posterior neck with honeycomb dressing was clean, dry and intact; room was checked and accounted for all his belongings; discharge instructions concerning his medications, incision care, follow up appointment and when to call the doctor were all discussed with patient by RN and he verbalized understanding on the instructions given.

## 2021-01-04 NOTE — Discharge Summary (Addendum)
Physician Discharge Summary  Patient ID: Benjamin Solomon MRN: [id number] DOB/AGE: 1965-05-28 55 y.o.  Admit date: 01/03/2021 Discharge date: 01/04/2021  Admission Diagnoses: cervical spondylosis with pseudoarthrosis    Discharge Diagnoses: same   Discharged Condition: good  Hospital Course: The patient was admitted on 01/03/2021 and taken to the operating room where the patient underwent posterior cervical fusion C3-7. The patient tolerated the procedure well and was taken to the recovery room and then to the floor in stable condition. The hospital course was routine. There were no complications. The wound remained clean dry and intact. Pt had appropriate neck soreness. No complaints of arm pain or new N/T/W. The patient remained afebrile with stable vital signs, and tolerated a regular diet. The patient continued to increase activities, and pain was well controlled with oral pain medications.   Consults: None  Significant Diagnostic Studies:  Results for orders placed or performed during the hospital encounter of 02/77/41  Basic metabolic panel  Result Value Ref Range   Sodium 136 135 - 145 mmol/L   Potassium 3.3 (L) 3.5 - 5.1 mmol/L   Chloride 104 98 - 111 mmol/L   CO2 26 22 - 32 mmol/L   Glucose, Bld 104 (H) 70 - 99 mg/dL   BUN 14 6 - 20 mg/dL   Creatinine, Ser 1.17 0.61 - 1.24 mg/dL   Calcium 9.0 8.9 - 10.3 mg/dL   GFR, Estimated >60 >60 mL/min   Anion gap 6 5 - 15  Glucose, capillary  Result Value Ref Range   Glucose-Capillary 115 (H) 70 - 99 mg/dL    DG Cervical Spine 2 or 3 views  Result Date: 01/03/2021 CLINICAL DATA:  Posterior fusion EXAM: CERVICAL SPINE - 2-3 VIEW COMPARISON:  01/18/2020 FINDINGS: Remote anterior fusion at C5-6. Changes of posterior fusion from C3-C7. No hardware complicating feature. IMPRESSION: Posterior fusion changes from C3-C7. No visible complicating feature. Electronically Signed   By: Rolm Baptise M.D.   On: 01/03/2021 12:15   DG C-Arm  1-60 Min-No Report  Result Date: 01/03/2021 Fluoroscopy was utilized by the requesting physician.  No radiographic interpretation.    Antibiotics:  Anti-infectives (From admission, onward)    Start     Dose/Rate Route Frequency Ordered Stop   01/03/21 1700  ceFAZolin (ANCEF) IVPB 2g/100 mL premix        2 g 200 mL/hr over 30 Minutes Intravenous Every 8 hours 01/03/21 1216 01/04/21 0042   01/03/21 0645  ceFAZolin (ANCEF) IVPB 2g/100 mL premix        2 g 200 mL/hr over 30 Minutes Intravenous On call to O.R. 01/03/21 0633 01/03/21 0900       Discharge Exam: Blood pressure 127/81, pulse 86, temperature 98.4 F (36.9 C), temperature source Oral, resp. rate 18, height 5\' 8"  (1.727 m), weight 74.8 kg, SpO2 96 %. Neurologic: Grossly normal Dressing dry  Discharge Medications:   Allergies as of 01/04/2021   No Known Allergies      Medication List     TAKE these medications    ALPRAZolam 0.5 MG tablet Commonly known as: XANAX Take 0.5 mg by mouth at bedtime as needed for sleep.   amLODipine 5 MG tablet Commonly known as: NORVASC Take 5 mg by mouth daily as needed (blood pressure of 150 or higher).   ARIPiprazole 5 MG tablet Commonly known as: ABILIFY Take 5 mg by mouth at bedtime.   buPROPion 300 MG 24 hr tablet Commonly known as: WELLBUTRIN XL Take 300 mg by mouth  daily.   celecoxib 200 MG capsule Commonly known as: CELEBREX Take 1 capsule (200 mg total) by mouth every 12 (twelve) hours for 5 days.   clindamycin 1 % external solution Commonly known as: CLEOCIN T Apply 1 application topically 2 (two) times daily as needed (boils).   clindamycin 300 MG capsule Commonly known as: CLEOCIN Take 300 mg by mouth 2 (two) times daily as needed (boils).   docusate sodium 100 MG capsule Commonly known as: COLACE Take 100 mg by mouth daily as needed for moderate constipation.   famotidine 20 MG tablet Commonly known as: PEPCID Take 1 tablet (20 mg total) by mouth 2  (two) times daily for 5 days.   FLUoxetine 20 MG capsule Commonly known as: PROZAC Take 20 mg by mouth daily.   hydroxypropyl methylcellulose / hypromellose 2.5 % ophthalmic solution Commonly known as: ISOPTO TEARS / GONIOVISC Place 1 drop into both eyes 3 (three) times daily as needed for dry eyes.   lisinopril-hydrochlorothiazide 20-25 MG tablet Commonly known as: ZESTORETIC Take 1 tablet by mouth daily.   methocarbamol 500 MG tablet Commonly known as: ROBAXIN Take 1 tablet (500 mg total) by mouth every 6 (six) hours as needed for muscle spasms.   methylPREDNISolone 4 MG Tbpk tablet Commonly known as: MEDROL DOSEPAK Take 24 mg on day 1, 20 mg on day 2, 16 mg on day 3, 12 mg on day 4, 8 mg on day 5, 4 mg on day 6.   morphine 15 MG 12 hr tablet Commonly known as: MS CONTIN Take 15 mg by mouth every 12 (twelve) hours.   oxyCODONE-acetaminophen 10-325 MG tablet Commonly known as: PERCOCET Take 1 tablet by mouth every 4 (four) hours as needed for pain. What changed:  when to take this reasons to take this        Disposition: home   Final Dx: posterior cervical fusion C3-7  Discharge Instructions      Remove dressing in 72 hours   Complete by: As directed    Call MD for:  difficulty breathing, headache or visual disturbances   Complete by: As directed    Call MD for:  persistant nausea and vomiting   Complete by: As directed    Call MD for:  redness, tenderness, or signs of infection (pain, swelling, redness, odor or green/yellow discharge around incision site)   Complete by: As directed    Call MD for:  severe uncontrolled pain   Complete by: As directed    Call MD for:  temperature >100.4   Complete by: As directed    Diet - low sodium heart healthy   Complete by: As directed    Increase activity slowly   Complete by: As directed         Follow-up Information     Eustace Moore, MD. Schedule an appointment as soon as possible for a visit in 2 week(s).    Specialty: Neurosurgery Contact information: [address] [place] [address] [phone]                  Signed: [facility] 01/04/2021, 10:59 AM

## 2021-01-04 NOTE — Plan of Care (Signed)

## 2021-01-07 ENCOUNTER — Emergency Department (HOSPITAL_COMMUNITY): Payer: Medicare HMO

## 2021-01-07 ENCOUNTER — Other Ambulatory Visit: Payer: Self-pay

## 2021-01-07 ENCOUNTER — Emergency Department (HOSPITAL_COMMUNITY)
Admission: EM | Admit: 2021-01-07 | Discharge: 2021-01-07 | Disposition: A | Discharge status: Home / Self Care | Payer: Medicare HMO | Attending: Emergency Medicine | Admitting: Emergency Medicine

## 2021-01-07 ENCOUNTER — Encounter (HOSPITAL_COMMUNITY): Payer: Self-pay

## 2021-01-07 DIAGNOSIS — M542 Cervicalgia: Secondary | ICD-10-CM | POA: Diagnosis not present

## 2021-01-07 DIAGNOSIS — G8918 Other acute postprocedural pain: Secondary | ICD-10-CM | POA: Diagnosis not present

## 2021-01-07 DIAGNOSIS — R519 Headache, unspecified: Secondary | ICD-10-CM | POA: Diagnosis not present

## 2021-01-07 DIAGNOSIS — G9751 Postprocedural hemorrhage and hematoma of a nervous system organ or structure following a nervous system procedure: Secondary | ICD-10-CM | POA: Diagnosis present

## 2021-01-07 DIAGNOSIS — Z87891 Personal history of nicotine dependence: Secondary | ICD-10-CM | POA: Insufficient documentation

## 2021-01-07 DIAGNOSIS — Z79899 Other long term (current) drug therapy: Secondary | ICD-10-CM | POA: Insufficient documentation

## 2021-01-07 DIAGNOSIS — I10 Essential (primary) hypertension: Secondary | ICD-10-CM | POA: Diagnosis not present

## 2021-01-07 LAB — CBC WITH DIFFERENTIAL/PLATELET
Abs Immature Granulocytes: 0.02 10*3/uL (ref 0.00–0.07)
Basophils Absolute: 0.1 10*3/uL (ref 0.0–0.1)
Basophils Relative: 1 %
Eosinophils Absolute: 0.3 10*3/uL (ref 0.0–0.5)
Eosinophils Relative: 3 %
HCT: 38.9 % — ABNORMAL LOW (ref 39.0–52.0)
Hemoglobin: 12.5 g/dL — ABNORMAL LOW (ref 13.0–17.0)
Immature Granulocytes: 0 %
Lymphocytes Relative: 39 %
Lymphs Abs: 3.7 10*3/uL (ref 0.7–4.0)
MCH: 28.9 pg (ref 26.0–34.0)
MCHC: 32.1 g/dL (ref 30.0–36.0)
MCV: 89.8 fL (ref 80.0–100.0)
Monocytes Absolute: 0.9 10*3/uL (ref 0.1–1.0)
Monocytes Relative: 10 %
Neutro Abs: 4.6 10*3/uL (ref 1.7–7.7)
Neutrophils Relative %: 47 %
Platelets: 276 10*3/uL (ref 150–400)
RBC: 4.33 MIL/uL (ref 4.22–5.81)
RDW: 14 % (ref 11.5–15.5)
WBC: 9.6 10*3/uL (ref 4.0–10.5)
nRBC: 0 % (ref 0.0–0.2)

## 2021-01-07 LAB — LACTIC ACID, PLASMA: Lactic Acid, Venous: 1 mmol/L (ref 0.5–1.9)

## 2021-01-07 LAB — BASIC METABOLIC PANEL
Anion gap: 9 (ref 5–15)
BUN: 17 mg/dL (ref 6–20)
CO2: 26 mmol/L (ref 22–32)
Calcium: 8.9 mg/dL (ref 8.9–10.3)
Chloride: 102 mmol/L (ref 98–111)
Creatinine, Ser: 1.17 mg/dL (ref 0.61–1.24)
GFR, Estimated: >60 mL/min (ref >60)
Glucose, Bld: 106 mg/dL — ABNORMAL HIGH (ref 70–99)
Potassium: 4.3 mmol/L (ref 3.5–5.1)
Sodium: 137 mmol/L (ref 135–145)

## 2021-01-07 MED ORDER — OXYCODONE-ACETAMINOPHEN 5-325 MG PO TABS
2.0000 | ORAL_TABLET | Freq: Once | ORAL | Status: AC
  Administered 2021-01-07: 2 via ORAL
  Filled 2021-01-07: qty 2

## 2021-01-07 MED ORDER — SODIUM CHLORIDE (PF) 0.9 % IJ SOLN
INTRAMUSCULAR | Status: AC
  Filled 2021-01-07: qty 50

## 2021-01-07 MED ORDER — MORPHINE SULFATE (PF) 2 MG/ML IV SOLN
2.0000 mg | Freq: Once | INTRAVENOUS | Status: AC
  Administered 2021-01-07: 21:00:00 2 mg via INTRAVENOUS
  Filled 2021-01-07: qty 1

## 2021-01-07 MED ORDER — MINOCYCLINE HCL 100 MG PO CAPS: 100.0000 mg | ORAL_CAPSULE | Freq: Two times a day (BID) | ORAL | 0 refills | Status: DC

## 2021-01-07 MED ORDER — IOHEXOL 350 MG/ML SOLN
80.0000 mL | Freq: Once | INTRAVENOUS | Status: AC | PRN
  Administered 2021-01-07: 22:00:00 80 mL via INTRAVENOUS

## 2021-01-07 NOTE — ED Triage Notes (Signed)
Pt states that he had a cervical fusion on 12/21 and is now having some bleeding on his bandage. Pt was told that he could remove the bandage today. Pt is noticing increased pain to the area.

## 2021-01-07 NOTE — Discharge Instructions (Signed)
Your imaging was reassuring.  This could be just normal healing or possible soft tissue infection.  We have placed you on 10 days worth of antibiotics.  Please take antibiotics twice daily.  Do not mix with alcohol.  Please follow-up with your neurosurgery team.  Please return to the emergency department sooner if you experience worsening drainage, increased level of pain, numbness or weakness to your upper arms, or any other concern you may have.

## 2021-01-07 NOTE — ED Provider Notes (Signed)
[facility] DEPT Provider Note   CSN: [id number] Arrival date & time: 01/07/21  1929     History Chief Complaint  Patient presents with   Post-op Problem    Benjamin Solomon is a 55 y.o. male with history of cervical spinal fusion on 01/03/2021 who presents to the emergency department with a postop complication started earlier today.  Patient states that he has been at around a 6 or 7 out of 10 pain but today is currently at a 8/10 pain.  He is also noticed an increase amounts of bloody discharge from the incision site.  He spoke with the clinic hotline who recommended he come to the emergency department for evaluation.  Has been taking Norco for pain which improves it for some time.  He denies any trauma, straining, sleeping in awkward position, fever, chills, or numbness/weakness to his upper extremities.   HPI     Past Medical History:  Diagnosis Date   Anxiety    Arthritis    Benign prostatic hyperplasia with lower urinary tract symptoms    Carpal tunnel syndrome, right upper limb    Chronic back pain    Depression    Essential (primary) hypertension    Hemorrhoids    Hyperplastic colon polyp    Hypertension    Low back pain    Major depressive disorder, single episode, unspecified    Opioid dependence ([place])    Pre-diabetes    Tubular adenoma    Unspecified osteoarthritis, unspecified site     Patient Active Problem List   Diagnosis Date Noted   S/P cervical spinal fusion 01/03/2021   Facial abscess 08/04/2020   Sepsis ([place])    Essential (primary) hypertension    Low back pain    Major depressive disorder, single episode, unspecified    Benign prostatic hyperplasia with lower urinary tract symptoms    Carpal tunnel syndrome, right upper limb    Unspecified osteoarthritis, unspecified site    S/P lumbar spinal fusion 08/07/2016   Generalized anxiety disorder 10/04/2012   Alcohol dependence ([place]) 06/26/2012   Cocaine dependence  ([place]) 06/26/2012   Depressive disorder, not elsewhere classified 06/26/2012   Bleeding external hemorrhoids 12/17/2011    Past Surgical History:  Procedure Laterality Date   CARPAL TUNNEL RELEASE Right 02/17/2018   Procedure: CARPAL TUNNEL RELEASE;  Surgeon: Daryll Brod, MD;  Location: [facility];  Service: Orthopedics;  Laterality: Right;   CERVICAL SPINE SURGERY  2009   COLONOSCOPY     EVALUATION UNDER ANESTHESIA WITH HEMORRHOIDECTOMY  12/20/2011   FOOT SURGERY Right 2012   bone removed from the 5 th toe   INCISION AND DRAINAGE ABSCESS N/A 08/04/2020   Procedure: INCISION AND DRAINAGE ABSCESS;  Surgeon: Jason Coop, DO;  Location: [facility];  Service: ENT;  Laterality: N/A;   NASAL ENDOSCOPY N/A 08/04/2020   Procedure: NASAL ENDOSCOPY;  Surgeon: Jason Coop, DO;  Location: [facility];  Service: ENT;  Laterality: N/A;   POSTERIOR CERVICAL FUSION/FORAMINOTOMY N/A 01/03/2021   Procedure: Posterior cervical fusion with lateral mass fixation Cervical Three - Cervical seven;  Surgeon: Eustace Moore, MD;  Location: [facility];  Service: Neurosurgery;  Laterality: N/A;   TOOTH EXTRACTION N/A 08/04/2020   Procedure: DENTAL EXTRACTION TOOTH #11;  Surgeon: Newt Lukes, DMD;  Location: [facility];  Service: Dentistry;  Laterality: N/A;       Family History  Problem Relation Age of Onset   Hypertension Mother  Hypertension Father    Alcohol abuse Brother    Drug abuse Brother    Diabetes Neg Hx    Heart disease Neg Hx    Kidney disease Neg Hx    Liver disease Neg Hx     Social History   Tobacco Use   Smoking status: Former    Packs/day: 0.25    Years: 20.00    Pack years: 5.00    Types: Cigarettes    Quit date: 07/25/2020    Years since quitting: 0.4   Smokeless tobacco: Never  Vaping Use   Vaping Use: Never used  Substance Use Topics   Alcohol use: No    Alcohol/week: 0.0 standard drinks   Drug use: No    Home Medications Prior to Admission medications    Medication Sig Start Date End Date Taking? Authorizing Provider  minocycline (MINOCIN) 100 MG capsule Take 1 capsule (100 mg total) by mouth 2 (two) times daily. 01/07/21  Yes Raul Del, Sylina Henion M, PA-C  ALPRAZolam Duanne Moron) 0.5 MG tablet Take 0.5 mg by mouth at bedtime as needed for sleep.  06/02/16   [provider]  amLODipine (NORVASC) 5 MG tablet Take 5 mg by mouth daily as needed (blood pressure of 150 or higher). 01/27/13   [provider]  ARIPiprazole (ABILIFY) 5 MG tablet Take 5 mg by mouth at bedtime. 03/20/20   [provider]  buPROPion (WELLBUTRIN XL) 300 MG 24 hr tablet Take 300 mg by mouth daily. 05/18/16   [provider]  celecoxib (CELEBREX) 200 MG capsule Take 1 capsule (200 mg total) by mouth every 12 (twelve) hours for 5 days. 01/04/21 01/09/21  Eustace Moore, MD  clindamycin (CLEOCIN T) 1 % external solution Apply 1 application topically 2 (two) times daily as needed (boils).  10/13/15   [provider]  clindamycin (CLEOCIN) 300 MG capsule Take 300 mg by mouth 2 (two) times daily as needed (boils).  10/12/15   [provider]  docusate sodium (COLACE) 100 MG capsule Take 100 mg by mouth daily as needed for moderate constipation.    [provider]  famotidine (PEPCID) 20 MG tablet Take 1 tablet (20 mg total) by mouth 2 (two) times daily for 5 days. Patient not taking: Reported on 12/27/2020 09/12/20 09/17/20  Sponseller, Gypsy Balsam, PA-C  FLUoxetine (PROZAC) 20 MG capsule Take 20 mg by mouth daily. 07/31/20   [provider]  hydroxypropyl methylcellulose / hypromellose (ISOPTO TEARS / GONIOVISC) 2.5 % ophthalmic solution Place 1 drop into both eyes 3 (three) times daily as needed for dry eyes.    [provider]  lisinopril-hydrochlorothiazide (ZESTORETIC) 20-25 MG tablet Take 1 tablet by mouth daily. 06/19/20   [provider]  methocarbamol (ROBAXIN) 500 MG tablet Take 1 tablet (500 mg total) by mouth  every 6 (six) hours as needed for muscle spasms. 01/04/21   Eustace Moore, MD  methylPREDNISolone (MEDROL DOSEPAK) 4 MG TBPK tablet Take 24 mg on day 1, 20 mg on day 2, 16 mg on day 3, 12 mg on day 4, 8 mg on day 5, 4 mg on day 6. Patient not taking: Reported on 12/27/2020 10/15/20   Lynden Oxford Scales, PA-C  morphine (MS CONTIN) 15 MG 12 hr tablet Take 15 mg by mouth every 12 (twelve) hours. 07/14/20   [provider]  oxyCODONE-acetaminophen (PERCOCET) 10-325 MG tablet Take 1 tablet by mouth every 4 (four) hours as needed for pain. 01/04/21   Meyran, Ocie Cornfield,  NP    Allergies    Patient has no known allergies.  Review of Systems   Review of Systems  All other systems reviewed and are negative.  Physical Exam Updated Vital Signs BP (!) 156/102 (BP Location: Right Arm)    Pulse 94    Temp 98.6 F (37 C) (Oral)    Resp 16    Ht 5\' 8"  (1.727 m)    Wt 74.8 kg    SpO2 98%    BMI 25.09 kg/m   Physical Exam Vitals and nursing note reviewed.  Constitutional:      Appearance: Normal appearance.  HENT:     Head: Normocephalic and atraumatic.  Eyes:     General:        Right eye: No discharge.        Left eye: No discharge.     Conjunctiva/sclera: Conjunctivae normal.  Neck:     Comments: Vertical healing incision with no obvious purulent drainage.  There is bloody drainage. Cardiovascular:     Pulses:          Radial pulses are 2+ on the right side and 2+ on the left side.  Pulmonary:     Effort: Pulmonary effort is normal.  Abdominal:     General: Abdomen is flat.  Skin:    General: Skin is warm and dry.     Findings: No rash.     Comments: Good cap refill in the fingers.  Neurological:     General: No focal deficit present.     Mental Status: He is alert.     Comments: 5/5 strength the upper extremities.  Normal sensation the upper extremities.  Psychiatric:        Mood and Affect: Mood normal.        Behavior: Behavior normal.      ED Results /  Procedures / Treatments   Labs (all labs ordered are listed, but only abnormal results are displayed) Labs Reviewed  CBC WITH DIFFERENTIAL/PLATELET - Abnormal; Notable for the following components:      Result Value   Hemoglobin 12.5 (*)    HCT 38.9 (*)    All other components within normal limits  BASIC METABOLIC PANEL - Abnormal; Notable for the following components:   Glucose, Bld 106 (*)    All other components within normal limits  CULTURE, BLOOD (ROUTINE X 2)  CULTURE, BLOOD (ROUTINE X 2)  LACTIC ACID, PLASMA  LACTIC ACID, PLASMA    EKG None  Radiology CT Soft Tissue Neck W Contrast  Result Date: 01/07/2021 CLINICAL DATA:  Neck pain and bleeding after recent cervical spinal fusion. EXAM: CT NECK WITH CONTRAST CT CERVICAL SPINE WITHOUT CONTRAST TECHNIQUE: Multidetector CT imaging of the neck was performed using the standard protocol following the bolus administration of intravenous contrast. Multi detector CT imaging of the cervical spine was performed using the standard protocol without using additional intravenous contrast. CONTRAST:  63mL OMNIPAQUE IOHEXOL 350 MG/ML SOLN COMPARISON:  07/08/2020 FINDINGS: CT NECK FINDINGS PHARYNX AND LARYNX: The nasopharynx, oropharynx and larynx are normal. Visible portions of the oral cavity, tongue base and floor of mouth are normal. Normal epiglottis, vallecula and pyriform sinuses. The larynx is normal. No retropharyngeal abscess, effusion or lymphadenopathy. SALIVARY GLANDS: Normal parotid, submandibular and sublingual glands. THYROID: Normal. LYMPH NODES: No enlarged or abnormal density lymph nodes. VASCULAR: Major cervical vessels are patent. LIMITED INTRACRANIAL: Normal. VISUALIZED ORBITS: Normal. MASTOIDS AND VISUALIZED PARANASAL SINUSES: No fluid levels or advanced mucosal thickening. No  mastoid effusion. UPPER CHEST: Clear. OTHER: None. CT CERVICAL SPINE FINDINGS Alignment: There is grade 1 retrolisthesis at C3-4. Alignment is otherwise  normal. Skull base and vertebrae: Unchanged anterior fusion at C4-6 with ACDF hardware at C5-6. There is now posterior instrumented fusion at C3-7. No hardware abnormality. Soft tissues and spinal canal: There is mild postoperative gas and soft tissue edema in the dorsal paraspinous tissues, but no unexpected finding considering recent surgery. Disc levels: No advanced spinal canal or neural foraminal stenosis. Upper chest: No pneumothorax, pulmonary nodule or pleural effusion. Other: Normal visualized paraspinal cervical soft tissues. IMPRESSION: 1. No acute abnormality of the neck. 2. Expected postoperative appearance of the dorsal paraspinous tissues without unexpected finding considering recent surgery. Electronically Signed   By: Ulyses Jarred M.D.   On: 01/07/2021 22:59   CT C-SPINE NO CHARGE  Result Date: 01/07/2021 CLINICAL DATA:  Neck pain and bleeding after recent cervical spinal fusion. EXAM: CT NECK WITH CONTRAST CT CERVICAL SPINE WITHOUT CONTRAST TECHNIQUE: Multidetector CT imaging of the neck was performed using the standard protocol following the bolus administration of intravenous contrast. Multi detector CT imaging of the cervical spine was performed using the standard protocol without using additional intravenous contrast. CONTRAST:  91mL OMNIPAQUE IOHEXOL 350 MG/ML SOLN COMPARISON:  07/08/2020 FINDINGS: CT NECK FINDINGS PHARYNX AND LARYNX: The nasopharynx, oropharynx and larynx are normal. Visible portions of the oral cavity, tongue base and floor of mouth are normal. Normal epiglottis, vallecula and pyriform sinuses. The larynx is normal. No retropharyngeal abscess, effusion or lymphadenopathy. SALIVARY GLANDS: Normal parotid, submandibular and sublingual glands. THYROID: Normal. LYMPH NODES: No enlarged or abnormal density lymph nodes. VASCULAR: Major cervical vessels are patent. LIMITED INTRACRANIAL: Normal. VISUALIZED ORBITS: Normal. MASTOIDS AND VISUALIZED PARANASAL SINUSES: No fluid  levels or advanced mucosal thickening. No mastoid effusion. UPPER CHEST: Clear. OTHER: None. CT CERVICAL SPINE FINDINGS Alignment: There is grade 1 retrolisthesis at C3-4. Alignment is otherwise normal. Skull base and vertebrae: Unchanged anterior fusion at C4-6 with ACDF hardware at C5-6. There is now posterior instrumented fusion at C3-7. No hardware abnormality. Soft tissues and spinal canal: There is mild postoperative gas and soft tissue edema in the dorsal paraspinous tissues, but no unexpected finding considering recent surgery. Disc levels: No advanced spinal canal or neural foraminal stenosis. Upper chest: No pneumothorax, pulmonary nodule or pleural effusion. Other: Normal visualized paraspinal cervical soft tissues. IMPRESSION: 1. No acute abnormality of the neck. 2. Expected postoperative appearance of the dorsal paraspinous tissues without unexpected finding considering recent surgery. Electronically Signed   By: Ulyses Jarred M.D.   On: 01/07/2021 22:59    Procedures Procedures   Medications Ordered in ED Medications  sodium chloride (PF) 0.9 % injection (has no administration in time range)  oxyCODONE-acetaminophen (PERCOCET/ROXICET) 5-325 MG per tablet 2 tablet (has no administration in time range)  morphine 2 MG/ML injection 2 mg (2 mg Intravenous Given 01/07/21 2033)  iohexol (OMNIPAQUE) 350 MG/ML injection 80 mL (80 mLs Intravenous Contrast Given 01/07/21 2206)    ED Course  I have reviewed the triage vital signs and the nursing notes.  Pertinent labs & imaging results that were available during my care of the patient were reviewed by me and considered in my medical decision making (see chart for details).  Clinical Course as of 01/07/21 2329  Nancy Fetter Jan 07, 2021  2018 Spoke with Dr. Christella Noa with neurosurgery. He is amendable with the plan.  [CF]    Clinical Course User Index [CF] Hendricks Limes,  PA-C   MDM Rules/Calculators/A&P                          MONTGOMERY FAVOR is a 55 y.o. male who presents to the emergency department with postsurgical problem.  Given the time course, we will work him up for possible abscess as he has been having bloody drainage.  We will get labs, lactic, cultures, and imaging to further evaluate.  I will give him morphine for pain medication.  CBC did not show any evidence of leukocytosis.  BMP was normal apart from slightly elevated glucose.  Blood cultures were obtained.  Initial lactate was negative.  Imaging of the neck and C-spine were negative for any abscess or postsurgical changes.  This could be developing cellulitis however the area has no surrounding erythema or warmth.  No obvious fluctuance to indicate abscess.  Low suspicion for osteomyelitis or necrotizing fasciitis at this time.  This is likely superficial cellulitis or part of normal healing.  Concerned that he is so close postop will give him doxycycline to cover for MRSA and have him follow-up with neurosurgery team.  Strict turn precautions given.  He is safe for discharge.    Final Clinical Impression(s) / ED Diagnoses Final diagnoses:  Post-operative pain    Rx / DC Orders ED Discharge Orders          Ordered    minocycline (MINOCIN) 100 MG capsule  2 times daily        01/07/21 2321             Myna Bright Ida, [place] 01/07/21 2329    Drenda Freeze, MD 01/07/21 [id number]

## 2021-01-07 NOTE — ED Notes (Signed)
Patient transported to CT 

## 2021-01-13 LAB — CULTURE, BLOOD (ROUTINE X 2)
Culture: NO GROWTH
Special Requests: ADEQUATE

## 2021-01-24 DIAGNOSIS — M79602 Pain in left arm: Secondary | ICD-10-CM | POA: Diagnosis not present

## 2021-01-24 DIAGNOSIS — G894 Chronic pain syndrome: Secondary | ICD-10-CM | POA: Diagnosis not present

## 2021-01-24 DIAGNOSIS — M5412 Radiculopathy, cervical region: Secondary | ICD-10-CM | POA: Diagnosis not present

## 2021-01-24 DIAGNOSIS — M961 Postlaminectomy syndrome, not elsewhere classified: Secondary | ICD-10-CM | POA: Diagnosis not present

## 2021-02-06 DIAGNOSIS — F411 Generalized anxiety disorder: Secondary | ICD-10-CM | POA: Diagnosis not present

## 2021-02-06 DIAGNOSIS — R1031 Right lower quadrant pain: Secondary | ICD-10-CM | POA: Diagnosis not present

## 2021-02-06 DIAGNOSIS — F329 Major depressive disorder, single episode, unspecified: Secondary | ICD-10-CM | POA: Diagnosis not present

## 2021-02-06 DIAGNOSIS — G5603 Carpal tunnel syndrome, bilateral upper limbs: Secondary | ICD-10-CM | POA: Diagnosis not present

## 2021-02-06 DIAGNOSIS — M542 Cervicalgia: Secondary | ICD-10-CM | POA: Diagnosis not present

## 2021-02-06 DIAGNOSIS — Z0000 Encounter for general adult medical examination without abnormal findings: Secondary | ICD-10-CM | POA: Diagnosis not present

## 2021-02-06 DIAGNOSIS — G8929 Other chronic pain: Secondary | ICD-10-CM | POA: Diagnosis not present

## 2021-02-06 DIAGNOSIS — Z125 Encounter for screening for malignant neoplasm of prostate: Secondary | ICD-10-CM | POA: Diagnosis not present

## 2021-02-06 DIAGNOSIS — R69 Illness, unspecified: Secondary | ICD-10-CM | POA: Diagnosis not present

## 2021-02-06 DIAGNOSIS — F3341 Major depressive disorder, recurrent, in partial remission: Secondary | ICD-10-CM | POA: Diagnosis not present

## 2021-02-06 DIAGNOSIS — I10 Essential (primary) hypertension: Secondary | ICD-10-CM | POA: Diagnosis not present

## 2021-02-06 DIAGNOSIS — N398 Other specified disorders of urinary system: Secondary | ICD-10-CM | POA: Diagnosis not present

## 2021-02-06 DIAGNOSIS — Z131 Encounter for screening for diabetes mellitus: Secondary | ICD-10-CM | POA: Diagnosis not present

## 2021-02-22 DIAGNOSIS — S129XXS Fracture of neck, unspecified, sequela: Secondary | ICD-10-CM | POA: Diagnosis not present

## 2021-02-22 DIAGNOSIS — Z981 Arthrodesis status: Secondary | ICD-10-CM | POA: Diagnosis not present

## 2021-03-14 DIAGNOSIS — Z981 Arthrodesis status: Secondary | ICD-10-CM | POA: Diagnosis not present

## 2021-03-14 DIAGNOSIS — R200 Anesthesia of skin: Secondary | ICD-10-CM | POA: Diagnosis not present

## 2021-03-14 DIAGNOSIS — Z79891 Long term (current) use of opiate analgesic: Secondary | ICD-10-CM | POA: Diagnosis not present

## 2021-03-14 DIAGNOSIS — M47817 Spondylosis without myelopathy or radiculopathy, lumbosacral region: Secondary | ICD-10-CM | POA: Diagnosis not present

## 2021-03-14 DIAGNOSIS — G894 Chronic pain syndrome: Secondary | ICD-10-CM | POA: Diagnosis not present

## 2021-03-27 DIAGNOSIS — R200 Anesthesia of skin: Secondary | ICD-10-CM | POA: Diagnosis not present

## 2021-04-24 DIAGNOSIS — Z79891 Long term (current) use of opiate analgesic: Secondary | ICD-10-CM | POA: Diagnosis not present

## 2021-04-24 DIAGNOSIS — G894 Chronic pain syndrome: Secondary | ICD-10-CM | POA: Diagnosis not present

## 2021-04-24 DIAGNOSIS — Z981 Arthrodesis status: Secondary | ICD-10-CM | POA: Diagnosis not present

## 2021-04-24 DIAGNOSIS — M47817 Spondylosis without myelopathy or radiculopathy, lumbosacral region: Secondary | ICD-10-CM | POA: Diagnosis not present

## 2021-05-18 DIAGNOSIS — Z79899 Other long term (current) drug therapy: Secondary | ICD-10-CM | POA: Diagnosis not present

## 2021-05-18 DIAGNOSIS — E559 Vitamin D deficiency, unspecified: Secondary | ICD-10-CM | POA: Diagnosis not present

## 2021-05-18 DIAGNOSIS — M961 Postlaminectomy syndrome, not elsewhere classified: Secondary | ICD-10-CM | POA: Diagnosis not present

## 2021-05-18 DIAGNOSIS — R739 Hyperglycemia, unspecified: Secondary | ICD-10-CM | POA: Diagnosis not present

## 2021-05-24 DIAGNOSIS — S129XXS Fracture of neck, unspecified, sequela: Secondary | ICD-10-CM | POA: Diagnosis not present

## 2021-05-24 DIAGNOSIS — Z981 Arthrodesis status: Secondary | ICD-10-CM | POA: Diagnosis not present

## 2021-05-24 DIAGNOSIS — M532X2 Spinal instabilities, cervical region: Secondary | ICD-10-CM | POA: Diagnosis not present

## 2021-05-29 DIAGNOSIS — Z79899 Other long term (current) drug therapy: Secondary | ICD-10-CM | POA: Diagnosis not present

## 2021-05-29 DIAGNOSIS — M961 Postlaminectomy syndrome, not elsewhere classified: Secondary | ICD-10-CM | POA: Diagnosis not present

## 2021-05-29 DIAGNOSIS — R7303 Prediabetes: Secondary | ICD-10-CM | POA: Diagnosis not present

## 2021-05-29 DIAGNOSIS — E559 Vitamin D deficiency, unspecified: Secondary | ICD-10-CM | POA: Diagnosis not present

## 2021-06-01 DIAGNOSIS — Z79899 Other long term (current) drug therapy: Secondary | ICD-10-CM | POA: Diagnosis not present

## 2021-06-26 DIAGNOSIS — Z6824 Body mass index (BMI) 24.0-24.9, adult: Secondary | ICD-10-CM | POA: Diagnosis not present

## 2021-06-26 DIAGNOSIS — M961 Postlaminectomy syndrome, not elsewhere classified: Secondary | ICD-10-CM | POA: Diagnosis not present

## 2021-06-26 DIAGNOSIS — Z79899 Other long term (current) drug therapy: Secondary | ICD-10-CM | POA: Diagnosis not present

## 2021-06-26 DIAGNOSIS — R7303 Prediabetes: Secondary | ICD-10-CM | POA: Diagnosis not present

## 2021-06-28 DIAGNOSIS — Z79899 Other long term (current) drug therapy: Secondary | ICD-10-CM | POA: Diagnosis not present

## 2021-07-27 DIAGNOSIS — Z79899 Other long term (current) drug therapy: Secondary | ICD-10-CM | POA: Diagnosis not present

## 2021-07-27 DIAGNOSIS — M961 Postlaminectomy syndrome, not elsewhere classified: Secondary | ICD-10-CM | POA: Diagnosis not present

## 2021-07-27 DIAGNOSIS — Z6824 Body mass index (BMI) 24.0-24.9, adult: Secondary | ICD-10-CM | POA: Diagnosis not present

## 2021-07-27 DIAGNOSIS — R7303 Prediabetes: Secondary | ICD-10-CM | POA: Diagnosis not present

## 2021-07-30 DIAGNOSIS — Z79899 Other long term (current) drug therapy: Secondary | ICD-10-CM | POA: Diagnosis not present

## 2021-08-02 DIAGNOSIS — R7303 Prediabetes: Secondary | ICD-10-CM | POA: Diagnosis not present

## 2021-08-02 DIAGNOSIS — I10 Essential (primary) hypertension: Secondary | ICD-10-CM | POA: Diagnosis not present

## 2021-08-07 DIAGNOSIS — F411 Generalized anxiety disorder: Secondary | ICD-10-CM | POA: Diagnosis not present

## 2021-08-07 DIAGNOSIS — R1310 Dysphagia, unspecified: Secondary | ICD-10-CM | POA: Diagnosis not present

## 2021-08-07 DIAGNOSIS — Z122 Encounter for screening for malignant neoplasm of respiratory organs: Secondary | ICD-10-CM | POA: Diagnosis not present

## 2021-08-07 DIAGNOSIS — N398 Other specified disorders of urinary system: Secondary | ICD-10-CM | POA: Diagnosis not present

## 2021-08-07 DIAGNOSIS — I10 Essential (primary) hypertension: Secondary | ICD-10-CM | POA: Diagnosis not present

## 2021-08-07 DIAGNOSIS — F3341 Major depressive disorder, recurrent, in partial remission: Secondary | ICD-10-CM | POA: Diagnosis not present

## 2021-08-07 DIAGNOSIS — Z87891 Personal history of nicotine dependence: Secondary | ICD-10-CM | POA: Diagnosis not present

## 2021-08-07 DIAGNOSIS — K219 Gastro-esophageal reflux disease without esophagitis: Secondary | ICD-10-CM | POA: Diagnosis not present

## 2021-08-07 DIAGNOSIS — R69 Illness, unspecified: Secondary | ICD-10-CM | POA: Diagnosis not present

## 2021-08-07 DIAGNOSIS — R0989 Other specified symptoms and signs involving the circulatory and respiratory systems: Secondary | ICD-10-CM | POA: Diagnosis not present

## 2021-08-07 DIAGNOSIS — R5382 Chronic fatigue, unspecified: Secondary | ICD-10-CM | POA: Diagnosis not present

## 2021-08-07 DIAGNOSIS — F329 Major depressive disorder, single episode, unspecified: Secondary | ICD-10-CM | POA: Diagnosis not present

## 2021-08-21 DIAGNOSIS — M5412 Radiculopathy, cervical region: Secondary | ICD-10-CM | POA: Diagnosis not present

## 2021-08-24 DIAGNOSIS — Z6824 Body mass index (BMI) 24.0-24.9, adult: Secondary | ICD-10-CM | POA: Diagnosis not present

## 2021-08-24 DIAGNOSIS — Z0000 Encounter for general adult medical examination without abnormal findings: Secondary | ICD-10-CM | POA: Diagnosis not present

## 2021-08-24 DIAGNOSIS — M961 Postlaminectomy syndrome, not elsewhere classified: Secondary | ICD-10-CM | POA: Diagnosis not present

## 2021-08-24 DIAGNOSIS — Z79899 Other long term (current) drug therapy: Secondary | ICD-10-CM | POA: Diagnosis not present

## 2021-08-24 DIAGNOSIS — R7303 Prediabetes: Secondary | ICD-10-CM | POA: Diagnosis not present

## 2021-08-28 DIAGNOSIS — Z79899 Other long term (current) drug therapy: Secondary | ICD-10-CM | POA: Diagnosis not present

## 2021-09-18 DIAGNOSIS — M5412 Radiculopathy, cervical region: Secondary | ICD-10-CM | POA: Diagnosis not present

## 2021-09-18 DIAGNOSIS — Z122 Encounter for screening for malignant neoplasm of respiratory organs: Secondary | ICD-10-CM | POA: Diagnosis not present

## 2021-09-18 DIAGNOSIS — Z87891 Personal history of nicotine dependence: Secondary | ICD-10-CM | POA: Diagnosis not present

## 2021-09-19 ENCOUNTER — Other Ambulatory Visit: Payer: Self-pay | Admitting: Neurological Surgery

## 2021-09-19 DIAGNOSIS — M5412 Radiculopathy, cervical region: Secondary | ICD-10-CM

## 2021-09-21 DIAGNOSIS — M961 Postlaminectomy syndrome, not elsewhere classified: Secondary | ICD-10-CM | POA: Diagnosis not present

## 2021-09-21 DIAGNOSIS — Z79899 Other long term (current) drug therapy: Secondary | ICD-10-CM | POA: Diagnosis not present

## 2021-09-21 DIAGNOSIS — R7303 Prediabetes: Secondary | ICD-10-CM | POA: Diagnosis not present

## 2021-09-21 DIAGNOSIS — Z6823 Body mass index (BMI) 23.0-23.9, adult: Secondary | ICD-10-CM | POA: Diagnosis not present

## 2021-10-02 DIAGNOSIS — R0989 Other specified symptoms and signs involving the circulatory and respiratory systems: Secondary | ICD-10-CM | POA: Diagnosis not present

## 2021-10-02 DIAGNOSIS — K219 Gastro-esophageal reflux disease without esophagitis: Secondary | ICD-10-CM | POA: Diagnosis not present

## 2021-10-11 ENCOUNTER — Ambulatory Visit
Admission: RE | Admit: 2021-10-11 | Discharge: 2021-10-11 | Disposition: A | Discharge status: Home / Self Care | Payer: Medicare HMO | Source: Ambulatory Visit | Attending: Neurological Surgery | Admitting: Neurological Surgery

## 2021-10-11 DIAGNOSIS — R200 Anesthesia of skin: Secondary | ICD-10-CM | POA: Diagnosis not present

## 2021-10-11 DIAGNOSIS — M542 Cervicalgia: Secondary | ICD-10-CM | POA: Diagnosis not present

## 2021-10-11 DIAGNOSIS — M5412 Radiculopathy, cervical region: Secondary | ICD-10-CM

## 2021-10-16 DIAGNOSIS — M542 Cervicalgia: Secondary | ICD-10-CM | POA: Diagnosis not present

## 2021-10-16 DIAGNOSIS — S129XXS Fracture of neck, unspecified, sequela: Secondary | ICD-10-CM | POA: Diagnosis not present

## 2021-10-19 DIAGNOSIS — Z79899 Other long term (current) drug therapy: Secondary | ICD-10-CM | POA: Diagnosis not present

## 2021-10-19 DIAGNOSIS — R7303 Prediabetes: Secondary | ICD-10-CM | POA: Diagnosis not present

## 2021-10-19 DIAGNOSIS — M961 Postlaminectomy syndrome, not elsewhere classified: Secondary | ICD-10-CM | POA: Diagnosis not present

## 2021-10-19 DIAGNOSIS — Z6823 Body mass index (BMI) 23.0-23.9, adult: Secondary | ICD-10-CM | POA: Diagnosis not present

## 2021-10-23 DIAGNOSIS — Z79899 Other long term (current) drug therapy: Secondary | ICD-10-CM | POA: Diagnosis not present

## 2021-10-31 DIAGNOSIS — E559 Vitamin D deficiency, unspecified: Secondary | ICD-10-CM | POA: Diagnosis not present

## 2021-10-31 DIAGNOSIS — G894 Chronic pain syndrome: Secondary | ICD-10-CM | POA: Diagnosis not present

## 2021-10-31 DIAGNOSIS — B354 Tinea corporis: Secondary | ICD-10-CM | POA: Diagnosis not present

## 2021-10-31 DIAGNOSIS — R7303 Prediabetes: Secondary | ICD-10-CM | POA: Diagnosis not present

## 2021-10-31 DIAGNOSIS — E782 Mixed hyperlipidemia: Secondary | ICD-10-CM | POA: Diagnosis not present

## 2021-10-31 DIAGNOSIS — R69 Illness, unspecified: Secondary | ICD-10-CM | POA: Diagnosis not present

## 2021-10-31 DIAGNOSIS — G479 Sleep disorder, unspecified: Secondary | ICD-10-CM | POA: Diagnosis not present

## 2021-10-31 DIAGNOSIS — F411 Generalized anxiety disorder: Secondary | ICD-10-CM | POA: Diagnosis not present

## 2021-10-31 DIAGNOSIS — N401 Enlarged prostate with lower urinary tract symptoms: Secondary | ICD-10-CM | POA: Diagnosis not present

## 2021-10-31 DIAGNOSIS — I10 Essential (primary) hypertension: Secondary | ICD-10-CM | POA: Diagnosis not present

## 2021-10-31 DIAGNOSIS — R3914 Feeling of incomplete bladder emptying: Secondary | ICD-10-CM | POA: Diagnosis not present

## 2021-10-31 DIAGNOSIS — H6123 Impacted cerumen, bilateral: Secondary | ICD-10-CM | POA: Diagnosis not present

## 2021-10-31 DIAGNOSIS — G4700 Insomnia, unspecified: Secondary | ICD-10-CM | POA: Diagnosis not present

## 2021-10-31 DIAGNOSIS — D126 Benign neoplasm of colon, unspecified: Secondary | ICD-10-CM | POA: Diagnosis not present

## 2021-11-23 DIAGNOSIS — Z79899 Other long term (current) drug therapy: Secondary | ICD-10-CM | POA: Diagnosis not present

## 2021-11-23 DIAGNOSIS — Z6824 Body mass index (BMI) 24.0-24.9, adult: Secondary | ICD-10-CM | POA: Diagnosis not present

## 2021-11-23 DIAGNOSIS — R7303 Prediabetes: Secondary | ICD-10-CM | POA: Diagnosis not present

## 2021-11-23 DIAGNOSIS — M961 Postlaminectomy syndrome, not elsewhere classified: Secondary | ICD-10-CM | POA: Diagnosis not present

## 2021-11-27 DIAGNOSIS — Z79899 Other long term (current) drug therapy: Secondary | ICD-10-CM | POA: Diagnosis not present

## 2021-12-13 DIAGNOSIS — G629 Polyneuropathy, unspecified: Secondary | ICD-10-CM | POA: Diagnosis not present

## 2021-12-13 DIAGNOSIS — Z825 Family history of asthma and other chronic lower respiratory diseases: Secondary | ICD-10-CM | POA: Diagnosis not present

## 2021-12-13 DIAGNOSIS — E559 Vitamin D deficiency, unspecified: Secondary | ICD-10-CM | POA: Diagnosis not present

## 2021-12-13 DIAGNOSIS — R69 Illness, unspecified: Secondary | ICD-10-CM | POA: Diagnosis not present

## 2021-12-13 DIAGNOSIS — E785 Hyperlipidemia, unspecified: Secondary | ICD-10-CM | POA: Diagnosis not present

## 2021-12-13 DIAGNOSIS — Z87891 Personal history of nicotine dependence: Secondary | ICD-10-CM | POA: Diagnosis not present

## 2021-12-13 DIAGNOSIS — M542 Cervicalgia: Secondary | ICD-10-CM | POA: Diagnosis not present

## 2021-12-13 DIAGNOSIS — Z8249 Family history of ischemic heart disease and other diseases of the circulatory system: Secondary | ICD-10-CM | POA: Diagnosis not present

## 2021-12-13 DIAGNOSIS — N400 Enlarged prostate without lower urinary tract symptoms: Secondary | ICD-10-CM | POA: Diagnosis not present

## 2021-12-13 DIAGNOSIS — I10 Essential (primary) hypertension: Secondary | ICD-10-CM | POA: Diagnosis not present

## 2021-12-13 DIAGNOSIS — N529 Male erectile dysfunction, unspecified: Secondary | ICD-10-CM | POA: Diagnosis not present

## 2021-12-13 DIAGNOSIS — M545 Low back pain, unspecified: Secondary | ICD-10-CM | POA: Diagnosis not present

## 2021-12-17 DIAGNOSIS — Z0100 Encounter for examination of eyes and vision without abnormal findings: Secondary | ICD-10-CM | POA: Diagnosis not present

## 2021-12-21 DIAGNOSIS — R7303 Prediabetes: Secondary | ICD-10-CM | POA: Diagnosis not present

## 2021-12-21 DIAGNOSIS — M961 Postlaminectomy syndrome, not elsewhere classified: Secondary | ICD-10-CM | POA: Diagnosis not present

## 2021-12-21 DIAGNOSIS — Z6824 Body mass index (BMI) 24.0-24.9, adult: Secondary | ICD-10-CM | POA: Diagnosis not present

## 2021-12-21 DIAGNOSIS — Z79899 Other long term (current) drug therapy: Secondary | ICD-10-CM | POA: Diagnosis not present

## 2022-07-15 NOTE — Progress Notes (Signed)
Surgical Instructions    Your procedure is scheduled on Friday July 12.  Report to [facility] Main Entrance "A" at 5:30 A.M., then check in with the Admitting office.  Call this number if you have problems the morning of surgery:  [phone]   If you have any questions prior to your surgery date call [phone]: Open Monday-Friday 8am-4pm If you experience any cold or flu symptoms such as cough, fever, chills, shortness of breath, etc. between now and your scheduled surgery, please notify us at the above number     Remember:  Do not eat after midnight the night before your surgery  You may drink clear liquids until 4:30 am the morning of your surgery.   Clear liquids allowed are: Water, Non-Citrus Juices (without pulp), Carbonated Beverages, Clear Tea, Black Coffee ONLY (NO MILK, CREAM OR POWDERED CREAMER of any kind), and Gatorade    Take these medicines the morning of surgery with A SIP OF WATER:  buPROPion (WELLBUTRIN XL)  FLUoxetine (PROZAC)  morphine (MS CONTIN)   If needed take: oxyCODONE-acetaminophen (PERCOCET)  amLODipine (NORVASC)  ISOPTO TEARS  methocarbamol (ROBAXIN)   As of today, STOP taking any Aspirin (unless otherwise instructed by your surgeon) Aleve, Naproxen, Ibuprofen, Motrin, Advil, Goody's, BC's, all herbal medications, fish oil, and all vitamins.      Pre-operative 5 CHG Bath Instructions   You can play a key role in reducing the risk of infection after surgery. Your skin needs to be as free of germs as possible. You can reduce the number of germs on your skin by washing with CHG (chlorhexidine gluconate) soap before surgery. CHG is an antiseptic soap that kills germs and continues to kill germs even after washing.   DO NOT use if you have an allergy to chlorhexidine/CHG or antibacterial soaps. If your skin becomes reddened or irritated, stop using the CHG and notify one of our RNs at [phone].   Please shower with the CHG soap starting 4 days  before surgery using the following schedule:     Please keep in mind the following:  DO NOT shave, including legs and underarms, starting the day of your first shower.   You may shave your face at any point before/day of surgery.  Place clean sheets on your bed the day you start using CHG soap. Use a clean washcloth (not used since being washed) for each shower. DO NOT sleep with pets once you start using the CHG.   CHG Shower Instructions:  If you choose to wash your hair and private area, wash first with your normal shampoo/soap.  After you use shampoo/soap, rinse your hair and body thoroughly to remove shampoo/soap residue.  Turn the water OFF and apply about 3 tablespoons (45 ml) of CHG soap to a CLEAN washcloth.  Apply CHG soap ONLY FROM YOUR NECK DOWN TO YOUR TOES (washing for 3-5 minutes)  DO NOT use CHG soap on face, private areas, open wounds, or sores.  Pay special attention to the area where your surgery is being performed.  If you are having back surgery, having someone wash your back for you may be helpful. Wait 2 minutes after CHG soap is applied, then you may rinse off the CHG soap.  Pat dry with a clean towel  Put on clean clothes/pajamas   If you choose to wear lotion, please use ONLY the CHG-compatible lotions on the back of this paper.     Additional instructions for the day of surgery: DO NOT APPLY  any lotions, deodorants, cologne, or perfumes.   Put on clean/comfortable clothes.  Brush your teeth.  Ask your nurse before applying any prescription medications to the skin.      CHG Compatible Lotions   Aveeno Moisturizing lotion  Cetaphil Moisturizing Cream  Cetaphil Moisturizing Lotion  Clairol Herbal Essence Moisturizing Lotion, Dry Skin  Clairol Herbal Essence Moisturizing Lotion, Extra Dry Skin  Clairol Herbal Essence Moisturizing Lotion, Normal Skin  Curel Age Defying Therapeutic Moisturizing Lotion with Alpha Hydroxy  Curel Extreme Care Body Lotion   Curel Soothing Hands Moisturizing Hand Lotion  Curel Therapeutic Moisturizing Cream, Fragrance-Free  Curel Therapeutic Moisturizing Lotion, Fragrance-Free  Curel Therapeutic Moisturizing Lotion, Original Formula  Eucerin Daily Replenishing Lotion  Eucerin Dry Skin Therapy Plus Alpha Hydroxy Crme  Eucerin Dry Skin Therapy Plus Alpha Hydroxy Lotion  Eucerin Original Crme  Eucerin Original Lotion  Eucerin Plus Crme Eucerin Plus Lotion  Eucerin TriLipid Replenishing Lotion  Keri Anti-Bacterial Hand Lotion  Keri Deep Conditioning Original Lotion Dry Skin Formula Softly Scented  Keri Deep Conditioning Original Lotion, Fragrance Free Sensitive Skin Formula  Keri Lotion Fast Absorbing Fragrance Free Sensitive Skin Formula  Keri Lotion Fast Absorbing Softly Scented Dry Skin Formula  Keri Original Lotion  Keri Skin Renewal Lotion Keri Silky Smooth Lotion  Keri Silky Smooth Sensitive Skin Lotion  Nivea Body Creamy Conditioning Oil  Nivea Body Extra Enriched Teacher, adult education Moisturizing Lotion Nivea Crme  Nivea Skin Firming Lotion  NutraDerm 30 Skin Lotion  NutraDerm Skin Lotion  NutraDerm Therapeutic Skin Cream  NutraDerm Therapeutic Skin Lotion  ProShield Protective Hand Cream  Provon moisturizing lotion         Do not wear jewelry or makeup. Do not wear lotions, powders, perfumes/cologne or deodorant. Do not shave 48 hours prior to surgery.  Men may shave face and neck. Do not bring valuables to the hospital. Do not wear nail polish, gel polish, artificial nails, or any other type of covering on natural nails (fingers and toes) If you have artificial nails or gel coating that need to be removed by a nail salon, please have this removed prior to surgery. Artificial nails or gel coating may interfere with anesthesia's ability to adequately monitor your vital signs.  [facility] is not responsible for any belongings or valuables.    Do NOT Smoke  (Tobacco/Vaping)  24 hours prior to your procedure  If you use a CPAP at night, you may bring your mask for your overnight stay.   Contacts, glasses, hearing aids, dentures or partials may not be worn into surgery, please bring cases for these belongings   For patients admitted to the hospital, discharge time will be determined by your treatment team.   Patients discharged the day of surgery will not be allowed to drive home, and someone needs to stay with them for 24 hours.   SURGICAL WAITING ROOM VISITATION Patients having surgery or a procedure may have no more than 2 support people in the waiting area - these visitors may rotate.   Children under the age of 81 must have an adult with them who is not the patient. If the patient needs to stay at the hospital during part of their recovery, the visitor guidelines for inpatient rooms apply. Pre-op nurse will coordinate an appropriate time for 1 support person to accompany patient in pre-op.  This support person may not rotate.   Please refer to [email or web address] for the visitor guidelines for Inpatients (  after your surgery is over and you are in a regular room).    Special instructions:    Oral Hygiene is also important to reduce your risk of infection.  Remember - BRUSH YOUR TEETH THE MORNING OF SURGERY WITH YOUR REGULAR TOOTHPASTE   [facility]- Preparing For Surgery     If you received a COVID test during your pre-op visit, it is requested that you wear a mask when out in public, stay away from anyone that may not be feeling well, and notify your surgeon if you develop symptoms. If you have been in contact with anyone that has tested positive in the last 10 days, please notify your surgeon.    Please read over the following fact sheets that you were given.

## 2022-07-16 ENCOUNTER — Encounter (HOSPITAL_COMMUNITY): Payer: Self-pay

## 2022-07-16 ENCOUNTER — Encounter (HOSPITAL_COMMUNITY)
Admission: RE | Admit: 2022-07-16 | Discharge: 2022-07-16 | Disposition: A | Discharge status: Home / Self Care | Payer: Medicare HMO | Source: Ambulatory Visit | Attending: Neurological Surgery | Admitting: Neurological Surgery

## 2022-07-16 ENCOUNTER — Other Ambulatory Visit: Payer: Self-pay

## 2022-07-16 VITALS — BP 131/89 | HR 80 | Temp 98.0°F | Resp 17 | Ht 68.0 in | Wt 171.0 lb

## 2022-07-16 DIAGNOSIS — Z01818 Encounter for other preprocedural examination: Secondary | ICD-10-CM | POA: Diagnosis present

## 2022-07-16 LAB — BASIC METABOLIC PANEL
Anion gap: 7 (ref 5–15)
BUN: 11 mg/dL (ref 6–20)
CO2: 27 mmol/L (ref 22–32)
Calcium: 8.9 mg/dL (ref 8.9–10.3)
Chloride: 104 mmol/L (ref 98–111)
Creatinine, Ser: 1.06 mg/dL (ref 0.61–1.24)
GFR, Estimated: >60 mL/min (ref >60)
Glucose, Bld: 86 mg/dL (ref 70–99)
Potassium: 3.8 mmol/L (ref 3.5–5.1)
Sodium: 138 mmol/L (ref 135–145)

## 2022-07-16 LAB — CBC
HCT: 38.5 % — ABNORMAL LOW (ref 39.0–52.0)
Hemoglobin: 12.6 g/dL — ABNORMAL LOW (ref 13.0–17.0)
MCH: 29.5 pg (ref 26.0–34.0)
MCHC: 32.7 g/dL (ref 30.0–36.0)
MCV: 90.2 fL (ref 80.0–100.0)
Platelets: 233 10*3/uL (ref 150–400)
RBC: 4.27 MIL/uL (ref 4.22–5.81)
RDW: 13.8 % (ref 11.5–15.5)
WBC: 7.7 10*3/uL (ref 4.0–10.5)
nRBC: 0 % (ref 0.0–0.2)

## 2022-07-16 LAB — SURGICAL PCR SCREEN
MRSA, PCR: NEGATIVE
Staphylococcus aureus: NEGATIVE

## 2022-07-16 NOTE — Progress Notes (Addendum)
PCP - Dr. Wilfred Curtis Cardiologist -denies   PPM/ICD - denies Device Orders -  Rep Notified -   Chest x-ray - denies EKG - 07/16/22 Stress Test -none  ECHO - none Cardiac Cath -none   Sleep Study -none  CPAP - no  Fasting Blood Sugar - na Checks Blood Sugar _____ times a day  Last dose of GLP1 agonist-  na GLP1 instructions: na  Blood Thinner Instructions:na Aspirin Instructions:na  ERAS Protcol - clears until 0430 PRE-SURGERY Ensure or G2-   COVID TEST- na   Anesthesia review: no  Patient denies shortness of breath, fever, cough and chest pain at PAT appointment   All instructions explained to the patient, with a verbal understanding of the material. Patient agrees to go over the instructions while at home for a better understanding. Patient also instructed to wear a mask when out in public prior to surgery. The opportunity to ask questions was provided.

## 2022-07-25 NOTE — Anesthesia Preprocedure Evaluation (Addendum)
Anesthesia Evaluation  Patient identified by MRN, date of birth, ID band Patient awake    Reviewed: Allergy & Precautions, NPO status , Patient's Chart, lab work & pertinent test results  Airway Mallampati: II  TM Distance: >3 FB Neck ROM: Limited    Dental  (+) Teeth Intact, Dental Advisory Given   Pulmonary Patient abstained from smoking., former smoker   Pulmonary exam normal breath sounds clear to auscultation       Cardiovascular hypertension, Pt. on medications Normal cardiovascular exam Rhythm:Regular Rate:Normal     Neuro/Psych  PSYCHIATRIC DISORDERS Anxiety Depression    S/p ACDF  Neuromuscular disease    GI/Hepatic negative GI ROS, Neg liver ROS,,,  Endo/Other  negative endocrine ROS    Renal/GU negative Renal ROS     Musculoskeletal  (+) Arthritis ,    Abdominal   Peds  Hematology negative hematology ROS (+)   Anesthesia Other Findings   Reproductive/Obstetrics                             Anesthesia Physical Anesthesia Plan  ASA: 2  Anesthesia Plan: General   Post-op Pain Management: Tylenol PO (pre-op)*   Induction: Intravenous  PONV Risk Score and Plan: 2 and Midazolam, Dexamethasone and Ondansetron  Airway Management Planned: Oral ETT and Video Laryngoscope Planned  Additional Equipment:   Intra-op Plan:   Post-operative Plan: Extubation in OR  Informed Consent: I have reviewed the patients History and Physical, chart, labs and discussed the procedure including the risks, benefits and alternatives for the proposed anesthesia with the patient or authorized representative who has indicated his/her understanding and acceptance.     Dental advisory given  Plan Discussed with: CRNA  Anesthesia Plan Comments:        Anesthesia Quick Evaluation

## 2022-07-26 ENCOUNTER — Other Ambulatory Visit: Payer: Self-pay

## 2022-07-26 ENCOUNTER — Ambulatory Visit (HOSPITAL_COMMUNITY): Payer: Medicare HMO

## 2022-07-26 ENCOUNTER — Encounter (HOSPITAL_COMMUNITY): Payer: Self-pay | Admitting: Neurological Surgery

## 2022-07-26 ENCOUNTER — Observation Stay (HOSPITAL_COMMUNITY)
Admission: RE | Admit: 2022-07-26 | Discharge: 2022-07-27 | Disposition: A | Discharge status: Home / Self Care | Payer: Medicare HMO | Attending: Neurological Surgery | Admitting: Neurological Surgery

## 2022-07-26 ENCOUNTER — Encounter (HOSPITAL_COMMUNITY)
Admission: RE | Disposition: A | Discharge status: Home / Self Care | Payer: Self-pay | Source: Home / Self Care | Attending: Neurological Surgery

## 2022-07-26 ENCOUNTER — Ambulatory Visit (HOSPITAL_BASED_OUTPATIENT_CLINIC_OR_DEPARTMENT_OTHER): Payer: Medicare HMO

## 2022-07-26 DIAGNOSIS — Z09 Encounter for follow-up examination after completed treatment for conditions other than malignant neoplasm: Secondary | ICD-10-CM | POA: Diagnosis not present

## 2022-07-26 DIAGNOSIS — Z87891 Personal history of nicotine dependence: Secondary | ICD-10-CM | POA: Diagnosis not present

## 2022-07-26 DIAGNOSIS — F419 Anxiety disorder, unspecified: Secondary | ICD-10-CM | POA: Diagnosis not present

## 2022-07-26 DIAGNOSIS — M4712 Other spondylosis with myelopathy, cervical region: Secondary | ICD-10-CM | POA: Diagnosis not present

## 2022-07-26 DIAGNOSIS — Z79899 Other long term (current) drug therapy: Secondary | ICD-10-CM | POA: Diagnosis not present

## 2022-07-26 DIAGNOSIS — F32A Depression, unspecified: Secondary | ICD-10-CM | POA: Diagnosis not present

## 2022-07-26 DIAGNOSIS — M4802 Spinal stenosis, cervical region: Secondary | ICD-10-CM | POA: Insufficient documentation

## 2022-07-26 DIAGNOSIS — M4722 Other spondylosis with radiculopathy, cervical region: Principal | ICD-10-CM | POA: Insufficient documentation

## 2022-07-26 DIAGNOSIS — M50123 Cervical disc disorder at C6-C7 level with radiculopathy: Secondary | ICD-10-CM | POA: Diagnosis not present

## 2022-07-26 DIAGNOSIS — I10 Essential (primary) hypertension: Secondary | ICD-10-CM | POA: Diagnosis not present

## 2022-07-26 DIAGNOSIS — M960 Pseudarthrosis after fusion or arthrodesis: Secondary | ICD-10-CM | POA: Diagnosis not present

## 2022-07-26 DIAGNOSIS — Z981 Arthrodesis status: Principal | ICD-10-CM

## 2022-07-26 HISTORY — PX: ANTERIOR CERVICAL DECOMP/DISCECTOMY FUSION: SHX1161

## 2022-07-26 SURGERY — ANTERIOR CERVICAL DECOMPRESSION/DISCECTOMY FUSION 1 LEVEL
Anesthesia: General | Site: Spine Cervical

## 2022-07-26 MED ORDER — PROPOFOL 10 MG/ML IV BOLUS
INTRAVENOUS | Status: AC
  Filled 2022-07-26: qty 20

## 2022-07-26 MED ORDER — POTASSIUM CHLORIDE IN NACL 20-0.9 MEQ/L-% IV SOLN: INTRAVENOUS | Status: DC

## 2022-07-26 MED ORDER — ONDANSETRON HCL 4 MG/2ML IJ SOLN
INTRAMUSCULAR | Status: AC
  Filled 2022-07-26: qty 2

## 2022-07-26 MED ORDER — SODIUM CHLORIDE 0.9% FLUSH: 3.0000 mL | INTRAVENOUS | Status: DC | PRN

## 2022-07-26 MED ORDER — DEXAMETHASONE SODIUM PHOSPHATE 10 MG/ML IJ SOLN
INTRAMUSCULAR | Status: DC | PRN
  Administered 2022-07-26: 10 mg via INTRAVENOUS

## 2022-07-26 MED ORDER — ALPRAZOLAM 0.5 MG PO TABS
0.5000 mg | ORAL_TABLET | Freq: Every evening | ORAL | Status: DC | PRN
  Administered 2022-07-27: 0.5 mg via ORAL
  Filled 2022-07-26: qty 1

## 2022-07-26 MED ORDER — ONDANSETRON HCL 4 MG/2ML IJ SOLN: 4.0000 mg | Freq: Four times a day (QID) | INTRAMUSCULAR | Status: DC | PRN

## 2022-07-26 MED ORDER — KETAMINE HCL 50 MG/ML IJ SOLN
INTRAMUSCULAR | Status: DC | PRN
  Administered 2022-07-26: 30 mg via INTRAMUSCULAR

## 2022-07-26 MED ORDER — HYDROMORPHONE HCL 1 MG/ML IJ SOLN
0.2500 mg | INTRAMUSCULAR | Status: DC | PRN
  Administered 2022-07-26 (×4): 0.5 mg via INTRAVENOUS

## 2022-07-26 MED ORDER — AMLODIPINE BESYLATE 5 MG PO TABS: 5.0000 mg | ORAL_TABLET | Freq: Every day | ORAL | Status: DC | PRN

## 2022-07-26 MED ORDER — METHOCARBAMOL 500 MG PO TABS
500.0000 mg | ORAL_TABLET | Freq: Four times a day (QID) | ORAL | Status: DC | PRN
  Administered 2022-07-26 – 2022-07-27 (×3): 500 mg via ORAL
  Filled 2022-07-26 (×3): qty 1

## 2022-07-26 MED ORDER — PHENOL 1.4 % MT LIQD: 1.0000 | OROMUCOSAL | Status: DC | PRN

## 2022-07-26 MED ORDER — ORAL CARE MOUTH RINSE: 15.0000 mL | Freq: Once | OROMUCOSAL | Status: AC

## 2022-07-26 MED ORDER — DOCUSATE SODIUM 100 MG PO CAPS
100.0000 mg | ORAL_CAPSULE | Freq: Every day | ORAL | Status: DC | PRN
  Administered 2022-07-26: 100 mg via ORAL
  Filled 2022-07-26: qty 1

## 2022-07-26 MED ORDER — PHENYLEPHRINE 80 MCG/ML (10ML) SYRINGE FOR IV PUSH (FOR BLOOD PRESSURE SUPPORT)
PREFILLED_SYRINGE | INTRAVENOUS | Status: DC | PRN
  Administered 2022-07-26: 80 ug via INTRAVENOUS

## 2022-07-26 MED ORDER — MINOCYCLINE HCL 100 MG PO CAPS: 100.0000 mg | ORAL_CAPSULE | Freq: Two times a day (BID) | ORAL | Status: DC

## 2022-07-26 MED ORDER — KETAMINE HCL 50 MG/5ML IJ SOSY
PREFILLED_SYRINGE | INTRAMUSCULAR | Status: AC
  Filled 2022-07-26: qty 5

## 2022-07-26 MED ORDER — HYDROMORPHONE HCL 1 MG/ML IJ SOLN
INTRAMUSCULAR | Status: AC
  Filled 2022-07-26: qty 1

## 2022-07-26 MED ORDER — CEFAZOLIN SODIUM-DEXTROSE 2-4 GM/100ML-% IV SOLN
2.0000 g | Freq: Three times a day (TID) | INTRAVENOUS | Status: AC
  Administered 2022-07-26 – 2022-07-27 (×2): 2 g via INTRAVENOUS
  Filled 2022-07-26 (×2): qty 100

## 2022-07-26 MED ORDER — PROPOFOL 1000 MG/100ML IV EMUL
INTRAVENOUS | Status: AC
  Filled 2022-07-26: qty 200

## 2022-07-26 MED ORDER — BUPIVACAINE HCL (PF) 0.25 % IJ SOLN
INTRAMUSCULAR | Status: AC
  Filled 2022-07-26: qty 30

## 2022-07-26 MED ORDER — SODIUM CHLORIDE 0.9 % IV SOLN
250.0000 mL | INTRAVENOUS | Status: DC
  Administered 2022-07-26: 250 mL via INTRAVENOUS

## 2022-07-26 MED ORDER — ARIPIPRAZOLE 5 MG PO TABS
5.0000 mg | ORAL_TABLET | Freq: Every day | ORAL | Status: DC
  Administered 2022-07-26: 5 mg via ORAL
  Filled 2022-07-26: qty 1

## 2022-07-26 MED ORDER — CEFAZOLIN SODIUM-DEXTROSE 2-3 GM-%(50ML) IV SOLR
INTRAVENOUS | Status: DC | PRN
  Administered 2022-07-26: 2 g via INTRAVENOUS

## 2022-07-26 MED ORDER — MIDAZOLAM HCL 2 MG/2ML IJ SOLN
INTRAMUSCULAR | Status: AC
  Filled 2022-07-26: qty 2

## 2022-07-26 MED ORDER — ROCURONIUM BROMIDE 10 MG/ML (PF) SYRINGE
PREFILLED_SYRINGE | INTRAVENOUS | Status: DC | PRN
  Administered 2022-07-26: 60 mg via INTRAVENOUS
  Administered 2022-07-26: 10 mg via INTRAVENOUS

## 2022-07-26 MED ORDER — ACETAMINOPHEN 500 MG PO TABS
ORAL_TABLET | ORAL | Status: AC
  Filled 2022-07-26: qty 2

## 2022-07-26 MED ORDER — PHENYLEPHRINE HCL-NACL 20-0.9 MG/250ML-% IV SOLN
INTRAVENOUS | Status: DC | PRN
  Administered 2022-07-26: 30 ug/min via INTRAVENOUS

## 2022-07-26 MED ORDER — PHENYLEPHRINE HCL-NACL 20-0.9 MG/250ML-% IV SOLN
INTRAVENOUS | Status: AC
  Filled 2022-07-26: qty 750

## 2022-07-26 MED ORDER — OXYCODONE HCL 5 MG PO TABS
5.0000 mg | ORAL_TABLET | ORAL | Status: DC | PRN
  Administered 2022-07-26 – 2022-07-27 (×6): 5 mg via ORAL
  Filled 2022-07-26 (×6): qty 1

## 2022-07-26 MED ORDER — HYDROCHLOROTHIAZIDE 25 MG PO TABS
25.0000 mg | ORAL_TABLET | Freq: Every day | ORAL | Status: DC
  Administered 2022-07-27: 25 mg via ORAL
  Filled 2022-07-26: qty 1

## 2022-07-26 MED ORDER — DEXAMETHASONE SODIUM PHOSPHATE 10 MG/ML IJ SOLN
INTRAMUSCULAR | Status: AC
  Filled 2022-07-26: qty 1

## 2022-07-26 MED ORDER — THROMBIN 5000 UNITS EX SOLR: OROMUCOSAL | Status: DC | PRN

## 2022-07-26 MED ORDER — ACETAMINOPHEN 325 MG PO TABS: 650.0000 mg | ORAL_TABLET | ORAL | Status: DC | PRN

## 2022-07-26 MED ORDER — BUPROPION HCL ER (XL) 300 MG PO TB24: 300.0000 mg | ORAL_TABLET | Freq: Every day | ORAL | Status: DC

## 2022-07-26 MED ORDER — SODIUM CHLORIDE 0.9% FLUSH
3.0000 mL | Freq: Two times a day (BID) | INTRAVENOUS | Status: DC
  Administered 2022-07-26: 3 mL via INTRAVENOUS

## 2022-07-26 MED ORDER — MIDAZOLAM HCL 2 MG/2ML IJ SOLN
INTRAMUSCULAR | Status: DC | PRN
  Administered 2022-07-26: 2 mg via INTRAVENOUS

## 2022-07-26 MED ORDER — ACETAMINOPHEN 650 MG RE SUPP: 650.0000 mg | RECTAL | Status: DC | PRN

## 2022-07-26 MED ORDER — ONDANSETRON HCL 4 MG PO TABS: 4.0000 mg | ORAL_TABLET | Freq: Four times a day (QID) | ORAL | Status: DC | PRN

## 2022-07-26 MED ORDER — AMISULPRIDE (ANTIEMETIC) 5 MG/2ML IV SOLN: 10.0000 mg | Freq: Once | INTRAVENOUS | Status: DC | PRN

## 2022-07-26 MED ORDER — LIDOCAINE 2% (20 MG/ML) 5 ML SYRINGE
INTRAMUSCULAR | Status: AC
  Filled 2022-07-26: qty 5

## 2022-07-26 MED ORDER — LIDOCAINE 2% (20 MG/ML) 5 ML SYRINGE
INTRAMUSCULAR | Status: DC | PRN
  Administered 2022-07-26: 80 mg via INTRAVENOUS

## 2022-07-26 MED ORDER — PROPOFOL 10 MG/ML IV BOLUS
INTRAVENOUS | Status: DC | PRN
  Administered 2022-07-26: 170 mg via INTRAVENOUS

## 2022-07-26 MED ORDER — FENTANYL CITRATE (PF) 250 MCG/5ML IJ SOLN
INTRAMUSCULAR | Status: AC
  Filled 2022-07-26: qty 5

## 2022-07-26 MED ORDER — MENTHOL 3 MG MT LOZG
1.0000 | LOZENGE | OROMUCOSAL | Status: DC | PRN
  Filled 2022-07-26: qty 9

## 2022-07-26 MED ORDER — ONDANSETRON HCL 4 MG/2ML IJ SOLN: 4.0000 mg | Freq: Once | INTRAMUSCULAR | Status: DC | PRN

## 2022-07-26 MED ORDER — ROCURONIUM BROMIDE 10 MG/ML (PF) SYRINGE
PREFILLED_SYRINGE | INTRAVENOUS | Status: AC
  Filled 2022-07-26: qty 10

## 2022-07-26 MED ORDER — FENTANYL CITRATE (PF) 250 MCG/5ML IJ SOLN
INTRAMUSCULAR | Status: DC | PRN
  Administered 2022-07-26: 100 ug via INTRAVENOUS
  Administered 2022-07-26 (×3): 50 ug via INTRAVENOUS

## 2022-07-26 MED ORDER — ONDANSETRON HCL 4 MG/2ML IJ SOLN
INTRAMUSCULAR | Status: DC | PRN
  Administered 2022-07-26: 4 mg via INTRAVENOUS

## 2022-07-26 MED ORDER — MORPHINE SULFATE ER 15 MG PO TBCR: 15.0000 mg | EXTENDED_RELEASE_TABLET | Freq: Two times a day (BID) | ORAL | Status: DC

## 2022-07-26 MED ORDER — LACTATED RINGERS IV SOLN: INTRAVENOUS | Status: DC

## 2022-07-26 MED ORDER — OXYCODONE-ACETAMINOPHEN 10-325 MG PO TABS: 1.0000 | ORAL_TABLET | ORAL | Status: DC | PRN

## 2022-07-26 MED ORDER — BUPIVACAINE HCL (PF) 0.25 % IJ SOLN
INTRAMUSCULAR | Status: DC | PRN
  Administered 2022-07-26: 7 mL

## 2022-07-26 MED ORDER — MORPHINE SULFATE (PF) 2 MG/ML IV SOLN: 2.0000 mg | INTRAVENOUS | Status: DC | PRN

## 2022-07-26 MED ORDER — CHLORHEXIDINE GLUCONATE 0.12 % MT SOLN
OROMUCOSAL | Status: AC
  Filled 2022-07-26: qty 15

## 2022-07-26 MED ORDER — BUPROPION HCL ER (XL) 300 MG PO TB24
450.0000 mg | ORAL_TABLET | Freq: Every day | ORAL | Status: DC
  Administered 2022-07-27: 450 mg via ORAL
  Filled 2022-07-26: qty 1

## 2022-07-26 MED ORDER — THROMBIN 5000 UNITS EX SOLR
CUTANEOUS | Status: AC
  Filled 2022-07-26: qty 5000

## 2022-07-26 MED ORDER — SUGAMMADEX SODIUM 200 MG/2ML IV SOLN
INTRAVENOUS | Status: DC | PRN
  Administered 2022-07-26: 154.2 mg via INTRAVENOUS

## 2022-07-26 MED ORDER — CHLORHEXIDINE GLUCONATE 0.12 % MT SOLN
15.0000 mL | Freq: Once | OROMUCOSAL | Status: AC
  Administered 2022-07-26: 15 mL via OROMUCOSAL

## 2022-07-26 MED ORDER — LISINOPRIL 20 MG PO TABS
20.0000 mg | ORAL_TABLET | Freq: Every day | ORAL | Status: DC
  Administered 2022-07-27: 20 mg via ORAL
  Filled 2022-07-26: qty 1

## 2022-07-26 MED ORDER — LISINOPRIL-HYDROCHLOROTHIAZIDE 20-25 MG PO TABS: 1.0000 | ORAL_TABLET | Freq: Every day | ORAL | Status: DC

## 2022-07-26 MED ORDER — 0.9 % SODIUM CHLORIDE (POUR BTL) OPTIME
TOPICAL | Status: DC | PRN
  Administered 2022-07-26: 1000 mL

## 2022-07-26 MED ORDER — OXYCODONE-ACETAMINOPHEN 5-325 MG PO TABS
1.0000 | ORAL_TABLET | ORAL | Status: DC | PRN
  Administered 2022-07-26 – 2022-07-27 (×6): 1 via ORAL
  Filled 2022-07-26 (×6): qty 1

## 2022-07-26 MED ORDER — CEFAZOLIN SODIUM-DEXTROSE 2-4 GM/100ML-% IV SOLN
INTRAVENOUS | Status: AC
  Filled 2022-07-26: qty 100

## 2022-07-26 MED ORDER — ACETAMINOPHEN 500 MG PO TABS
1000.0000 mg | ORAL_TABLET | Freq: Once | ORAL | Status: AC
  Administered 2022-07-26: 500 mg via ORAL

## 2022-07-26 SURGICAL SUPPLY — 48 items
APL SKNCLS STERI-STRIP NONHPOA (GAUZE/BANDAGES/DRESSINGS) ×1
BAG COUNTER SPONGE SURGICOUNT (BAG) ×1 IMPLANT
BAG SPNG CNTER NS LX DISP (BAG) ×1
BASKET BONE COLLECTION (BASKET) IMPLANT
BENZOIN TINCTURE PRP APPL 2/3 (GAUZE/BANDAGES/DRESSINGS) ×1 IMPLANT
BUR CARBIDE MATCH 3.0 (BURR) ×1 IMPLANT
CANISTER SUCT 3000ML PPV (MISCELLANEOUS) ×1 IMPLANT
DRAPE C-ARM 42X72 X-RAY (DRAPES) ×2 IMPLANT
DRAPE LAPAROTOMY 100X72 PEDS (DRAPES) ×1 IMPLANT
DRAPE MICROSCOPE SLANT 54X150 (MISCELLANEOUS) ×1 IMPLANT
DRSG OPSITE POSTOP 3X4 (GAUZE/BANDAGES/DRESSINGS) IMPLANT
DURAPREP 6ML APPLICATOR 50/CS (WOUND CARE) ×1 IMPLANT
ELECT COATED BLADE 2.86 ST (ELECTRODE) ×1 IMPLANT
ELECT REM PT RETURN 9FT ADLT (ELECTROSURGICAL) ×1
ELECTRODE REM PT RTRN 9FT ADLT (ELECTROSURGICAL) ×1 IMPLANT
GAUZE 4X4 16PLY ~~LOC~~+RFID DBL (SPONGE) IMPLANT
GLOVE BIO SURGEON STRL SZ7 (GLOVE) IMPLANT
GLOVE BIO SURGEON STRL SZ8 (GLOVE) ×1 IMPLANT
GLOVE BIOGEL PI IND STRL 7.0 (GLOVE) IMPLANT
GOWN STRL REUS W/ TWL LRG LVL3 (GOWN DISPOSABLE) IMPLANT
GOWN STRL REUS W/ TWL XL LVL3 (GOWN DISPOSABLE) IMPLANT
GOWN STRL REUS W/TWL 2XL LVL3 (GOWN DISPOSABLE) ×1 IMPLANT
GOWN STRL REUS W/TWL LRG LVL3 (GOWN DISPOSABLE)
GOWN STRL REUS W/TWL XL LVL3 (GOWN DISPOSABLE)
HEMOSTAT POWDER KIT SURGIFOAM (HEMOSTASIS) ×1 IMPLANT
KIT BASIN OR (CUSTOM PROCEDURE TRAY) ×1 IMPLANT
KIT TURNOVER KIT B (KITS) ×1 IMPLANT
NDL HYPO 25X1 1.5 SAFETY (NEEDLE) ×1 IMPLANT
NDL SPNL 20GX3.5 QUINCKE YW (NEEDLE) ×1 IMPLANT
NEEDLE HYPO 25X1 1.5 SAFETY (NEEDLE) ×1 IMPLANT
NEEDLE SPNL 20GX3.5 QUINCKE YW (NEEDLE) ×1 IMPLANT
NS IRRIG 1000ML POUR BTL (IV SOLUTION) ×1 IMPLANT
PACK LAMINECTOMY NEURO (CUSTOM PROCEDURE TRAY) ×1 IMPLANT
PAD ARMBOARD 7.5X6 YLW CONV (MISCELLANEOUS) ×1 IMPLANT
PIN DISTRACTION 14MM (PIN) IMPLANT
PLATE LOCK INSIGNIA 24 1L (Plate) IMPLANT
SCREW VA SINGLE LEAD 4X16 (Screw) IMPLANT
SOL ELECTROSURG ANTI STICK (MISCELLANEOUS) ×1
SOLUTION ELECTROSURG ANTI STCK (MISCELLANEOUS) ×1 IMPLANT
SPACER ASSEM CERV LORD 7M (Spacer) IMPLANT
SPONGE INTESTINAL PEANUT (DISPOSABLE) ×1 IMPLANT
SPONGE SURGIFOAM ABS GEL SZ50 (HEMOSTASIS) IMPLANT
STRIP CLOSURE SKIN 1/2X4 (GAUZE/BANDAGES/DRESSINGS) ×1 IMPLANT
SUT VIC AB 3-0 SH 8-18 (SUTURE) ×1 IMPLANT
SUT VICRYL 4-0 PS2 18IN ABS (SUTURE) IMPLANT
TOWEL GREEN STERILE (TOWEL DISPOSABLE) ×1 IMPLANT
TOWEL GREEN STERILE FF (TOWEL DISPOSABLE) ×1 IMPLANT
WATER STERILE IRR 1000ML POUR (IV SOLUTION) ×1 IMPLANT

## 2022-07-26 NOTE — H&P (Signed)
Subjective:   Patient is a 57 y.o. male admitted for neck pain. The patient first presented to me with complaints of neck pain and arm pain. Onset of symptoms was several months ago. The pain is described as aching and occurs all day. The pain is rated severe, and is located in the neck and radiates to the arms. The symptoms have been progressive. Symptoms are exacerbated by extending head backwards, and are relieved by none.  Previous work up includes MRI of cervical spine, results: spondylosis with pseudoarthrosis C6-7.  Past Medical History:  Diagnosis Date   Anxiety    Arthritis    Benign prostatic hyperplasia with lower urinary tract symptoms    Carpal tunnel syndrome, right upper limb    Chronic back pain    Depression    Essential (primary) hypertension    Hemorrhoids    Hyperplastic colon polyp    Hypertension    Low back pain    Major depressive disorder, single episode, unspecified    Opioid dependence (HCC)    Pre-diabetes    Tubular adenoma    Unspecified osteoarthritis, unspecified site     Past Surgical History:  Procedure Laterality Date   CARPAL TUNNEL RELEASE Right 02/17/2018   Procedure: CARPAL TUNNEL RELEASE;  Surgeon: Cindee Salt, MD;  Location: [facility];  Service: Orthopedics;  Laterality: Right;   CERVICAL SPINE SURGERY  2009   COLONOSCOPY     EVALUATION UNDER ANESTHESIA WITH HEMORRHOIDECTOMY  12/20/2011   FOOT SURGERY Right 2012   bone removed from the 5 th toe   INCISION AND DRAINAGE ABSCESS N/A 08/04/2020   Procedure: INCISION AND DRAINAGE ABSCESS;  Surgeon: Laren Boom, DO;  Location: [facility] OR;  Service: ENT;  Laterality: N/A;   NASAL ENDOSCOPY N/A 08/04/2020   Procedure: NASAL ENDOSCOPY;  Surgeon: Laren Boom, DO;  Location: [facility] OR;  Service: ENT;  Laterality: N/A;   POSTERIOR CERVICAL FUSION/FORAMINOTOMY N/A 01/03/2021   Procedure: Posterior cervical fusion with lateral mass fixation Cervical Three - Cervical seven;  Surgeon:  Tia Alert, MD;  Location: [facility] OR;  Service: Neurosurgery;  Laterality: N/A;   TOOTH EXTRACTION N/A 08/04/2020   Procedure: DENTAL EXTRACTION TOOTH #11;  Surgeon: Enis Slipper, DMD;  Location: [facility] OR;  Service: Dentistry;  Laterality: N/A;    No Known Allergies  Social History   Tobacco Use   Smoking status: Former    Current packs/day: 0.00    Average packs/day: 0.3 packs/day for 20.0 years (5.0 ttl pk-yrs)    Types: Cigarettes    Start date: 07/25/2000    Quit date: 07/25/2020    Years since quitting: 2.0   Smokeless tobacco: Never  Substance Use Topics   Alcohol use: No    Alcohol/week: 0.0 standard drinks of alcohol    Family History  Problem Relation Age of Onset   Hypertension Mother    Hypertension Father    Alcohol abuse Brother    Drug abuse Brother    Diabetes Neg Hx    Heart disease Neg Hx    Kidney disease Neg Hx    Liver disease Neg Hx    Prior to Admission medications   Medication Sig Start Date End Date Taking? Authorizing Provider  amLODipine (NORVASC) 5 MG tablet Take 5 mg by mouth daily as needed (blood pressure of 150 or higher). 01/27/13  Yes [provider]  buPROPion (WELLBUTRIN XL) 300 MG 24 hr tablet Take 300 mg by mouth daily. 05/18/16  Yes  [provider]  lisinopril-hydrochlorothiazide (ZESTORETIC) 20-25 MG tablet Take 1 tablet by mouth daily. 06/19/20  Yes [provider]  oxyCODONE-acetaminophen (PERCOCET) 10-325 MG tablet Take 1 tablet by mouth every 4 (four) hours as needed for pain. 01/04/21  Yes Meyran, Tiana Loft, NP  ALPRAZolam Prudy Feeler) 0.5 MG tablet Take 0.5 mg by mouth at bedtime as needed for sleep.  06/02/16   [provider]  ARIPiprazole (ABILIFY) 5 MG tablet Take 5 mg by mouth at bedtime. 03/20/20   [provider]  clindamycin (CLEOCIN T) 1 % external solution Apply 1 application topically 2 (two) times daily as needed (boils).  10/13/15   [provider]  clindamycin (CLEOCIN) 300  MG capsule Take 300 mg by mouth 2 (two) times daily as needed (boils).  10/12/15   [provider]  docusate sodium (COLACE) 100 MG capsule Take 100 mg by mouth daily as needed for moderate constipation.    [provider]  famotidine (PEPCID) 20 MG tablet Take 1 tablet (20 mg total) by mouth 2 (two) times daily for 5 days. Patient not taking: Reported on 12/27/2020 09/12/20 09/17/20  Sponseller, Eugene Gavia, PA-C  FLUoxetine (PROZAC) 20 MG capsule Take 20 mg by mouth daily. 07/31/20   [provider]  hydroxypropyl methylcellulose / hypromellose (ISOPTO TEARS / GONIOVISC) 2.5 % ophthalmic solution Place 1 drop into both eyes 3 (three) times daily as needed for dry eyes.    [provider]  methocarbamol (ROBAXIN) 500 MG tablet Take 1 tablet (500 mg total) by mouth every 6 (six) hours as needed for muscle spasms. 01/04/21   Arman Bogus, MD  methylPREDNISolone (MEDROL DOSEPAK) 4 MG TBPK tablet Take 24 mg on day 1, 20 mg on day 2, 16 mg on day 3, 12 mg on day 4, 8 mg on day 5, 4 mg on day 6. Patient not taking: Reported on 12/27/2020 10/15/20   Theadora Rama Scales, PA-C  minocycline (MINOCIN) 100 MG capsule Take 1 capsule (100 mg total) by mouth 2 (two) times daily. 01/07/21   Teressa Lower, PA-C  morphine (MS CONTIN) 15 MG 12 hr tablet Take 15 mg by mouth every 12 (twelve) hours. 07/14/20   [provider]     Review of Systems  Positive ROS: neg  All other systems have been reviewed and were otherwise negative with the exception of those mentioned in the HPI and as above.  Objective: Vital signs in last 24 hours: Temp:  [98.1 F (36.7 C)] 98.1 F (36.7 C) (07/12 0541) Pulse Rate:  [69] 69 (07/12 0541) Resp:  [17] 17 (07/12 0541) BP: (132)/(91) 132/91 (07/12 0541) SpO2:  [97 %] 97 % (07/12 0541) Weight:  [77.1 kg] 77.1 kg (07/12 0541)  General Appearance: Alert, cooperative, no distress, appears stated age Head: Normocephalic, without  obvious abnormality, atraumatic Eyes: PERRL, conjunctiva/corneas clear, EOM's intact      Neck: Supple, symmetrical, trachea midline, Back: Symmetric, no curvature, ROM normal, no CVA tenderness Lungs:  respirations unlabored Heart: Regular rate and rhythm Abdomen: Soft, non-tender Extremities: Extremities normal, atraumatic, no cyanosis or edema Pulses: 2+ and symmetric all extremities Skin: Skin color, texture, turgor normal, no rashes or lesions  NEUROLOGIC:  Mental status: Alert and oriented x4, no aphasia, good attention span, fund of knowledge and memory  Motor Exam - grossly normal Sensory Exam - grossly normal Reflexes: 1+ Coordination - grossly normal Gait - grossly normal Balance - grossly normal Cranial Nerves: I: smell Not tested  II: visual acuity  OS: nl    OD: nl  II: visual fields Full to confrontation  II: pupils Equal, round, reactive to light  III,VII: ptosis None  III,IV,VI: extraocular muscles  Full ROM  V: mastication Normal  V: facial light touch sensation  Normal  V,VII: corneal reflex  Present  VII: facial muscle function - upper  Normal  VII: facial muscle function - lower Normal  VIII: hearing Not tested  IX: soft palate elevation  Normal  IX,X: gag reflex Present  XI: trapezius strength  5/5  XI: sternocleidomastoid strength 5/5  XI: neck flexion strength  5/5  XII: tongue strength  Normal    Data Review Lab Results  Component Value Date   WBC 7.7 07/16/2022   HGB 12.6 (L) 07/16/2022   HCT 38.5 (L) 07/16/2022   MCV 90.2 07/16/2022   PLT 233 07/16/2022   Lab Results  Component Value Date   NA 138 07/16/2022   K 3.8 07/16/2022   CL 104 07/16/2022   CO2 27 07/16/2022   BUN 11 07/16/2022   CREATININE 1.06 07/16/2022   GLUCOSE 86 07/16/2022   Lab Results  Component Value Date   INR 1.0 01/01/2021    Assessment:   Cervical neck pain with herniated nucleus pulposus/ spondylosis/ stenosis at C6-7 with pseudoarthrosis. Estimated  body mass index is 25.85 kg/m as calculated from the following:   Height as of this encounter: 5\' 8"  (1.727 m).   Weight as of this encounter: 77.1 kg.  Patient has failed conservative therapy. Planned surgery : ACDF C6-7  Plan:   I explained the condition and procedure to the patient and answered any questions.  Patient wishes to proceed with procedure as planned. Understands risks/ benefits/ and expected or typical outcomes.  BURLON SAMA 07/26/2022 7:05 AM

## 2022-07-26 NOTE — Plan of Care (Signed)
  Problem: Skin Integrity: Goal: Risk for impaired skin integrity will decrease Outcome: Completed/Met   Problem: Education: Goal: Ability to verbalize activity precautions or restrictions will improve Outcome: Completed/Met Goal: Knowledge of the prescribed therapeutic regimen will improve Outcome: Completed/Met Goal: Understanding of discharge needs will improve Outcome: Completed/Met   Problem: Activity: Goal: Ability to avoid complications of mobility impairment will improve Outcome: Completed/Met Goal: Ability to tolerate increased activity will improve Outcome: Completed/Met Goal: Will remain free from falls Outcome: Completed/Met   Problem: Bowel/Gastric: Goal: Gastrointestinal status for postoperative course will improve Outcome: Completed/Met   Problem: Clinical Measurements: Goal: Ability to maintain clinical measurements within normal limits will improve Outcome: Completed/Met Goal: Postoperative complications will be avoided or minimized Outcome: Completed/Met Goal: Diagnostic test results will improve Outcome: Completed/Met   Problem: Pain Management: Goal: Pain level will decrease Outcome: Completed/Met   Problem: Skin Integrity: Goal: Will show signs of wound healing Outcome: Completed/Met   Problem: Health Behavior/Discharge Planning: Goal: Identification of resources available to assist in meeting health care needs will improve Outcome: Completed/Met   Problem: Bladder/Genitourinary: Goal: Urinary functional status for postoperative course will improve Outcome: Completed/Met   

## 2022-07-26 NOTE — Anesthesia Postprocedure Evaluation (Signed)
Anesthesia Post Note  Patient: [name]  Procedure(s) Performed: CERVICAL SIX-SEVEN ANTERIOR CERVICAL DECOMPRESSION/DISCECTOMY FUSION WITH REMOVAL OF CERVICAL FIVE-SIX ANTERIOR HARDWARE (Spine Cervical)     Patient location during evaluation: PACU Anesthesia Type: General Level of consciousness: awake and alert Pain management: pain level controlled Vital Signs Assessment: post-procedure vital signs reviewed and stable Respiratory status: spontaneous breathing, nonlabored ventilation, respiratory function stable and patient connected to nasal cannula oxygen Cardiovascular status: blood pressure returned to baseline and stable Postop Assessment: no apparent nausea or vomiting Anesthetic complications: no   No notable events documented.  Last Vitals:  Vitals:   07/26/22 1057 07/26/22 1558  BP: (!) 147/91 120/81  Pulse: 85 (!) 106  Resp: 20 20  Temp: 36.7 C 36.7 C  SpO2: 96% 98%    Last Pain:  Vitals:   07/26/22 1558  TempSrc: Oral  PainSc:                  Collene Schlichter

## 2022-07-26 NOTE — Transfer of Care (Cosign Needed)
Immediate Anesthesia Transfer of Care Note  Patient: [name]  Procedure(s) Performed: CERVICAL SIX-SEVEN ANTERIOR CERVICAL DECOMPRESSION/DISCECTOMY FUSION WITH REMOVAL OF CERVICAL FIVE-SIX ANTERIOR HARDWARE (Spine Cervical)  Patient Location: PACU  Anesthesia Type:General  Level of Consciousness: drowsy and responds to stimulation  Airway & Oxygen Therapy: Patient Spontanous Breathing and Patient connected to face mask oxygen  Post-op Assessment: Report given to RN and Post -op Vital signs reviewed and stable  Post vital signs: Reviewed and stable  Last Vitals:  Vitals Value Taken Time  BP 137/88 07/26/22 0933  Temp    Pulse 76 07/26/22 0936  Resp 15 07/26/22 0936  SpO2 100 % 07/26/22 0936  Vitals shown include unfiled device data.  Last Pain:  Vitals:   07/26/22 0625  TempSrc:   PainSc: 7       Patients Stated Pain Goal: 3 (07/26/22 8119)  Complications: No notable events documented.

## 2022-07-26 NOTE — Anesthesia Procedure Notes (Signed)
Procedure Name: Intubation Date/Time: 07/26/2022 7:55 AM  Performed by: Carolynne Edouard, RNPre-anesthesia Checklist: Patient identified, Emergency Drugs available, Suction available and Patient being monitored Patient Re-evaluated:Patient Re-evaluated prior to induction Oxygen Delivery Method: Circle system utilized Preoxygenation: Pre-oxygenation with 100% oxygen Induction Type: IV induction Ventilation: Mask ventilation without difficulty and Oral airway inserted - appropriate to patient size Laryngoscope Size: Glidescope and 4 Grade View: Grade I Tube type: Oral Tube size: 7.5 mm Number of attempts: 1 Airway Equipment and Method: Stylet, Oral airway and Video-laryngoscopy Placement Confirmation: ETT inserted through vocal cords under direct vision, positive ETCO2 and breath sounds checked- equal and bilateral Secured at: 23 cm Tube secured with: Tape Dental Injury: Teeth and Oropharynx as per pre-operative assessment

## 2022-07-26 NOTE — Op Note (Signed)
07/26/2022  9:40 AM  PATIENT:  Benjamin Solomon  57 y.o. male  PRE-OPERATIVE DIAGNOSIS: Pseudoarthrosis C6-7, cervical spondylosis C6-7, neck and arm pain  POST-OPERATIVE DIAGNOSIS:  same  PROCEDURE:  1. Decompressive anterior cervical discectomy C6-7, 2. Anterior cervical arthrodesis C6-7 utilizing a 7 mm structural allograft bone graft, 3. Anterior cervical plating C6-7 utilizing a atec plate  SURGEON:  Marikay Alar, MD  ASSISTANTS: Verlin Dike, FNP  ANESTHESIA:   General  EBL: 50 ml  Total I/O In: 1150 [I.V.:1100; IV Piggyback:50] Out: 50 [Blood:50]  BLOOD ADMINISTERED: none  DRAINS: none  SPECIMEN:  none  INDICATION FOR PROCEDURE: This patient presented with neck pain and arm pain. Imaging showed arthrosis at C6-7 with lucency around the posterior C7 lateral mass screws. The patient tried conservative measures without relief. Pain was debilitating. Recommended ACDF with plating. Patient understood the risks, benefits, and alternatives and potential outcomes and wished to proceed.  PROCEDURE DETAILS: Patient was brought to the operating room placed under general endotracheal anesthesia. Patient was placed in the supine position on the operating room table. The neck was prepped with Duraprep and draped in a sterile fashion.   Three cc of local anesthesia was injected and a transverse incision was made on the right side of the neck.  Dissection was carried down thru the subcutaneous tissue and the platysma was  elevated, opened, and undermined with Metzenbaum scissors.  Dissection was then carried out thru an avascular plane leaving the sternocleidomastoid carotid artery and jugular vein laterally and the trachea and esophagus medially with the assistance of my nurse practitioner.  The old plate was identified and a localizing x-ray was taken to identify C6-7. The C6-7 level was identified and all in the room agreed with the level.  We dissected the tissue off of the plate at  G3-8 and remove the plate.  I put Surgifoam in the screw holes.  The longus colli muscles were then elevated and the retractor was placed with the assistance of my nurse practitioner. The annulus C6-7 was incised and the disc space entered. Discectomy was performed with micro-curettes and pituitary rongeurs. I then used the high-speed drill to drill the endplates down to the level of the posterior longitudinal ligament. . The operating microscope was draped and brought into the field provided additional magnification, illumination and visualization. Discectomy was continued posteriorly thru the disc space. Posterior longitudinal ligament was opened with a nerve hook, and then removed along with disc herniation and osteophytes, decompressing the spinal canal and thecal sac. We then continued to remove osteophytic overgrowth and disc material decompressing the neural foramina and exiting nerve roots bilaterally. The scope was angled up and down to help decompress and undercut the vertebral bodies. Once the decompression was completed we could pass a nerve hook circumferentially to assure adequate decompression in the midline and in the neural foramina. So by both visualization and palpation we felt we had an adequate decompression of the neural elements. We then measured the height of the intravertebral disc space and selected a 7 millimeter structural allograft. It was then gently positioned in the intravertebral disc space(s) and countersunk. I then used a 24 mm ATEC plate and placed 16 mm variable angle screws into the vertebral bodies of each level and locked them into position. The wound was irrigated with bacitracin solution, checked for hemostasis which was established and confirmed. Once meticulous hemostasis was achieved, we then proceeded with closure with the assistance of my nurse practitioner. The platysma was closed  with interrupted 3-0 undyed Vicryl suture, the subcuticular layer was closed with  interrupted 3-0 undyed Vicryl suture. The skin edges were approximated with steristrips. The drapes were removed. A sterile dressing was applied. The patient was then awakened from general anesthesia and transferred to the recovery room in stable condition. At the end of the procedure all sponge, needle and instrument counts were correct.   PLAN OF CARE: Admit for overnight observation  PATIENT DISPOSITION:  PACU - hemodynamically stable.   Delay start of Pharmacological VTE agent (>24hrs) due to surgical blood loss or risk of bleeding:  yes

## 2022-07-27 ENCOUNTER — Encounter (HOSPITAL_COMMUNITY): Payer: Self-pay | Admitting: Neurological Surgery

## 2022-07-27 DIAGNOSIS — M4722 Other spondylosis with radiculopathy, cervical region: Secondary | ICD-10-CM | POA: Diagnosis not present

## 2022-07-27 MED ORDER — CYCLOBENZAPRINE HCL 10 MG PO TABS: 10.0000 mg | ORAL_TABLET | Freq: Three times a day (TID) | ORAL | 0 refills | Status: AC | PRN

## 2022-07-27 NOTE — Progress Notes (Signed)
Neurosurgery Service Progress Note  Subjective: No acute events overnight. Reports resolution of pre-op UE radicular pain. Cervical pain as expected. No dysphagia.    Objective: Vitals:   07/26/22 2000 07/27/22 0007 07/27/22 0456 07/27/22 0730  BP: 113/76 121/82 118/84 113/76  Pulse: 94 (!) 106 82 90  Resp: 18 20 18 20   Temp: 98.5 F (36.9 C) 98.3 F (36.8 C) 97.9 F (36.6 C) 97.6 F (36.4 C)  TempSrc: Oral Oral Oral Oral  SpO2: 96% 100% 99% 97%  Weight:      Height:        Physical Exam: Strength 5/5 x4 and SILTx4, incision c/d/I   Assessment & Plan: 57 y.o. male s/p C6-7 ACDF, recovering well.  -PT/OT -discharge home today  Emilee Hero, PA-C 07/27/22 9:10 AM

## 2022-07-27 NOTE — Discharge Instructions (Signed)
Wound Care Keep incision area clean and dry.  You may remove outer bandage after 2 days and shower.  Do not put any creams, lotions, or ointments on incision. Leave steri-strips on neck.  They will fall off by themselves. Activity Walk each and every day, increasing distance each day. No lifting greater than 5 lbs.  Avoid excessive neck motion. No driving for 2 weeks; may ride as a passenger locally. Wear neck brace at all times except when showering. Diet Resume your normal diet.  Return to Work Will be discussed at you follow up appointment. Call Your Doctor If Any of These Occur Redness, drainage, or swelling at the wound.  Temperature greater than 101 degrees. Severe pain not relieved by pain medication. Increased difficulty swallowing.  Incision starts to come apart. Follow Up Appt Call for appointment in 1-2 weeks ([phone]) or for problems.  If you have any hardware placed in your spine, you will need an x-ray before your appointment.

## 2022-07-27 NOTE — Discharge Summary (Signed)
Discharge Summary  Date of Admission: 07/26/2022  Date of Discharge: 07/27/22  Attending Physician: Marikay Alar, MD  Hospital Course: Patient was admitted following an uncomplicated C6-7 ACDF. They were recovered in PACU and transferred to [facility]. Their preop symptoms were improved, their hospital course was uncomplicated and the patient was discharged home today. They will follow up in clinic with Dr. Yetta Barre in clinic in 2 weeks.  Neurologic exam at discharge:  Strength 5/5 x4 and SILTx4, incision c/d/I   Discharge diagnosis: cervical radiculopathy   Iran Sizer, PA-C 07/27/22 9:11 AM

## 2022-07-27 NOTE — Progress Notes (Signed)
Patient is discharged from room 3C08 at this time. Alert and in stable condition. IV site d/c'd and instructions read to patient and spouse with understanding verbalized and all questions answered. Left unit via wheelchair with all belongings at side. 

## 2023-03-30 ENCOUNTER — Ambulatory Visit
Admission: EM | Admit: 2023-03-30 | Discharge: 2023-03-30 | Disposition: A | Discharge status: Home / Self Care | Attending: Nurse Practitioner | Admitting: Nurse Practitioner

## 2023-03-30 DIAGNOSIS — H6123 Impacted cerumen, bilateral: Secondary | ICD-10-CM

## 2023-03-30 NOTE — Discharge Instructions (Addendum)
Patient declined AVS 

## 2023-03-30 NOTE — ED Triage Notes (Addendum)
 Pt reports left ear fullness states he was cleaning his ear with a cotton swab this morning and it fell ike something was stuck in the ear.

## 2023-03-30 NOTE — ED Provider Notes (Signed)
 [facility]    CSN: [id number] Arrival date & time: 03/30/23  1442      History   Chief Complaint No chief complaint on file.   HPI Benjamin Solomon is a 58 y.o. male.   The history is provided by the patient.   Patient presents for complaints of decreased hearing, and possible wax buildup in the left ear.  Patient states he was cleaning his ear with a Q-tip earlier today.  He states when he finished, he cannot hear out of the left ear.  Patient denies fever, chills, ear pain, ear drainage, or upper respiratory symptoms.  Patient reports prior history of cerumen impaction.  Past Medical History:  Diagnosis Date   Anxiety    Arthritis    Benign prostatic hyperplasia with lower urinary tract symptoms    Carpal tunnel syndrome, right upper limb    Chronic back pain    Depression    Essential (primary) hypertension    Hemorrhoids    Hyperplastic colon polyp    Hypertension    Low back pain    Major depressive disorder, single episode, unspecified    Opioid dependence (HCC)    Pre-diabetes    Tubular adenoma    Unspecified osteoarthritis, unspecified site     Patient Active Problem List   Diagnosis Date Noted   S/P cervical spinal fusion 01/03/2021   Facial abscess 08/04/2020   Sepsis (HCC)    Essential (primary) hypertension    Low back pain    Major depressive disorder, single episode, unspecified    Benign prostatic hyperplasia with lower urinary tract symptoms    Carpal tunnel syndrome, right upper limb    Unspecified osteoarthritis, unspecified site    S/P lumbar spinal fusion 08/07/2016   Generalized anxiety disorder 10/04/2012   Alcohol dependence (HCC) 06/26/2012   Cocaine dependence (HCC) 06/26/2012   Depressive disorder, not elsewhere classified 06/26/2012   Bleeding external hemorrhoids 12/17/2011    Past Surgical History:  Procedure Laterality Date   ANTERIOR CERVICAL DECOMP/DISCECTOMY FUSION N/A 07/26/2022   Procedure: CERVICAL  SIX-SEVEN ANTERIOR CERVICAL DECOMPRESSION/DISCECTOMY FUSION WITH REMOVAL OF CERVICAL FIVE-SIX ANTERIOR HARDWARE;  Surgeon: Arman Bogus, MD;  Location: [facility] OR;  Service: Neurosurgery;  Laterality: N/A;   CARPAL TUNNEL RELEASE Right 02/17/2018   Procedure: CARPAL TUNNEL RELEASE;  Surgeon: Cindee Salt, MD;  Location: [facility];  Service: Orthopedics;  Laterality: Right;   CERVICAL SPINE SURGERY  2009   COLONOSCOPY     EVALUATION UNDER ANESTHESIA WITH HEMORRHOIDECTOMY  12/20/2011   FOOT SURGERY Right 2012   bone removed from the 5 th toe   INCISION AND DRAINAGE ABSCESS N/A 08/04/2020   Procedure: INCISION AND DRAINAGE ABSCESS;  Surgeon: Laren Boom, DO;  Location: [facility] OR;  Service: ENT;  Laterality: N/A;   NASAL ENDOSCOPY N/A 08/04/2020   Procedure: NASAL ENDOSCOPY;  Surgeon: Laren Boom, DO;  Location: [facility] OR;  Service: ENT;  Laterality: N/A;   POSTERIOR CERVICAL FUSION/FORAMINOTOMY N/A 01/03/2021   Procedure: Posterior cervical fusion with lateral mass fixation Cervical Three - Cervical seven;  Surgeon: Tia Alert, MD;  Location: [facility] OR;  Service: Neurosurgery;  Laterality: N/A;   TOOTH EXTRACTION N/A 08/04/2020   Procedure: DENTAL EXTRACTION TOOTH #11;  Surgeon: Enis Slipper, DMD;  Location: [facility] OR;  Service: Dentistry;  Laterality: N/A;       Home Medications    Prior to Admission medications   Medication Sig Start Date End Date Taking? Authorizing  Provider  amLODipine (NORVASC) 5 MG tablet Take 5 mg by mouth daily as needed (blood pressure of 150 or higher). 01/27/13   [provider]  ARIPiprazole (ABILIFY) 5 MG tablet Take 5 mg by mouth at bedtime. 03/20/20   [provider]  buPROPion (WELLBUTRIN XL) 150 MG 24 hr tablet Take 150 mg by mouth daily.    [provider]  buPROPion (WELLBUTRIN XL) 300 MG 24 hr tablet Take 300 mg by mouth daily. 05/18/16   [provider]  cyclobenzaprine (FLEXERIL) 10 MG tablet Take 1  tablet (10 mg total) by mouth 3 (three) times daily as needed for muscle spasms. 07/27/22   Cosentino, Talmadge Chad, PA-C  docusate sodium (COLACE) 100 MG capsule Take 100 mg by mouth daily as needed for moderate constipation.    [provider]  lisinopril-hydrochlorothiazide (ZESTORETIC) 20-25 MG tablet Take 1 tablet by mouth daily. 06/19/20   [provider]  oxyCODONE-acetaminophen (PERCOCET) 10-325 MG tablet Take 1 tablet by mouth every 4 (four) hours as needed for pain. 01/04/21   Meyran, Tiana Loft, NP    Family History Family History  Problem Relation Age of Onset   Hypertension Mother    Hypertension Father    Alcohol abuse Brother    Drug abuse Brother    Diabetes Neg Hx    Heart disease Neg Hx    Kidney disease Neg Hx    Liver disease Neg Hx     Social History Social History   Tobacco Use   Smoking status: Former    Current packs/day: 0.00    Average packs/day: 0.3 packs/day for 20.0 years (5.0 ttl pk-yrs)    Types: Cigarettes    Start date: 07/25/2000    Quit date: 07/25/2020    Years since quitting: 2.6   Smokeless tobacco: Never  Vaping Use   Vaping status: Never Used  Substance Use Topics   Alcohol use: No    Alcohol/week: 0.0 standard drinks of alcohol   Drug use: No     Allergies   Patient has no known allergies.   Review of Systems Review of Systems Per HPI  Physical Exam Triage Vital Signs ED Triage Vitals  Encounter Vitals Group     BP 03/30/23 1513 (!) 143/89     Systolic BP Percentile --      Diastolic BP Percentile --      Pulse Rate 03/30/23 1513 82     Resp 03/30/23 1513 18     Temp 03/30/23 1513 98 F (36.7 C)     Temp Source 03/30/23 1513 Oral     SpO2 03/30/23 1513 96 %     Weight --      Height --      Head Circumference --      Peak Flow --      Pain Score 03/30/23 1514 0     Pain Loc --      Pain Education --      Exclude from Growth Chart --    No data found.  Updated Vital Signs BP (!) 143/89 (BP  Location: Right Arm)   Pulse 82   Temp 98 F (36.7 C) (Oral)   Resp 18   SpO2 96%   Visual Acuity Right Eye Distance:   Left Eye Distance:   Bilateral Distance:    Right Eye Near:   Left Eye Near:    Bilateral Near:     Physical Exam Vitals and nursing note reviewed.  Constitutional:  Appearance: Normal appearance.  HENT:     Head: Normocephalic.     Right Ear: Ear canal and external ear normal. There is impacted cerumen.     Left Ear: Ear canal and external ear normal. There is impacted cerumen.     Nose: Nose normal.     Mouth/Throat:     Mouth: Mucous membranes are moist.  Eyes:     Extraocular Movements: Extraocular movements intact.     Pupils: Pupils are equal, round, and reactive to light.  Pulmonary:     Effort: Pulmonary effort is normal.  Musculoskeletal:     Cervical back: Normal range of motion.  Skin:    General: Skin is warm and dry.  Neurological:     General: No focal deficit present.     Mental Status: He is alert and oriented to person, place, and time.  Psychiatric:        Mood and Affect: Mood normal.        Behavior: Behavior normal.      UC Treatments / Results  Labs (all labs ordered are listed, but only abnormal results are displayed) Labs Reviewed - No data to display  EKG   Radiology No results found.  Procedures Procedures (including critical care time)  Medications Ordered in UC Medications - No data to display  Initial Impression / Assessment and Plan / UC Course  I have reviewed the triage vital signs and the nursing notes.  Pertinent labs & imaging results that were available during my care of the patient were reviewed by me and considered in my medical decision making (see chart for details).  Complete evacuation of the bilateral cerumen impaction performed.  Patient tolerated the procedure well.  Patient was advised to purchase over-the-counter Debrox earwax softener to use to prevent cerumen impaction.  Advised  patient to avoid use of Q-tips when cleaning the ears.  Patient also advised to follow-up in this clinic as needed for irrigation.  Patient was in agreement with this plan of care and verbalized understanding.  All questions were answered.  Patient stable for discharge.  Final Clinical Impressions(s) / UC Diagnoses   Final diagnoses:  Bilateral impacted cerumen     Discharge Instructions      Patient declined AVS.     ED Prescriptions   None    PDMP not reviewed this encounter.   Abran Cantor, NP 03/30/23 1557

## 2023-05-09 ENCOUNTER — Other Ambulatory Visit: Payer: Self-pay

## 2023-05-09 ENCOUNTER — Encounter: Payer: Self-pay | Admitting: Neurology

## 2023-05-09 DIAGNOSIS — R202 Paresthesia of skin: Secondary | ICD-10-CM

## 2023-06-17 ENCOUNTER — Ambulatory Visit: Admitting: Neurology

## 2023-06-17 DIAGNOSIS — R202 Paresthesia of skin: Secondary | ICD-10-CM | POA: Diagnosis not present

## 2023-06-17 DIAGNOSIS — G5602 Carpal tunnel syndrome, left upper limb: Secondary | ICD-10-CM

## 2023-06-17 DIAGNOSIS — M5412 Radiculopathy, cervical region: Secondary | ICD-10-CM

## 2023-06-17 NOTE — Procedures (Signed)
 [facility]  [address] [place], [address]  [place], [place] [address] Tel: [phone] Fax: [phone] Test Date:  06/17/2023  Patient: Benjamin Solomon DOB: 1965/09/26 Physician: Rommie Coats, MD  Sex: Male Height: 5\' 8"  Ref Phys: Waymond Hailey, MD  ID#: [id number]   Technician:    History: This is a 58 year old male with left hand pain and weakness.  NCV & EMG Findings: Extensive electrodiagnostic evaluation of the left upper limb with additional nerve conduction studies of the right upper limb shows: Left median sensory response shows prolonged distal peak latency (4.1 ms). Right median, right median-ulnar palmar, left ulnar, and left radial sensory responses are within normal limits. Left median (APB) and ulnar (ADM) motor responses are within normal limits. Chronic motor axon loss changes without accompanying active denervation changes are seen in the left biceps and deltoid muscles. Cervical paraspinal muscles were deferred due to prior cervical spine surgery.  Impression: This is an abnormal study. The findings are most consistent with the following: Evidence of a left median mononeuropathy at or distal to the wrist, consistent with carpal tunnel syndrome, mild in degree electrically. The residuals of an old intraspinal canal lesion (ie: motor radiculopathy) at the left C5 root or segment, mild in degree electrically. No electrodiagnostic evidence of a right median mononeuropathy at or distal to the wrist, ie: carpal tunnel syndrome. Screening tests for left ulnar or radial mononeuropathies are normal.   ___________________________ Rommie Coats, MD    Nerve Conduction Studies Motor Nerve Results    Latency Amplitude F-Lat Segment Distance CV Comment  Site (ms) Norm (mV) Norm (ms)  (cm) (m/s) Norm   Left Median (APB) Motor  Wrist 2.7  < 4.0 11.7  > 6.0        Elbow 8.2 - 11.6 -  Elbow-Wrist 30.5 55  > 50   Left Ulnar (ADM) Motor  Wrist 2.1  < 3.1 11.9  > 7.0         Bel elbow 6.2 - 10.9 -  Bel elbow-Wrist 23 56  > 50   Ab elbow 7.8 - 9.7 -  Ab elbow-Bel elbow 10 63 -    Sensory Sites    Neg Peak Lat Amplitude (O-P) Segment Distance Velocity Comment  Site (ms) Norm (V) Norm  (cm) (ms)   Left Median Sensory  Wrist-Dig II *4.1  < 3.6 21  > 15 Wrist-Dig II 13    Right Median Sensory  Wrist-Dig II 3.3  < 3.6 28  > 15 Wrist-Dig II 13    Right Median-Ulnar Palmar Sensory       Median  Palm-Wrist 2.1  < 2.2 30  > 10 Palm-Wrist 8         Ulnar  Palm-Wrist 1.85  < 2.2 19  > 5 Palm-Wrist 8    Left Radial Sensory  Forearm-Wrist 2.2  < 2.7 24  > 14 Forearm-Wrist 10    Left Ulnar Sensory  Wrist-Dig V 2.9  < 3.1 29  > 10 Wrist-Dig V 11     Inter-Nerve Comparisons   Nerve 1 Value 1 Nerve 2 Value 2 Parameter Result Normal  Sensory Sites  R Median Palm-Wrist 2.1 ms R Ulnar Palm-Wrist 1.85 ms Peak Lat Diff 0.25 ms <0.40   Electromyography   Side Muscle Ins.Act Fibs Fasc Recrt Amp Dur Poly Activation Comment  Left FDI Nml Nml Nml Nml Nml Nml Nml Nml N/A  Left EIP Nml Nml Nml Nml Nml Nml Nml Nml N/A  Left Pronator teres Nml Nml Nml Nml Nml Nml Nml Nml N/A  Left APB Nml Nml Nml Nml Nml Nml Nml Nml N/A  Left Biceps Nml Nml Nml *1- *1+ *1+ Nml Nml N/A  Left Triceps Nml Nml Nml Nml Nml Nml Nml Nml N/A  Left Deltoid Nml Nml Nml *1- *1+ *1+ Nml Nml N/A      Waveforms:  Motor      Sensory

## 2023-08-07 IMAGING — CT CT CERVICAL SPINE W/O CM
3 of 6 series · 12 of 33 positions shown, 13 images · IV contrast (OMNIPAQUE 350)
Comparison: 07/08/2020

CLINICAL DATA: Neck pain and bleeding after recent cervical spinal
fusion.

EXAM:
CT NECK WITH CONTRAST
CT CERVICAL SPINE WITHOUT CONTRAST
TECHNIQUE: Multidetector CT imaging of the neck was performed using the
standard protocol following the bolus administration of intravenous
contrast. Multi detector CT imaging of the cervical spine was
performed using the standard protocol without using additional
intravenous contrast.
CONTRAST:  80mL OMNIPAQUE IOHEXOL 350 MG/ML SOLN

[Series 7: ax c-spine st orthgonal · axial · 0.24mm/px · z∈[+1233,+1384]mm · 4 of 347 slices shown, 5 images]
[im 44/347  soft-tissue]
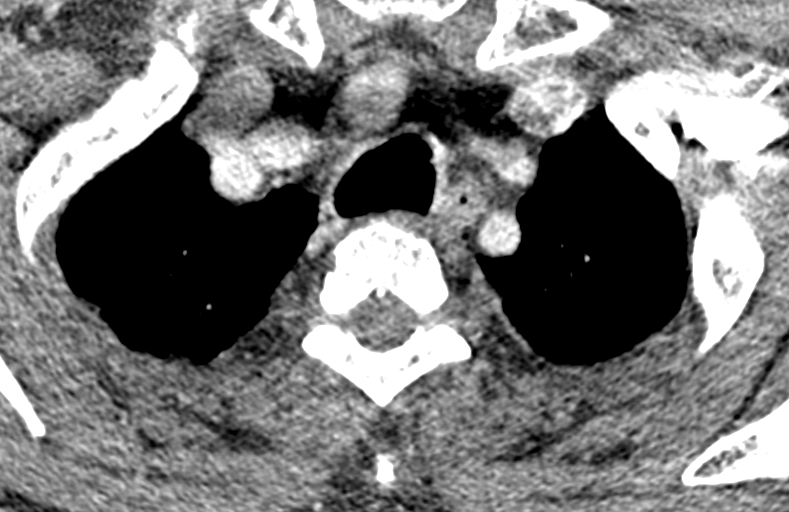
[im 44/347  bone]
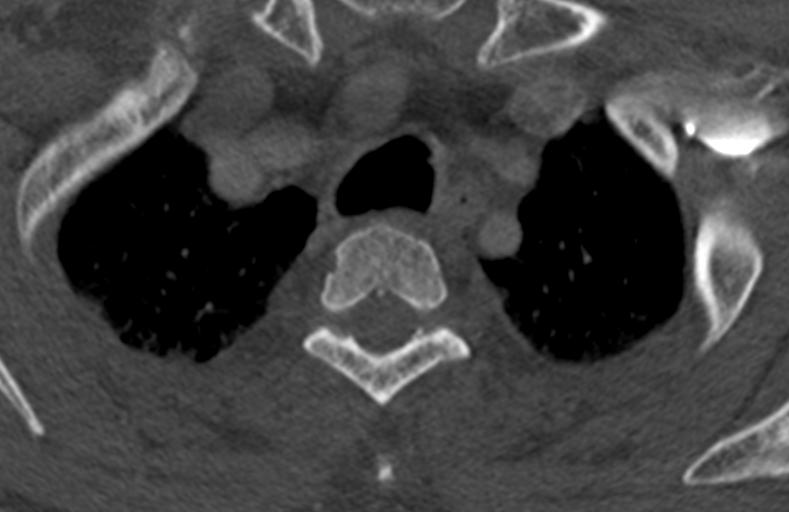
[im 130/347  bone]
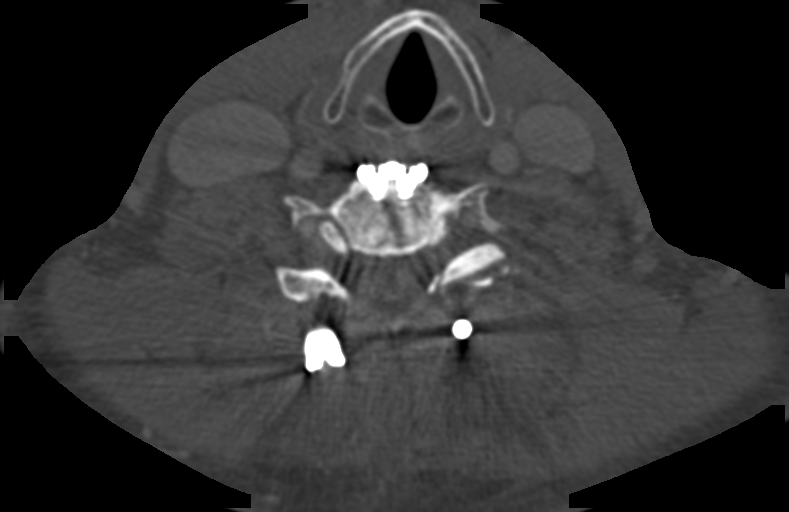
[im 217/347  bone]
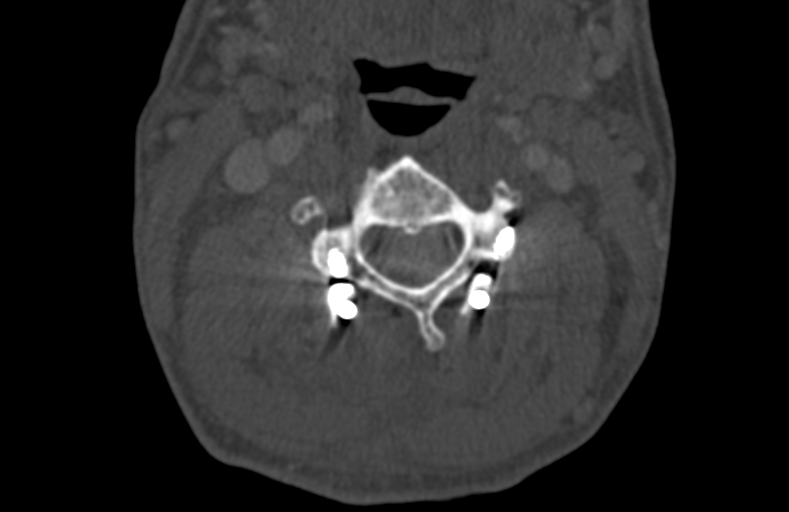
[im 303/347  bone]
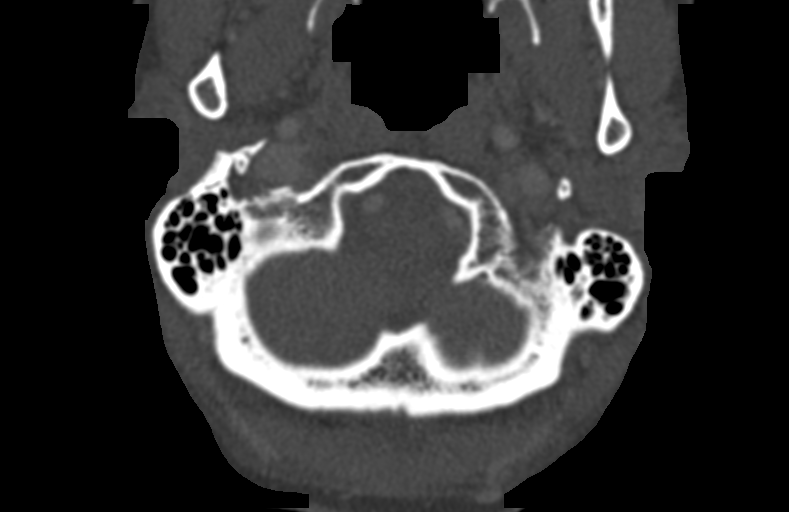

[Series 8: ax c-spine st coronal · coronal · 0.37mm/px · 3 of 207 slices shown (1 of 2)]
[im 42/207  bone]
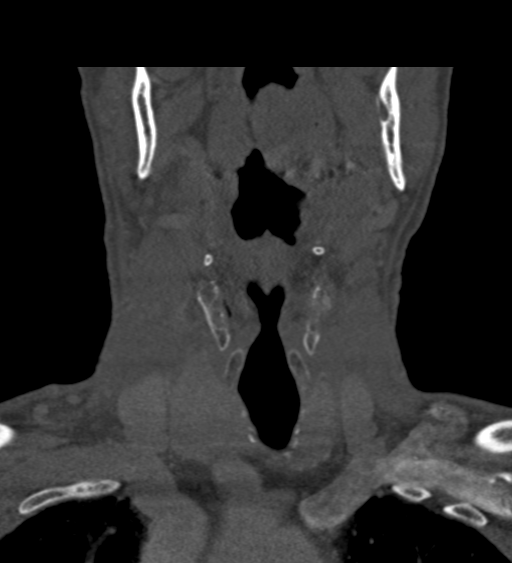
[im 83/207  bone]
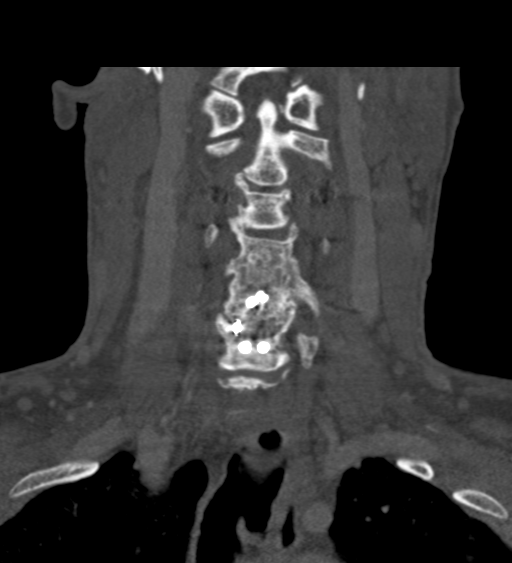
[im 124/207  bone]
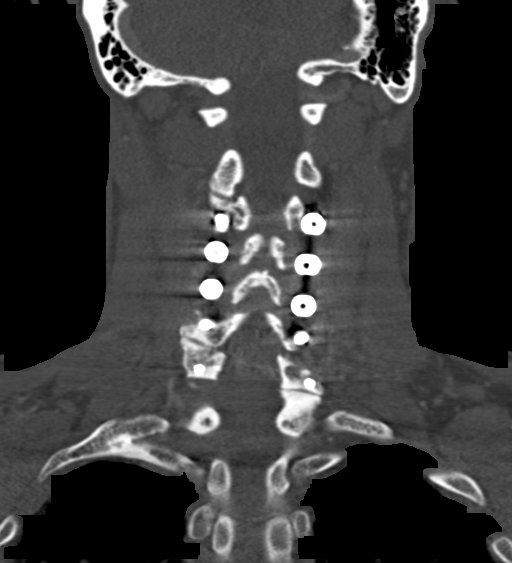

[Series 9: ax c-spine st coronal · sagittal · 0.35mm/px · 5 of 339 slices shown (2 of 2)]
[im 93/339  bone]
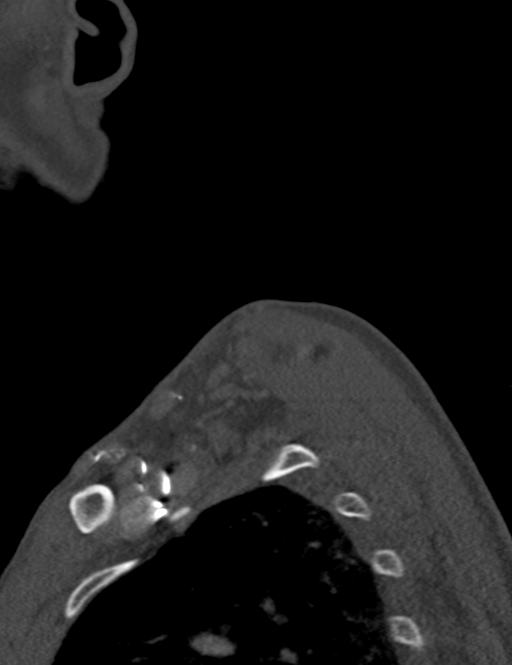
[im 123/339  bone]
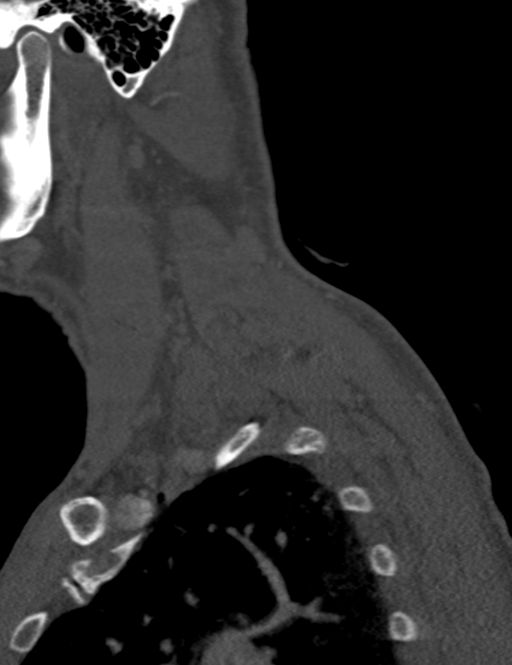
[im 154/339  bone]
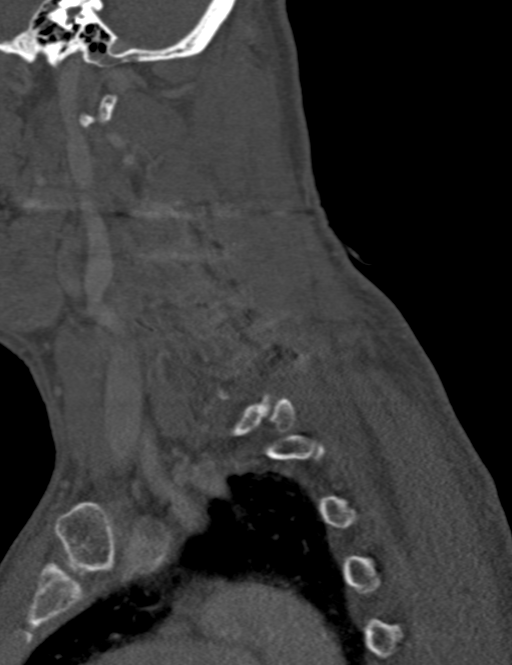
[im 185/339  bone]
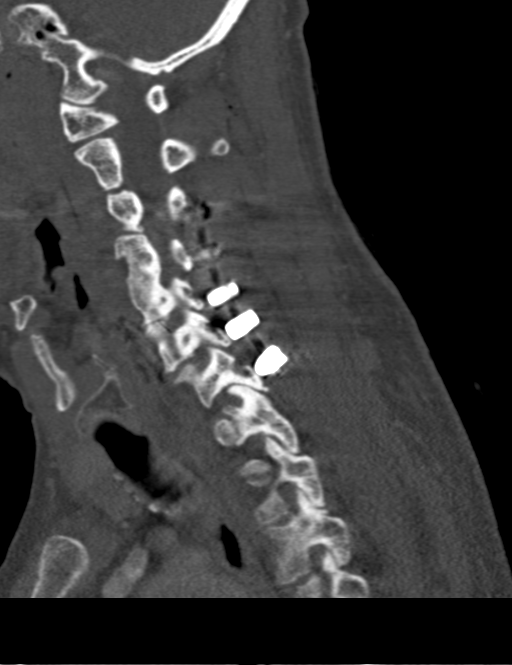
[im 216/339  bone]
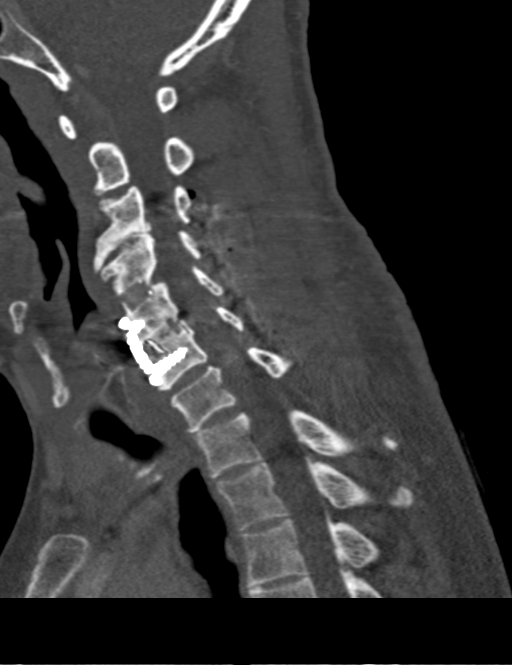

[12 of 33 positions shown; findings below may reference images not displayed]

FINDINGS: CT NECK FINDINGS

PHARYNX AND LARYNX: The nasopharynx, oropharynx and larynx are
normal. Visible portions of the oral cavity, tongue base and floor
of mouth are normal. Normal epiglottis, vallecula and pyriform
sinuses. The larynx is normal. No retropharyngeal abscess, effusion
or lymphadenopathy.

SALIVARY GLANDS: Normal parotid, submandibular and sublingual
glands.

THYROID: Normal.

LYMPH NODES: No enlarged or abnormal density lymph nodes.

VASCULAR: Major cervical vessels are patent.

LIMITED INTRACRANIAL: Normal.

VISUALIZED ORBITS: Normal.

MASTOIDS AND VISUALIZED PARANASAL SINUSES: No fluid levels or
advanced mucosal thickening. No mastoid effusion.

UPPER CHEST: Clear.

OTHER: None.

CT CERVICAL SPINE FINDINGS

Alignment: There is grade 1 retrolisthesis at C3-4. Alignment is
otherwise normal.

Skull base and vertebrae: Unchanged anterior fusion at C4-6 with
ACDF hardware at C5-6. There is now posterior instrumented fusion at
C3-7. No hardware abnormality.

Soft tissues and spinal canal: There is mild postoperative gas and
soft tissue edema in the dorsal paraspinous tissues, but no
unexpected finding considering recent surgery.

Disc levels: No advanced spinal canal or neural foraminal stenosis.

Upper chest: No pneumothorax, pulmonary nodule or pleural effusion.

Other: Normal visualized paraspinal cervical soft tissues.
IMPRESSION: 1. No acute abnormality of the neck.
2. Expected postoperative appearance of the dorsal paraspinous
tissues without unexpected finding considering recent surgery.

## 2023-08-07 IMAGING — CT CT NECK W/ CM
3 of 4 series · 13 of 33 positions shown, 16 images · IV contrast (OMNIPAQUE 350)
Comparison: 07/08/2020

CLINICAL DATA: Neck pain and bleeding after recent cervical spinal
fusion.

EXAM:
CT NECK WITH CONTRAST
CT CERVICAL SPINE WITHOUT CONTRAST
TECHNIQUE: Multidetector CT imaging of the neck was performed using the
standard protocol following the bolus administration of intravenous
contrast. Multi detector CT imaging of the cervical spine was
performed using the standard protocol without using additional
intravenous contrast.
CONTRAST:  80mL OMNIPAQUE IOHEXOL 350 MG/ML SOLN

[Series 1: axial neck · axial · 0.47mm/px · z∈[+1227,+1387]mm · 5 of 120 slices shown, 7 images]
[im 20/120  soft-tissue]
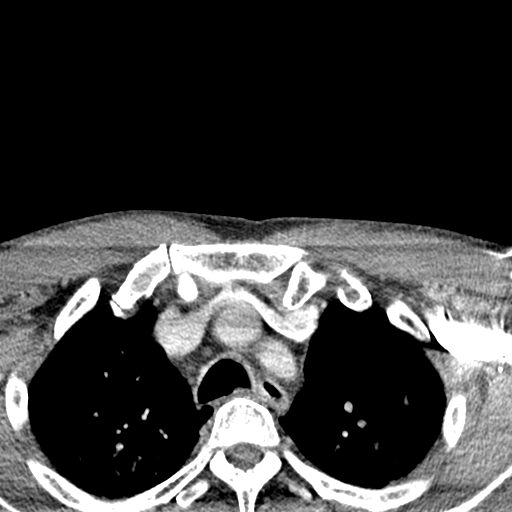
[im 20/120  bone]
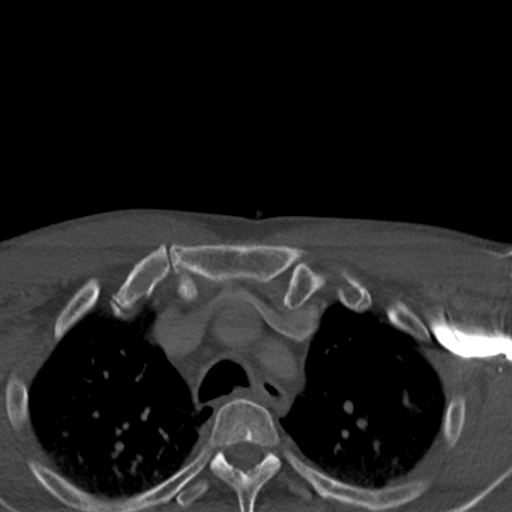
[im 40/120  bone]
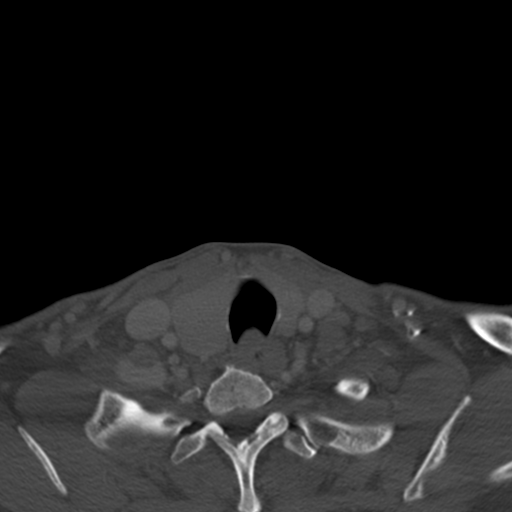
[im 60/120  bone]
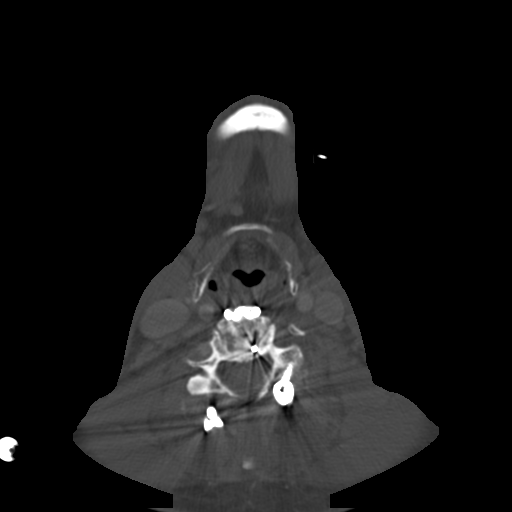
[im 80/120  bone]
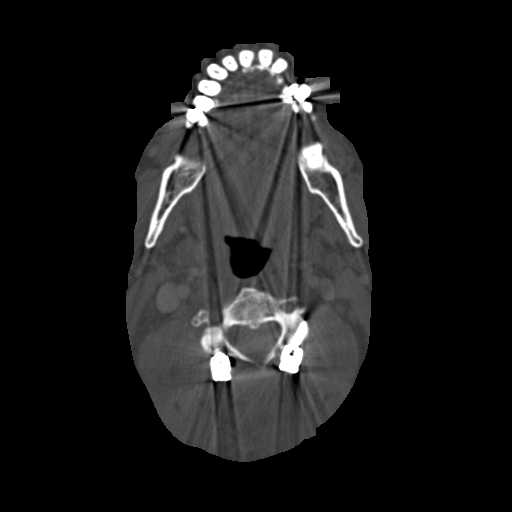
[im 100/120  soft-tissue]
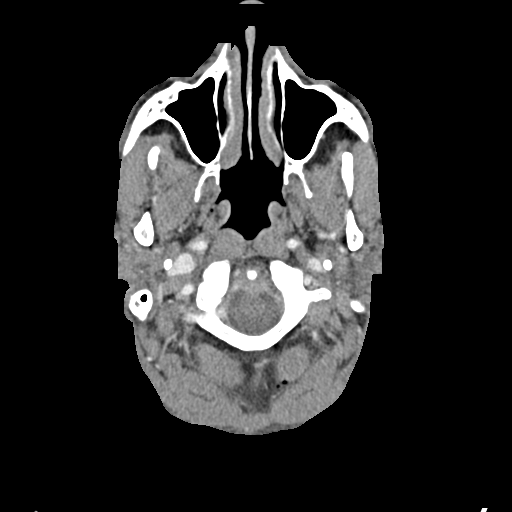
[im 100/120  bone]
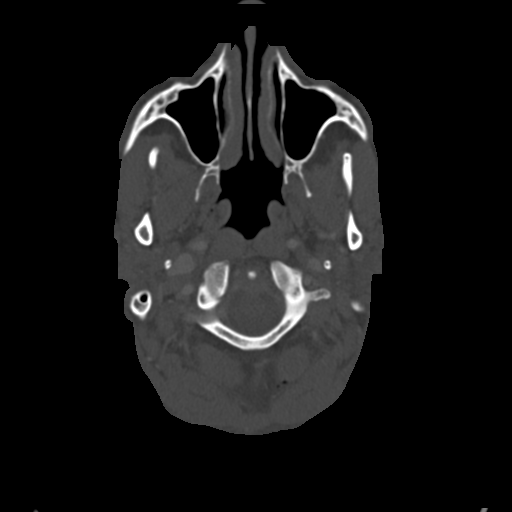

[Series 7: coronal · coronal · 0.40mm/px · 3 of 107 slices shown]
[im 36/107  bone]
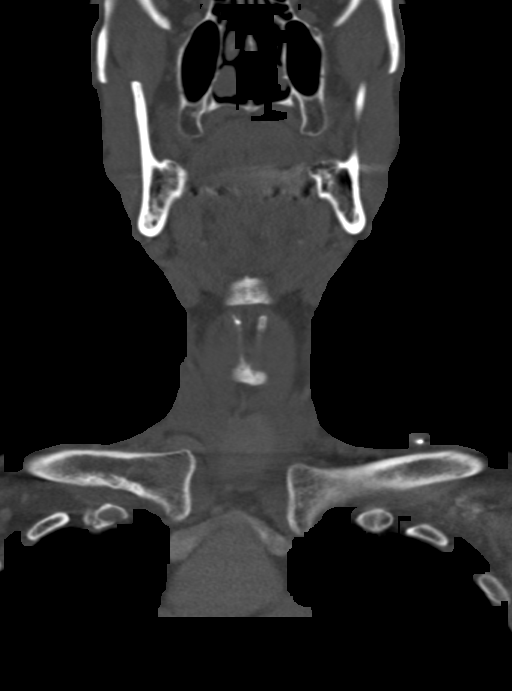
[im 48/107  bone]
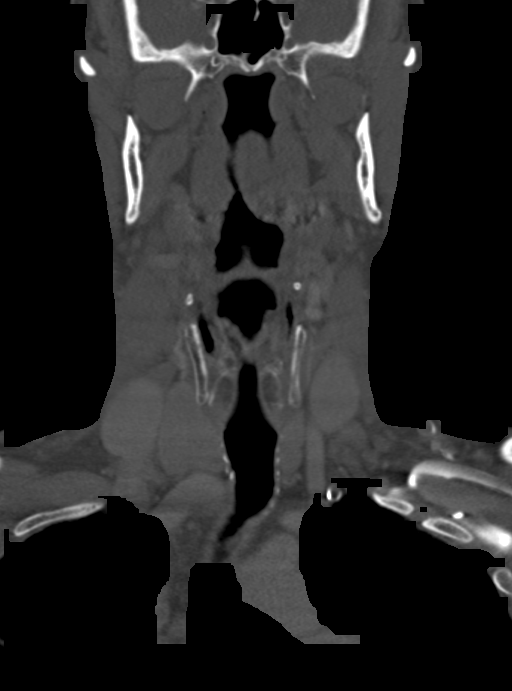
[im 60/107  bone]
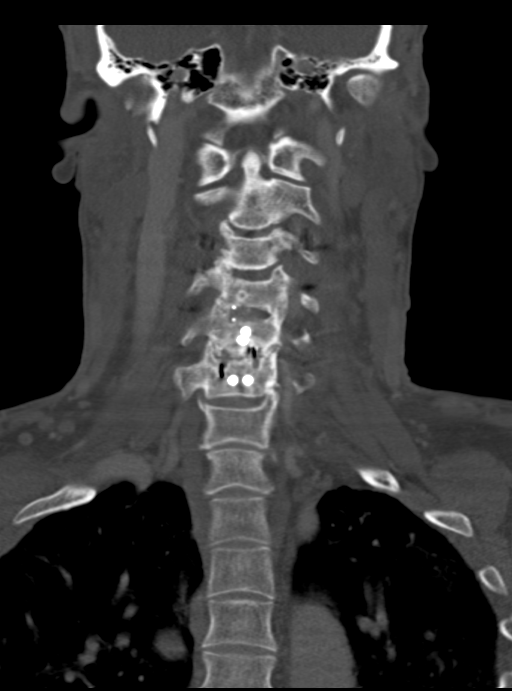

[Series 8: sagittal · sagittal · 0.48mm/px · 5 of 101 slices shown, 6 images]
[im 34/101  bone]
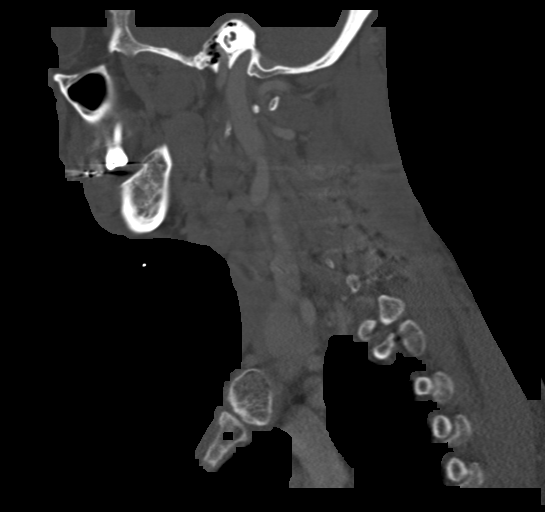
[im 42/101  bone]
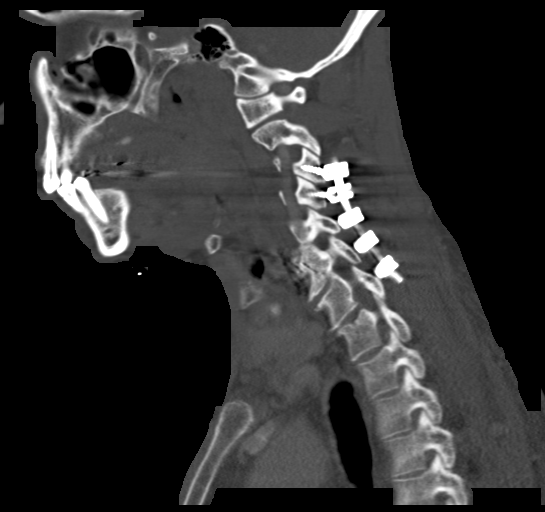
[im 51/101  soft-tissue]
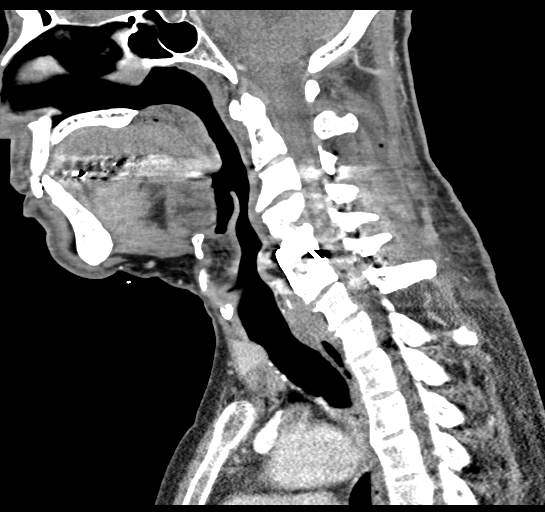
[im 51/101  bone]
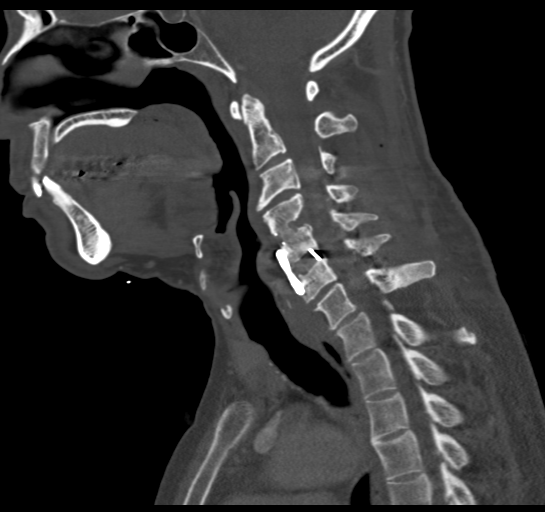
[im 59/101  bone]
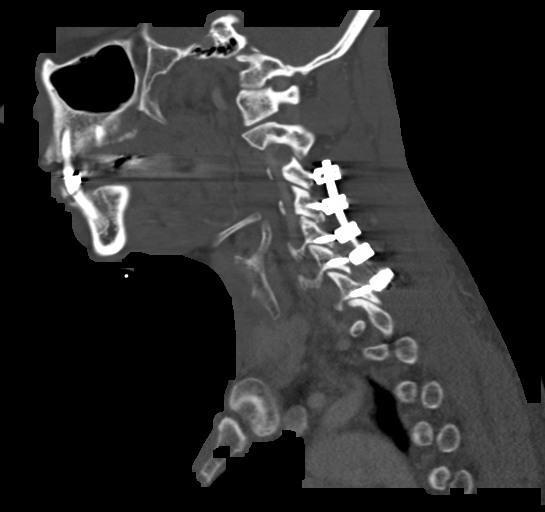
[im 67/101  bone]
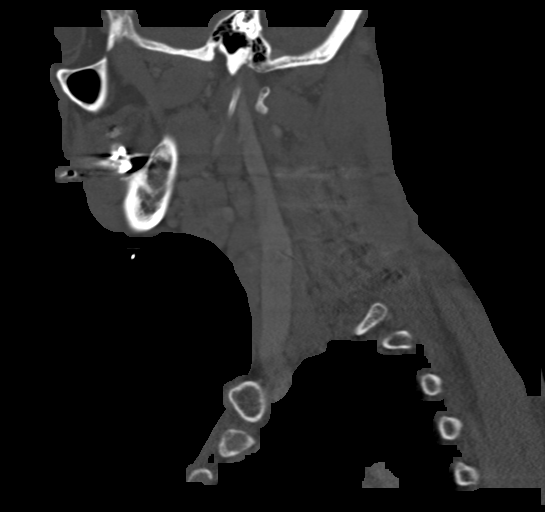

[13 of 33 positions shown; findings below may reference images not displayed]

FINDINGS: CT NECK FINDINGS

PHARYNX AND LARYNX: The nasopharynx, oropharynx and larynx are
normal. Visible portions of the oral cavity, tongue base and floor
of mouth are normal. Normal epiglottis, vallecula and pyriform
sinuses. The larynx is normal. No retropharyngeal abscess, effusion
or lymphadenopathy.

SALIVARY GLANDS: Normal parotid, submandibular and sublingual
glands.

THYROID: Normal.

LYMPH NODES: No enlarged or abnormal density lymph nodes.

VASCULAR: Major cervical vessels are patent.

LIMITED INTRACRANIAL: Normal.

VISUALIZED ORBITS: Normal.

MASTOIDS AND VISUALIZED PARANASAL SINUSES: No fluid levels or
advanced mucosal thickening. No mastoid effusion.

UPPER CHEST: Clear.

OTHER: None.

CT CERVICAL SPINE FINDINGS

Alignment: There is grade 1 retrolisthesis at C3-4. Alignment is
otherwise normal.

Skull base and vertebrae: Unchanged anterior fusion at C4-6 with
ACDF hardware at C5-6. There is now posterior instrumented fusion at
C3-7. No hardware abnormality.

Soft tissues and spinal canal: There is mild postoperative gas and
soft tissue edema in the dorsal paraspinous tissues, but no
unexpected finding considering recent surgery.

Disc levels: No advanced spinal canal or neural foraminal stenosis.

Upper chest: No pneumothorax, pulmonary nodule or pleural effusion.

Other: Normal visualized paraspinal cervical soft tissues.
IMPRESSION: 1. No acute abnormality of the neck.
2. Expected postoperative appearance of the dorsal paraspinous
tissues without unexpected finding considering recent surgery.
